# Patient Record
Sex: Female | Born: 1965
Health system: Southern US, Community
[De-identification: ages and names within clinical notes are randomized; demographics above are authoritative.]

## PROBLEM LIST (undated history)

## (undated) DIAGNOSIS — R112 Nausea with vomiting, unspecified: Secondary | ICD-10-CM

## (undated) DIAGNOSIS — R509 Fever, unspecified: Secondary | ICD-10-CM

## (undated) DIAGNOSIS — E7800 Pure hypercholesterolemia, unspecified: Secondary | ICD-10-CM

## (undated) DIAGNOSIS — R519 Headache, unspecified: Secondary | ICD-10-CM

## (undated) DIAGNOSIS — E119 Type 2 diabetes mellitus without complications: Secondary | ICD-10-CM

## (undated) DIAGNOSIS — Z9889 Other specified postprocedural states: Secondary | ICD-10-CM

## (undated) DIAGNOSIS — I10 Essential (primary) hypertension: Secondary | ICD-10-CM

## (undated) DIAGNOSIS — F319 Bipolar disorder, unspecified: Secondary | ICD-10-CM

## (undated) DIAGNOSIS — Z6841 Body Mass Index (BMI) 40.0 and over, adult: Secondary | ICD-10-CM

## (undated) DIAGNOSIS — K219 Gastro-esophageal reflux disease without esophagitis: Secondary | ICD-10-CM

## (undated) DIAGNOSIS — R51 Headache: Secondary | ICD-10-CM

## (undated) DIAGNOSIS — M1990 Unspecified osteoarthritis, unspecified site: Secondary | ICD-10-CM

## (undated) DIAGNOSIS — N904 Leukoplakia of vulva: Secondary | ICD-10-CM

## (undated) HISTORY — DX: Body Mass Index (BMI) 40.0 and over, adult: Z684

## (undated) HISTORY — DX: Leukoplakia of vulva: N90.4

## (undated) HISTORY — DX: Morbid (severe) obesity due to excess calories: E66.01

## (undated) HISTORY — DX: Unspecified osteoarthritis, unspecified site: M19.90

## (undated) HISTORY — PX: WISDOM TOOTH EXTRACTION: SHX21

## (undated) HISTORY — PX: HERNIA REPAIR: SHX51

## (undated) HISTORY — DX: Bipolar disorder, unspecified: F31.9

## (undated) HISTORY — PX: TUBAL LIGATION: SHX77

---

## 1992-05-30 HISTORY — PX: APPENDECTOMY: SHX54

## 1998-01-10 ENCOUNTER — Emergency Department (HOSPITAL_COMMUNITY): Admission: EM | Admit: 1998-01-10 | Discharge: 1998-01-10 | Payer: Self-pay | Admitting: Emergency Medicine

## 2000-02-23 ENCOUNTER — Encounter: Payer: Self-pay | Admitting: Family Medicine

## 2000-02-23 ENCOUNTER — Encounter: Admission: RE | Admit: 2000-02-23 | Discharge: 2000-02-23 | Payer: Self-pay | Admitting: Family Medicine

## 2000-03-08 ENCOUNTER — Other Ambulatory Visit: Admission: RE | Admit: 2000-03-08 | Discharge: 2000-03-22 | Payer: Self-pay | Admitting: Psychiatry

## 2000-05-31 ENCOUNTER — Encounter: Payer: Self-pay | Admitting: Family Medicine

## 2000-05-31 ENCOUNTER — Encounter: Admission: RE | Admit: 2000-05-31 | Discharge: 2000-05-31 | Payer: Self-pay | Admitting: Family Medicine

## 2000-06-07 ENCOUNTER — Encounter: Admission: RE | Admit: 2000-06-07 | Discharge: 2000-06-07 | Payer: Self-pay | Admitting: Family Medicine

## 2000-06-07 ENCOUNTER — Encounter: Payer: Self-pay | Admitting: Family Medicine

## 2002-02-06 ENCOUNTER — Encounter: Payer: Self-pay | Admitting: Emergency Medicine

## 2002-02-06 ENCOUNTER — Emergency Department (HOSPITAL_COMMUNITY): Admission: EM | Admit: 2002-02-06 | Discharge: 2002-02-06 | Payer: Self-pay | Admitting: Emergency Medicine

## 2002-02-11 ENCOUNTER — Encounter: Payer: Self-pay | Admitting: Family Medicine

## 2002-02-11 ENCOUNTER — Ambulatory Visit (HOSPITAL_COMMUNITY): Admission: RE | Admit: 2002-02-11 | Discharge: 2002-02-11 | Payer: Self-pay | Admitting: Family Medicine

## 2002-11-19 ENCOUNTER — Encounter: Admission: RE | Admit: 2002-11-19 | Discharge: 2002-11-19 | Payer: Self-pay | Admitting: Family Medicine

## 2002-11-19 ENCOUNTER — Encounter: Payer: Self-pay | Admitting: Family Medicine

## 2003-07-14 ENCOUNTER — Ambulatory Visit (HOSPITAL_COMMUNITY): Admission: RE | Admit: 2003-07-14 | Discharge: 2003-07-14 | Payer: Self-pay | Admitting: Gastroenterology

## 2004-02-16 ENCOUNTER — Encounter: Admission: RE | Admit: 2004-02-16 | Discharge: 2004-02-16 | Payer: Self-pay | Admitting: Family Medicine

## 2004-02-16 ENCOUNTER — Emergency Department (HOSPITAL_COMMUNITY): Admission: EM | Admit: 2004-02-16 | Discharge: 2004-02-16 | Payer: Self-pay | Admitting: Emergency Medicine

## 2004-03-19 ENCOUNTER — Encounter: Admission: RE | Admit: 2004-03-19 | Discharge: 2004-03-19 | Payer: Self-pay | Admitting: General Surgery

## 2004-04-12 ENCOUNTER — Ambulatory Visit (HOSPITAL_BASED_OUTPATIENT_CLINIC_OR_DEPARTMENT_OTHER): Admission: RE | Admit: 2004-04-12 | Discharge: 2004-04-12 | Payer: Self-pay | Admitting: General Surgery

## 2007-06-21 ENCOUNTER — Emergency Department (HOSPITAL_COMMUNITY): Admission: EM | Admit: 2007-06-21 | Discharge: 2007-06-21 | Payer: Self-pay | Admitting: Emergency Medicine

## 2008-02-28 ENCOUNTER — Ambulatory Visit (HOSPITAL_COMMUNITY): Admission: RE | Admit: 2008-02-28 | Discharge: 2008-02-28 | Payer: Self-pay | Admitting: Obstetrics and Gynecology

## 2008-02-28 ENCOUNTER — Encounter (INDEPENDENT_AMBULATORY_CARE_PROVIDER_SITE_OTHER): Payer: Self-pay | Admitting: Obstetrics and Gynecology

## 2008-09-16 ENCOUNTER — Ambulatory Visit (HOSPITAL_COMMUNITY): Admission: RE | Admit: 2008-09-16 | Discharge: 2008-09-16 | Payer: Self-pay | Admitting: General Surgery

## 2009-01-13 ENCOUNTER — Emergency Department (HOSPITAL_COMMUNITY): Admission: EM | Admit: 2009-01-13 | Discharge: 2009-01-13 | Payer: Self-pay | Admitting: Emergency Medicine

## 2009-01-28 ENCOUNTER — Ambulatory Visit (HOSPITAL_COMMUNITY): Admission: RE | Admit: 2009-01-28 | Discharge: 2009-01-29 | Payer: Self-pay | Admitting: General Surgery

## 2009-03-24 ENCOUNTER — Encounter: Admission: RE | Admit: 2009-03-24 | Discharge: 2009-03-24 | Payer: Self-pay | Admitting: Podiatry

## 2010-01-28 ENCOUNTER — Ambulatory Visit (HOSPITAL_COMMUNITY): Admission: RE | Admit: 2010-01-28 | Discharge: 2010-01-28 | Payer: Self-pay | Admitting: Obstetrics and Gynecology

## 2010-08-12 LAB — CBC
Hemoglobin: 14.1 g/dL (ref 12.0–15.0)
RDW: 12.8 % (ref 11.5–15.5)
WBC: 6.8 10*3/uL (ref 4.0–10.5)

## 2010-09-04 LAB — CBC
Hemoglobin: 13.4 g/dL (ref 12.0–15.0)
MCHC: 33.9 g/dL (ref 30.0–36.0)
MCV: 92.1 fL (ref 78.0–100.0)
Platelets: 338 10*3/uL (ref 150–400)
RBC: 4.31 MIL/uL (ref 3.87–5.11)

## 2010-09-04 LAB — URINE MICROSCOPIC-ADD ON

## 2010-09-04 LAB — URINALYSIS, ROUTINE W REFLEX MICROSCOPIC
Bilirubin Urine: NEGATIVE
Leukocytes, UA: NEGATIVE
Nitrite: NEGATIVE
Protein, ur: NEGATIVE mg/dL

## 2010-10-12 NOTE — Op Note (Signed)
NAMEJILIAN, Suzanne Gutierrez              ACCOUNT NO.:  [id number]   MEDICAL RECORD NO.:  [id number]          PATIENT TYPE:  OIB   LOCATION:  5118                         FACILITY:  [facility]   PHYSICIAN:  Gabrielle Dare. Janee Morn, M.D.DATE OF BIRTH:  1965-09-17   DATE OF PROCEDURE:  01/28/2009  DATE OF DISCHARGE:                               OPERATIVE REPORT   PREOPERATIVE DIAGNOSIS:  Recurrent umbilical hernia.   POSTOPERATIVE DIAGNOSIS:  Recurrent umbilical hernia.   PROCEDURE:  Laparoscopic repair of recurrent umbilical hernia with mesh  using 12-cm Peritek dual.   SURGEON:  Gabrielle Dare. Janee Morn, MD   ASSISTANT:  Magnus Ivan, RNFA   ANESTHESIA:  General endotracheal.   HISTORY OF PRESENT ILLNESS:  [name] is a 45 year old female who  underwent umbilical hernia repair with mesh on April 12, 2004.  Early  this spring, she began having some pain just to the right side of her  previous hernia repair.  I evaluated her in the office and subsequently  obtained CT scan which demonstrated a periumbilical abdominal wall  hernia containing some omentum.  She presents today for laparoscopic  repair of this recurrent hernia with mesh.   PROCEDURE IN DETAIL:  The patient was identified in the preop holding  area.  Her site was identified.  She received intravenous antibiotics.  Informed consent was obtained.  She was brought to the operating room.  General endotracheal anesthesia was administered by the Anesthesia  staff.  A Foley catheter was placed by the nursing staff.  Her abdomen  was prepped and draped in a sterile fashion including an [place].  We did  a time-out procedure.  Marcaine 0.25% with epinephrine was injected in  the left upper lateral abdomen.  Subcutaneous tissues were dissected  down.  We used an OptiView 5-mm port to gain entry to the abdomen very  carefully.  This was done without difficulty.  The abdomen was  insufflated with carbon dioxide in standard fashion.  The scope  was  reinserted in the area where we entered the abdomen was visualized.  There was no apparent injury.  No complications.  Next, under direct  vision, an 11-mm left lower quadrant and a 5-mm right midabdomen lateral  port were placed.  Marcaine 0.25% with epinephrine was used at all port  sites.  Next, the abdomen was explored.  The hernia defect was in the  periumbilical region.  There was omentum up into it, and there was no  bowel.  The omentum was gradually withdrawn and brought down and  hemostasis was obtained in the omentum.  The distal portion was trimmed  off the adhesions up inside and hemostasis was obtained there.  There  was no evidence of chronic infection or other complicating features  aside from the recurrent hernia.  Next, the hernia defect was measured  out using a spinal needle and we chose a 12-cm round Peritek dual mesh  to accomplish the repair, just we will leave at least 4 cm of  circumferential overlap beyond the hernia defect in all direction.  The  4 lengths of 0 Novafil were placed  at 12 o'clock, 3 o'clock, 6 o'clock,  and 9 o'clock.  The mesh was marked.  The mesh was soaked and rolled up  and inserted into the abdomen via  the 11-mm port.  The mesh was  unfurled and orientation was sent.  Next, small nicks were made with a  11-blade scalpel at the corresponding sutures locations and the  EndoCatch was used to pass through the fascia under direct vision and  grabbed each end of the sutures and pulled them up to the abdominal  wall.  A new pass was made for each limb of the suture and this went  fine with exception of the 12 o'clock position when the suture broke  about midway down so that was left in place and just the other arm and  suture were pulled up against the abdominal wall and held with a  hemostat.  Next, all 3 other sutures were tied securely.  The mesh was  then tacked up to the anterior abdominal wall with the Covidien  AbsorbaTack.  Two  concentric rings of tacks were placed giving excellent  lay of the mesh.  It was nice and flat with excellent coverage of the  defect.  Next, a suture passer was used at the 12 o'clock position to  pass a new 0 Novafil down through the Peritek and then a second pass was  used to grasp it and this was tied securely giving additional fixation  to the mass.  Mesh was reinspected and our 2 concentric rings of  AbsorbaTack looked good in good position.  The omentum and rest of the  abdomen were rechecked.  There was no bleeding or other complicating  features.  Pneumoperitoneum was released and ports were removed under  direct vision.  The [place] was removed.  The 3 port sites were closed  with running 4-0 Vicryl subcuticular stitch and then all wounds  including the stab sites for the suture were closed with Dermabond.  Sponge, needle, and instrument counts were all correct.  The patient  tolerated the procedure well without apparent complication.  He was  taken to the recovery in stable condition.      Gabrielle Dare Janee Morn, M.D.  Electronically Signed     BET/MEDQ  D:  01/28/2009  T:  01/29/2009  Job:  [id number]

## 2010-10-12 NOTE — Op Note (Signed)
NAMEKRYSTEL, Suzanne Gutierrez NO.:  [id number]   MEDICAL RECORD NO.:  [id number]          PATIENT TYPE:  AMB   LOCATION:  SDC                           FACILITY:  [facility]   PHYSICIAN:  Malva Limes, M.D.    DATE OF BIRTH:  August 21, 1965   DATE OF PROCEDURE:  02/28/2008  DATE OF DISCHARGE:                               OPERATIVE REPORT   PREOPERATIVE DIAGNOSIS:  Menorrhagia.   POSTOPERATIVE DIAGNOSIS:  Menorrhagia.   PROCEDURES:  1. Dilation and curettage.  2. NovaSure endometrial ablation.   SURGEON:  Malva Limes, MD   ANESTHESIA:  General.   ANTIBIOTICS:  Ancef 1 g.   DRAINS:  Red rubber catheter bladder.   SPECIMENS:  Endometrial curettings sent to Pathology.   ESTIMATED BLOOD LOSS:  Minimal.   COMPLICATIONS:  None.   PROCEDURE:  The patient was taken to the operating room where general  anesthetic was administered without difficulty.  The patient was then  placed in a dorsal lithotomy position.  She was prepped and draped in  the usual fashion for this procedure.  An exam under anesthesia revealed  an anteverted uterus of normal size and shape with no adnexal masses.  A  sterile speculum was placed in the vagina.  12 mL of 1% lidocaine was  used for paracervical block.  A single-tooth tenaculum was applied to  the anterior cervical lip.  The cervix was then serially dilated to a 52-  [place].  The uterus was sounded to 10 cm.  The cervical length was 3.5  cm giving a cavitary length of 6.5 cm.  The sharp curettage was then  performed and tissue sent to Pathology.  Next, the NovaSure device was  placed into the uterine cavity and opened, the width was 4.5 cm.  After  the device was properly placed, the Seal test was performed and passed.  Device was then turned on for a total of 70 seconds using 161 watts  power.  At this point,  the procedure was concluded.  The instruments and device was removed.  The patient was awakened and taken to recovery room in  stable condition.  Instrument and lap counts were correct x1.  The patient will be  discharged to home.  She will be sent home with Percocet to take p.r.n.  She will follow up in the office in 4 weeks.           ______________________________  Malva Limes, M.D.     MA/MEDQ  D:  02/28/2008  T:  02/28/2008  Job:  [id number]

## 2010-10-15 NOTE — Op Note (Signed)
NAME:  Suzanne Gutierrez, Suzanne Gutierrez                        ACCOUNT NO.:  [id number]   MEDICAL RECORD NO.:  [id number]                   PATIENT TYPE:  AMB   LOCATION:  ENDO                                 FACILITY:  [facility]   PHYSICIAN:  James L. Malon Kindle., M.D.          DATE OF BIRTH:  1966/04/13   DATE OF PROCEDURE:  07/14/2003  DATE OF DISCHARGE:                                 OPERATIVE REPORT   PROCEDURE:  Esophagogastroduodenoscopy and biopsy.   ENDOSCOPIST:  Llana Aliment. Randa Evens, M.D.   MEDICATIONS:  Cetacaine spray and Fentanyl 100 mcg and Versed 5 mg IV.   INDICATIONS FOR PROCEDURE:  Right upper quadrant pain.  She has had a  negative CT ultrasound.   DESCRIPTION OF PROCEDURE:  The procedure had been explained to the patient  and consent obtained.  With the patient in the left lateral decubitus  position the Olympus scope was inserted and advanced.  The stomach was  entered first and then passed through the duodenum including the bulb and  second portion which were seen well.  They were normal other than mild  duodenitis of the duodenal bulb.  There were no ulcerations.  The scope was  withdrawn back into the stomach.  The pyloric channel was normal.  No  ulcerations were seen in the stomach.  A biopsy was taken for rapid urease  testing and Helicobacter.  The scope was withdrawn.  The fundus and cardia  were seen in the retroflex view and appeared normal.  There was a hiatal  hernia with a widely patent GE junction.  The distal and proximal esophagus  were endoscopically normal.  The scope was withdrawn.  The patient tolerated  the procedure well.   ASSESSMENT:  Duodenitis (535.00) possibly the cause of her right upper  quadrant pain.   PLAN:  1. We will check the results of the test for Helicobacter.  2. We will continue her on Prevacid.  3. Proceed with colonoscopy at this time.                                               James L. Malon Kindle., M.D.    Waldron Session  D:   07/14/2003  T:  07/14/2003  Job:  [id number]   cc:   S. Kyra Manges, M.D.  [phone] [address]  [place]  [place] [address]  Fax: [phone]   L. Lupe Carney, M.D.  [address] [place]  [place] [address]  Fax: [phone]   Gabriel Earing, M.D.  [address]  [place]  [place] [address]  Fax: [phone]

## 2010-10-15 NOTE — Op Note (Signed)
NAME:  Suzanne Gutierrez, Suzanne Gutierrez                        ACCOUNT NO.:  [id number]   MEDICAL RECORD NO.:  [id number]                   PATIENT TYPE:  AMB   LOCATION:  ENDO                                 FACILITY:  [facility]   PHYSICIAN:  James L. Malon Kindle., M.D.          DATE OF BIRTH:  June 28, 1965   DATE OF PROCEDURE:  07/14/2003  DATE OF DISCHARGE:                                 OPERATIVE REPORT   PROCEDURE:  Colonoscopy.   MEDICATIONS:  The patient received a total of fentanyl 150, Versed 7 mg IV  for both procedures.   INDICATIONS FOR PROCEDURE:  Rectal bleeding. This had originally been  scheduled in the past. She does have a family history of colon polyps.   DESCRIPTION OF PROCEDURE:  The procedure had been explained to the patient  and consented obtained.  With the patient in the left lateral decubitus  position, the Olympus pediatric adjustable scope was inserted following a  digital exam. The prep was quite good. The patient had a very long tortuous  colon.  Multiple maneuvers including placing her in supine right lateral  decubitus position were required as well as abdominal pressure and  eventually we were able to advance down to the cecum and the ileocecal valve  and appendiceal orifice were seen. The scope was withdrawn and the cecum,  ascending colon, transverse colon, descending and sigmoid colon were seen  well with no significant diverticular disease. No polyps were seen  throughout. The scope was withdrawn in the rectum and there were internal  hemorrhoids seen in the rectum.  No polyps were seen in the rectum. The  scope was withdrawn.  The patient tolerated the procedure well.   ASSESSMENT:  1. Rectal bleeding, 578.1.  2. Internal hemorrhoids, 455.0.   PLAN:  Will recommend seeing her in the office in two months.  I will give  her a hemorrhoid instruction sheet and would likely recommend routine  screening colonoscopies at age 45 due to her family history.                                        James L. Malon Kindle., M.D.    Waldron Session  D:  07/14/2003  T:  07/14/2003  Job:  [id number]   cc:   L. Lupe Carney, M.D.  [address] [place]  [place] [address]  Fax: [phone]   S. Kyra Manges, M.D.  [phone] [address]  [place]  [place] [address]  Fax: [phone]   Gabriel Earing, M.D.  [address]  [place]  [place] [address]  Fax: [phone]

## 2010-10-15 NOTE — Op Note (Signed)
Suzanne Gutierrez, Suzanne Gutierrez              ACCOUNT NO.:  [id number]   MEDICAL RECORD NO.:  [id number]          PATIENT TYPE:  AMB   LOCATION:  DSC                          FACILITY:  [facility]   PHYSICIAN:  Gabrielle Dare. Janee Morn, M.D.DATE OF BIRTH:  13-Jul-1965   DATE OF PROCEDURE:  04/12/2004  DATE OF DISCHARGE:                                 OPERATIVE REPORT   PREOPERATIVE DIAGNOSIS:  Umbilical incisional hernia.   POSTOPERATIVE DIAGNOSIS:  Umbilical incisional hernia.   PROCEDURE:  Repair of umbilical incisional hernia with mesh.   SURGEON:  Gabrielle Dare. Janee Morn, M.D.   ANESTHESIA:  General.   ESTIMATED BLOOD LOSS:  Minimal.   INDICATIONS FOR PROCEDURE:  The patient is a 45 year old woman whom I  evaluated in the office for umbilical pain. She has had 2 prevoius  operations involving incisions through her umbilical area including a tubal  ligation which was done laparoscopically and a laparoscopic appendectomy.  The patient was further evaluated with CT scan of the abdomen and pelvis  which demonstrates a small umbilical hernia.  She presents for elective  repair with mesh.   DESCRIPTION OF PROCEDURE:  Informed consent was obtained.  The patient  received IV antibiotics. She was brought to the operating room, general  anesthesia was administered.  Her abdomen was prepped and draped in a  sterile fashion.  A curvilinear, infraumbilical incision was made.  Subcutaneous tissues were dissected down.  The umbilicus was  circumferentially dissected in a blunt fashion and then the umbilical skin  was freed from the underlying tissues carefully with sharp dissection  without damage to the skin.   Once this was accomplished, the defect was quite apparent.  It was about 1  cm in diameter. The hernia content was comprised of omentum. The omentum was  easily reduced back into the abdominal cavity after making sure there was  good hemostasis.  The fascial edge was circumferentially dissected in order  to allow our inlay polypropylene mesh to have 1 cm or greater overlap  circumferentially.  Once this was accomplished with a nice fascial edge, the  polypropylene mesh was fashioned into an oval shape, so as to lay nicely in  an inlay fashion with greater than 1 cm circumferential overlap.  Once this  was accomplished, the mesh was sutured into place with a series of  interrupted 0-Prolene sutures in an inlay fashion.  The mesh unfolded nicely  and excellent repair was obtained.   After the initial stitches were placed, probing around the circumference  revealed a couple of areas that were less strong and some additional sutures  were placed there through the mesh and fascia with excellent closure.  Once  this was accomplished, the area was copiously irrigated. There was no  bleeding.  Please note that prior to the start of the case, 0.5% Marcaine  with epinephrine was injected in the surgical site.  Some additional local  anesthetic was injected at this point.  The umbilical tissues were tacked  down to the underlying fascial area to reconstruct the umbilicus. This was  done with 2 interrupted 2-0 Vicryl sutures.  The subcutaneous tissues were  again irrigated and they were approximated with a series of interrupted 3-0  Vicryl sutures, and skin was closed with running 4-0  Monocryl subcuticular stitch.  Benzoin and Steri-Strips, a cotton ball and a  sterile dressing were applied.  Sponge, instrument and needle counts were  correct.  The patient tolerated the procedure well without apparent  complication, was taken to the recovery room in stable condition.       BET/MEDQ  D:  04/12/2004  T:  04/12/2004  Job:  [id number]

## 2011-02-28 LAB — CBC
Hemoglobin: 12.7
Platelets: 355
RBC: 4.46
WBC: 6.9

## 2011-02-28 LAB — PREGNANCY, URINE: Preg Test, Ur: NEGATIVE

## 2011-03-02 ENCOUNTER — Other Ambulatory Visit: Payer: Self-pay | Admitting: Obstetrics and Gynecology

## 2011-04-09 DIAGNOSIS — J302 Other seasonal allergic rhinitis: Secondary | ICD-10-CM | POA: Insufficient documentation

## 2011-06-07 ENCOUNTER — Emergency Department (HOSPITAL_BASED_OUTPATIENT_CLINIC_OR_DEPARTMENT_OTHER)
Admission: EM | Admit: 2011-06-07 | Discharge: 2011-06-07 | Disposition: A | Discharge status: Home / Self Care | Payer: 59 | Attending: Emergency Medicine | Admitting: Emergency Medicine

## 2011-06-07 ENCOUNTER — Other Ambulatory Visit: Payer: Self-pay

## 2011-06-07 ENCOUNTER — Emergency Department (INDEPENDENT_AMBULATORY_CARE_PROVIDER_SITE_OTHER): Payer: 59

## 2011-06-07 ENCOUNTER — Encounter: Payer: Self-pay | Admitting: *Deleted

## 2011-06-07 DIAGNOSIS — R350 Frequency of micturition: Secondary | ICD-10-CM

## 2011-06-07 DIAGNOSIS — R0602 Shortness of breath: Secondary | ICD-10-CM | POA: Insufficient documentation

## 2011-06-07 DIAGNOSIS — R0789 Other chest pain: Secondary | ICD-10-CM

## 2011-06-07 DIAGNOSIS — R079 Chest pain, unspecified: Secondary | ICD-10-CM

## 2011-06-07 LAB — CBC
HCT: 39.6 % (ref 36.0–46.0)
Hemoglobin: 13.6 g/dL (ref 12.0–15.0)
MCH: 31.2 pg (ref 26.0–34.0)
MCHC: 34.3 g/dL (ref 30.0–36.0)
MCV: 90.8 fL (ref 78.0–100.0)
Platelets: 306 10*3/uL (ref 150–400)
RBC: 4.36 MIL/uL (ref 3.87–5.11)
RDW: 12.6 % (ref 11.5–15.5)

## 2011-06-07 LAB — DIFFERENTIAL
Basophils Absolute: 0 10*3/uL (ref 0.0–0.1)
Eosinophils Absolute: 0.1 10*3/uL (ref 0.0–0.7)
Lymphs Abs: 2.1 10*3/uL (ref 0.7–4.0)
Neutro Abs: 4.5 10*3/uL (ref 1.7–7.7)
Neutrophils Relative %: 61 % (ref 43–77)

## 2011-06-07 LAB — URINALYSIS, ROUTINE W REFLEX MICROSCOPIC
Nitrite: NEGATIVE
Urobilinogen, UA: 0.2 mg/dL (ref 0.0–1.0)

## 2011-06-07 LAB — BASIC METABOLIC PANEL
CO2: 27 mEq/L (ref 19–32)
Calcium: 9.9 mg/dL (ref 8.4–10.5)
GFR calc Af Amer: >90 mL/min (ref >90)
GFR calc non Af Amer: >90 mL/min (ref >90)
Glucose, Bld: 96 mg/dL (ref 70–99)

## 2011-06-07 LAB — URINE MICROSCOPIC-ADD ON

## 2011-06-07 NOTE — ED Provider Notes (Signed)
History     CSN: [id number]  Arrival date & time 06/07/11  1422   First MD Initiated Contact with Patient 06/07/11 1502      Chief Complaint  Patient presents with  . Chest Pain  . Shortness of Breath    (Consider location/radiation/quality/duration/timing/severity/associated sxs/prior treatment) HPI  45yoF is a healthy presents with chest pain and shortness of breath. Patient states that for the past several weeks she has had intermittent chest pain, substernal without radiation. States that 3 hours ago she began to feel similar chest pain. She describes it as a pressure. She states the shortness of breath is present only when the pain is severe. The pain is not worse with exertion or movement. She denies fever, chills, coughing. There no exacerbating or relieving factors. She rates the pain as a 2/10 at this time. She had previously taken ibuprofen for the pain but none today. History of similar. She denies rash. She is nonsmoker. Denies history of hypertension, hyperlipidemia. No family history of coronary artery disease.  Denies h/o VTE in self or family. No recent hosp/surg/immob. No h/o cancer. Denies exogenous hormone use, no leg pain or swelling.   ED Notes, ED Provider Notes from 06/07/11 0000 to 06/07/11 14:31:24       Amy Theotis Barrio, RN 06/07/2011 14:30      Pt amb to room 1 with quick steady gait in nad. Pt reports one month of intermittant chest pain, centralized, no radiation, today developed some sob so wanted to get checked. Pt denies any nausea, diaphoresis or change or increase with activity.   History reviewed. No pertinent past medical history.  History reviewed. No pertinent past surgical history. Surhx- hernia repair x 2 Appendectomy BTL  History reviewed. No pertinent family history.  History  Substance Use Topics  . Smoking status: Never Smoker   . Smokeless tobacco: Not on file  . Alcohol Use: No    OB History    Grav Para Term Preterm Abortions TAB SAB Ect  Mult Living                 Review of Systems except as noted HPI  Allergies  Review of patient's allergies indicates no known allergies.  Home Medications  No current outpatient prescriptions on file.  BP 131/82  Pulse 84  Temp(Src) 98.7 F (37.1 C) (Oral)  Resp 18  SpO2 100%  LMP 05/24/2011  Physical Exam  Nursing note and vitals reviewed. Constitutional: She is oriented to person, place, and time. She appears well-developed.  HENT:  Head: Atraumatic.  Mouth/Throat: Oropharynx is clear and moist.  Eyes: Conjunctivae and EOM are normal. Pupils are equal, round, and reactive to light.  Neck: Normal range of motion. Neck supple.  Cardiovascular: Normal rate, regular rhythm, normal heart sounds and intact distal pulses.   Pulmonary/Chest: Effort normal and breath sounds normal. No respiratory distress. She has no wheezes. She has no rales. She exhibits tenderness.       +sternal ttp, states it reproduces her pain-- tearful with palpation Rt breast exam unremarkable   Abdominal: Soft. She exhibits no distension. There is no tenderness. There is no rebound and no guarding.  Musculoskeletal: Normal range of motion. She exhibits no edema and no tenderness.  Neurological: She is alert and oriented to person, place, and time.  Skin: Skin is warm and dry. No rash noted.  Psychiatric: She has a normal mood and affect.     Date: 06/07/2011  Rate: 67  Rhythm: normal  sinus rhythm  QRS Axis: normal  Intervals: normal  ST/T Wave abnormalities: normal  Conduction Disutrbances:none  Narrative Interpretation:   Old EKG Reviewed: none available   ED Course  Procedures (including critical care time)  Labs Reviewed  URINALYSIS, ROUTINE W REFLEX MICROSCOPIC - Abnormal; Notable for the following:    APPearance CLOUDY (*)    Hgb urine dipstick TRACE (*)    All other components within normal limits  URINE MICROSCOPIC-ADD ON - Abnormal; Notable for the following:    Squamous  Epithelial / LPF FEW (*)    All other components within normal limits  TROPONIN I  BASIC METABOLIC PANEL  CBC  DIFFERENTIAL  TROPONIN I   Dg Chest 2 View  06/07/2011  *RADIOLOGY REPORT*  Clinical Data: Intermittent chest pain, shortness of breath  CHEST - 2 VIEW  Comparison: None.  Findings: Lungs are clear. No pleural effusion or pneumothorax.  Cardiomediastinal silhouette is within normal limits.  Mild degenerative changes of the visualized thoracolumbar spine.  IMPRESSION: No evidence of acute cardiopulmonary disease.  Original Report Authenticated By: Charline Bills, M.D.     1. Atypical chest pain   2. Urinary frequency      MDM  She presents with atypical chest pain. She does have significant tenderness to palpation on exam that reproduces her pain. She is a TIMI 0 and PERC negative. Do not suspect aortic dissection. Her pain is likely musculoskeletal in nature. Will check her labs including cardiac enzymes, EKG unremarkable, chest x-ray. Reassess. Anticipate that she will be with go home with primary care followup for stress test as needed.  Trop negative x 2. Patient now stating that she does have urinary frequency and requesting to have U/A. Pending.  U/A unremarkable. Pt discharged home. F/U with PMD within one week. Tylenol, ibuprofen prn pain          Forbes Cellar, MD 06/07/11 1824

## 2011-06-07 NOTE — ED Provider Notes (Signed)
2:55 PM  Date: 06/07/2011  Rate: 67  Rhythm: normal sinus rhythm  QRS Axis: normal  Intervals: normal  ST/T Wave abnormalities: normal  Conduction Disutrbances:none  Narrative Interpretation: Normal EKG  Old EKG Reviewed: none available    Carleene Cooper III, MD 06/07/11 1456

## 2011-06-07 NOTE — ED Notes (Signed)
Pt amb to room 1 with quick steady gait in nad. Pt reports one month of intermittant chest pain, centralized, no radiation, today developed some sob so wanted to get checked. Pt denies any nausea, diaphoresis or change or increase with activity.

## 2012-09-03 ENCOUNTER — Emergency Department (HOSPITAL_COMMUNITY)
Admission: EM | Admit: 2012-09-03 | Discharge: 2012-09-03 | Disposition: A | Discharge status: Home / Self Care | Payer: 59 | Attending: Emergency Medicine | Admitting: Emergency Medicine

## 2012-09-03 ENCOUNTER — Encounter (HOSPITAL_COMMUNITY): Payer: Self-pay | Admitting: *Deleted

## 2012-09-03 ENCOUNTER — Emergency Department (HOSPITAL_COMMUNITY): Payer: 59

## 2012-09-03 DIAGNOSIS — Z7982 Long term (current) use of aspirin: Secondary | ICD-10-CM | POA: Insufficient documentation

## 2012-09-03 DIAGNOSIS — R0789 Other chest pain: Secondary | ICD-10-CM | POA: Insufficient documentation

## 2012-09-03 DIAGNOSIS — M79609 Pain in unspecified limb: Secondary | ICD-10-CM | POA: Insufficient documentation

## 2012-09-03 DIAGNOSIS — R209 Unspecified disturbances of skin sensation: Secondary | ICD-10-CM | POA: Insufficient documentation

## 2012-09-03 DIAGNOSIS — Z79899 Other long term (current) drug therapy: Secondary | ICD-10-CM | POA: Insufficient documentation

## 2012-09-03 DIAGNOSIS — R110 Nausea: Secondary | ICD-10-CM | POA: Insufficient documentation

## 2012-09-03 LAB — BASIC METABOLIC PANEL
BUN: 6 mg/dL (ref 6–23)
Chloride: 102 mEq/L (ref 96–112)
GFR calc Af Amer: >90 mL/min (ref >90)
Glucose, Bld: 101 mg/dL — ABNORMAL HIGH (ref 70–99)
Potassium: 3.7 mEq/L (ref 3.5–5.1)
Sodium: 138 mEq/L (ref 135–145)

## 2012-09-03 LAB — CBC
HCT: 41.4 % (ref 36.0–46.0)
Hemoglobin: 14.8 g/dL (ref 12.0–15.0)
WBC: 8.3 10*3/uL (ref 4.0–10.5)

## 2012-09-03 LAB — POCT I-STAT TROPONIN I

## 2012-09-03 LAB — TROPONIN I: Troponin I: <0.3 ng/mL (ref <0.30)

## 2012-09-03 LAB — D-DIMER, QUANTITATIVE: D-Dimer, Quant: 0.44 ug/mL-FEU (ref 0.00–0.48)

## 2012-09-03 MED ORDER — ASPIRIN 81 MG PO CHEW
324.0000 mg | CHEWABLE_TABLET | Freq: Once | ORAL | Status: AC
  Administered 2012-09-03: 324 mg via ORAL
  Filled 2012-09-03: qty 4

## 2012-09-03 NOTE — ED Provider Notes (Signed)
History     CSN: [id number]  Arrival date & time 09/03/12  1631   First MD Initiated Contact with Patient 09/03/12 1814      Chief Complaint  Patient presents with  . Chest Pain    (Consider location/radiation/quality/duration/timing/severity/associated sxs/prior treatment) HPI Comments: Patient states she had "acid reflux" last night it progressed to right-sided chest pressure this lasted all day today. It radiates down her right arm is associated with arm pain and tingling and numbness. Mild shortness of breath and pain. Denies any cough or fever. Nausea today without vomiting. No leg pain or swelling. No cardiac history. States negative stress test 2 years ago. Does not smoke. Seen in 2013 similar pain. No weakness in grip strength or dropping objects. Pain seems to radiate from the right neck, shoulder arm.  The history is provided by the patient.    History reviewed. No pertinent past medical history.  Past Surgical History  Procedure Laterality Date  . Hernia repair    . Appendectomy      No family history on file.  History  Substance Use Topics  . Smoking status: Never Smoker   . Smokeless tobacco: Not on file  . Alcohol Use: No    OB History   Grav Para Term Preterm Abortions TAB SAB Ect Mult Living                  Review of Systems  Constitutional: Negative for fever, activity change and appetite change.  HENT: Negative for congestion and rhinorrhea.   Respiratory: Negative for cough, chest tightness and shortness of breath.   Cardiovascular: Positive for chest pain.  Gastrointestinal: Positive for nausea. Negative for vomiting and abdominal pain.  Genitourinary: Negative for dysuria.  Musculoskeletal: Negative for back pain and arthralgias.  Neurological: Negative for dizziness, weakness and headaches.  A complete 10 system review of systems was obtained and all systems are negative except as noted in the HPI and PMH.    Allergies  Codeine  Home  Medications   Current Outpatient Rx  Name  Route  Sig  Dispense  Refill  . aspirin 81 MG tablet   Oral   Take 81 mg by mouth daily as needed for pain. For chest pain         . Fiber CHEW   Oral   Chew 1 tablet by mouth daily.         Marland Kitchen ibuprofen (ADVIL,MOTRIN) 200 MG tablet   Oral   Take 200 mg by mouth every 6 (six) hours as needed for pain. For pain         . Multiple Vitamin (MULTIVITAMIN WITH MINERALS) TABS   Oral   Take 1 tablet by mouth daily.         . Probiotic Product (PROBIOTIC DAILY PO)   Oral   Take 1 tablet by mouth daily.           BP 138/93  Pulse 94  Temp(Src) 98.4 F (36.9 C) (Oral)  Resp 19  SpO2 100%  LMP 08/21/2012  Physical Exam  Constitutional: She is oriented to person, place, and time. She appears well-developed and well-nourished. No distress.  HENT:  Head: Normocephalic and atraumatic.  Mouth/Throat: Oropharynx is clear and moist. No oropharyngeal exudate.  Eyes: Conjunctivae are normal. Pupils are equal, round, and reactive to light.  Neck: Normal range of motion. Neck supple.  Right paracervical muscle pain  Cardiovascular: Normal rate, regular rhythm and normal heart sounds.   No  murmur heard. Pulmonary/Chest: Effort normal and breath sounds normal. No respiratory distress.  Abdominal: Soft. There is no tenderness. There is no rebound and no guarding.  Musculoskeletal: Normal range of motion. She exhibits no edema and no tenderness.  Neurological: She is alert and oriented to person, place, and time. No cranial nerve deficit. Coordination normal.  Equal grip strength bilaterally. Equal push and pull. No pronator drift.    ED Course  Procedures (including critical care time)  Labs Reviewed  BASIC METABOLIC PANEL - Abnormal; Notable for the following:    Glucose, Bld 101 (*)    All other components within normal limits  CBC  D-DIMER, QUANTITATIVE  TROPONIN I  POCT I-STAT TROPONIN I   Dg Chest 2 View  09/03/2012   *RADIOLOGY REPORT*  Clinical Data: Chest pain today  CHEST - 2 VIEW  Comparison: Chest x-ray of 06/07/2011  Findings: No active infiltrate or effusion is seen.  Mediastinal contours are stable.  Minimal peribronchial thickening is noted. The heart is within normal limits in size.  No bony abnormality is seen.  IMPRESSION: No pneumonia.  Mild peribronchial thickening.   Original Report Authenticated By: Dwyane Dee, M.D.      No diagnosis found.    MDM  Atypical chest pain with right arm tingling and pain. EKG nonischemic. Troponin negative. We'll check d-dimer given pain with inspiration.  Question whether right arm pain and tingling is related to cervical radiculopathy. No weakness on exam.  Pain is atypical for ACS. Troponin and d-dimer negative. EKG nonischemic. Patient equal grip strength bilaterally. No foraminal stenosis seen on plain film of the C-spine. Delta troponin negative. Patient stable for outpatient stress test with her Dr. Return precautions discussed.    Date: 09/03/2012  Rate: 96  Rhythm: normal sinus rhythm  QRS Axis: normal  Intervals: normal  ST/T Wave abnormalities: normal  Conduction Disutrbances:none  Narrative Interpretation:   Old EKG Reviewed: unchanged    Glynn Octave, MD 09/03/12 2328

## 2012-09-03 NOTE — ED Notes (Signed)
Pt is here with chest pressure and right arm tingling that started last nite.  Pt states that pain has not eased up.

## 2013-03-11 ENCOUNTER — Other Ambulatory Visit: Payer: Self-pay | Admitting: Obstetrics and Gynecology

## 2013-06-03 ENCOUNTER — Ambulatory Visit (INDEPENDENT_AMBULATORY_CARE_PROVIDER_SITE_OTHER): Payer: 59 | Admitting: Podiatry

## 2013-06-03 ENCOUNTER — Encounter: Payer: Self-pay | Admitting: Podiatry

## 2013-06-03 ENCOUNTER — Ambulatory Visit (INDEPENDENT_AMBULATORY_CARE_PROVIDER_SITE_OTHER): Payer: 59

## 2013-06-03 VITALS — BP 145/99 | HR 91 | Resp 16 | Ht 62.0 in | Wt 210.0 lb

## 2013-06-03 DIAGNOSIS — M722 Plantar fascial fibromatosis: Secondary | ICD-10-CM

## 2013-06-03 DIAGNOSIS — M79609 Pain in unspecified limb: Secondary | ICD-10-CM

## 2013-06-03 DIAGNOSIS — M79671 Pain in right foot: Secondary | ICD-10-CM

## 2013-06-03 MED ORDER — METHYLPREDNISOLONE (PAK) 4 MG PO TABS: ORAL_TABLET | ORAL | Status: DC

## 2013-06-03 MED ORDER — MELOXICAM 15 MG PO TABS: 15.0000 mg | ORAL_TABLET | Freq: Every day | ORAL | Status: DC

## 2013-06-03 NOTE — Progress Notes (Signed)
   Subjective:    Patient ID: Suzanne Gutierrez, female    DOB: 20-Aug-1965, 48 y.o.   MRN: [id number]  HPI Comments: " ive been having left heel pain for 1 1/2 months now, i also want him to check my right foot where he did surgery "     Review of Systems     Objective:   Physical Exam: Vital signs are stable she is alert and oriented x3. Pulses are palpable left lower extremity. She has pain on palpation medial continued tubercle of her left heel. With plantar fasciitis in pes planus. Radiographic evaluation does demonstrate a plantar distally oriented calcaneal heel spur with dramatic flattening of her left foot. This flattening was more than likely associated with excision of plantar fibromas from the plantar aspect of the left foot. Radiographic evaluation does demonstrate plantar distally oriented calcaneal heel spur consistent with plantar fasciitis and soft tissue swelling.        Assessment & Plan:  Assessment: Pes planus with plantar fasciitis left.  Plan: We discussed the etiology pathology conservative versus surgical therapies. We injected her left heel with Kenalog and local anesthetic today put her in a plantar fascial strapping and a night splint. Wrote her prescription for Sterapred Dosepak to be followed by [facility] and discussed appropriate shoe gear stretching exercises and ice therapy. I will followup with her in one month. We will consider surgical repair of her second elevated toe right foot

## 2013-06-03 NOTE — Patient Instructions (Signed)
Plantar Fasciitis (Heel Spur Syndrome) with Rehab The plantar fascia is a fibrous, ligament-like, soft-tissue structure that spans the bottom of the foot. Plantar fasciitis is a condition that causes pain in the foot due to inflammation of the tissue. SYMPTOMS   Pain and tenderness on the underneath side of the foot.  Pain that worsens with standing or walking. CAUSES  Plantar fasciitis is caused by irritation and injury to the plantar fascia on the underneath side of the foot. Common mechanisms of injury include:  Direct trauma to bottom of the foot.  Damage to a small nerve that runs under the foot where the main fascia attaches to the heel bone.  Stress placed on the plantar fascia due to bone spurs. RISK INCREASES WITH:   Activities that place stress on the plantar fascia (running, jumping, pivoting, or cutting).  Poor strength and flexibility.  Improperly fitted shoes.  Tight calf muscles.  Flat feet.  Failure to warm-up properly before activity.  Obesity. PREVENTION  Warm up and stretch properly before activity.  Allow for adequate recovery between workouts.  Maintain physical fitness:  Strength, flexibility, and endurance.  Cardiovascular fitness.  Maintain a health body weight.  Avoid stress on the plantar fascia.  Wear properly fitted shoes, including arch supports for individuals who have flat feet. PROGNOSIS  If treated properly, then the symptoms of plantar fasciitis usually resolve without surgery. However, occasionally surgery is necessary. RELATED COMPLICATIONS   Recurrent symptoms that may result in a chronic condition.  Problems of the lower back that are caused by compensating for the injury, such as limping.  Pain or weakness of the foot during push-off following surgery.  Chronic inflammation, scarring, and partial or complete fascia tear, occurring more often from repeated injections. TREATMENT  Treatment initially involves the use of  ice and medication to help reduce pain and inflammation. The use of strengthening and stretching exercises may help reduce pain with activity, especially stretches of the Achilles tendon. These exercises may be performed at home or with a therapist. Your caregiver may recommend that you use heel cups of arch supports to help reduce stress on the plantar fascia. Occasionally, corticosteroid injections are given to reduce inflammation. If symptoms persist for greater than 6 months despite non-surgical (conservative), then surgery may be recommended.  MEDICATION   If pain medication is necessary, then nonsteroidal anti-inflammatory medications, such as aspirin and ibuprofen, or other minor pain relievers, such as acetaminophen, are often recommended.  Do not take pain medication within 7 days before surgery.  Prescription pain relievers may be given if deemed necessary by your caregiver. Use only as directed and only as much as you need.  Corticosteroid injections may be given by your caregiver. These injections should be reserved for the most serious cases, because they may only be given a certain number of times. HEAT AND COLD  Cold treatment (icing) relieves pain and reduces inflammation. Cold treatment should be applied for 10 to 15 minutes every 2 to 3 hours for inflammation and pain and immediately after any activity that aggravates your symptoms. Use ice packs or massage the area with a piece of ice (ice massage).  Heat treatment may be used prior to performing the stretching and strengthening activities prescribed by your caregiver, physical therapist, or athletic trainer. Use a heat pack or soak the injury in warm water. SEEK IMMEDIATE MEDICAL CARE IF:  Treatment seems to offer no benefit, or the condition worsens.  Any medications produce adverse side effects. EXERCISES RANGE   OF MOTION (ROM) AND STRETCHING EXERCISES - Plantar Fasciitis (Heel Spur Syndrome) These exercises may help you  when beginning to rehabilitate your injury. Your symptoms may resolve with or without further involvement from your physician, physical therapist or athletic trainer. While completing these exercises, remember:   Restoring tissue flexibility helps normal motion to return to the joints. This allows healthier, less painful movement and activity.  An effective stretch should be held for at least 30 seconds.  A stretch should never be painful. You should only feel a gentle lengthening or release in the stretched tissue. RANGE OF MOTION - Toe Extension, Flexion  Sit with your right / left leg crossed over your opposite knee.  Grasp your toes and gently pull them back toward the top of your foot. You should feel a stretch on the bottom of your toes and/or foot.  Hold this stretch for __________ seconds.  Now, gently pull your toes toward the bottom of your foot. You should feel a stretch on the top of your toes and or foot.  Hold this stretch for __________ seconds. Repeat __________ times. Complete this stretch __________ times per day.  RANGE OF MOTION - Ankle Dorsiflexion, Active Assisted  Remove shoes and sit on a chair that is preferably not on a carpeted surface.  Place right / left foot under knee. Extend your opposite leg for support.  Keeping your heel down, slide your right / left foot back toward the chair until you feel a stretch at your ankle or calf. If you do not feel a stretch, slide your bottom forward to the edge of the chair, while still keeping your heel down.  Hold this stretch for __________ seconds. Repeat __________ times. Complete this stretch __________ times per day.  STRETCH  Gastroc, Standing  Place hands on wall.  Extend right / left leg, keeping the front knee somewhat bent.  Slightly point your toes inward on your back foot.  Keeping your right / left heel on the floor and your knee straight, shift your weight toward the wall, not allowing your back to  arch.  You should feel a gentle stretch in the right / left calf. Hold this position for __________ seconds. Repeat __________ times. Complete this stretch __________ times per day. STRETCH  Soleus, Standing  Place hands on wall.  Extend right / left leg, keeping the other knee somewhat bent.  Slightly point your toes inward on your back foot.  Keep your right / left heel on the floor, bend your back knee, and slightly shift your weight over the back leg so that you feel a gentle stretch deep in your back calf.  Hold this position for __________ seconds. Repeat __________ times. Complete this stretch __________ times per day. STRETCH  Gastrocsoleus, Standing  Note: This exercise can place a lot of stress on your foot and ankle. Please complete this exercise only if specifically instructed by your caregiver.   Place the ball of your right / left foot on a step, keeping your other foot firmly on the same step.  Hold on to the wall or a rail for balance.  Slowly lift your other foot, allowing your body weight to press your heel down over the edge of the step.  You should feel a stretch in your right / left calf.  Hold this position for __________ seconds.  Repeat this exercise with a slight bend in your right / left knee. Repeat __________ times. Complete this stretch __________ times per day.    STRENGTHENING EXERCISES - Plantar Fasciitis (Heel Spur Syndrome)  These exercises may help you when beginning to rehabilitate your injury. They may resolve your symptoms with or without further involvement from your physician, physical therapist or athletic trainer. While completing these exercises, remember:   Muscles can gain both the endurance and the strength needed for everyday activities through controlled exercises.  Complete these exercises as instructed by your physician, physical therapist or athletic trainer. Progress the resistance and repetitions only as guided. STRENGTH - Towel  Curls  Sit in a chair positioned on a non-carpeted surface.  Place your foot on a towel, keeping your heel on the floor.  Pull the towel toward your heel by only curling your toes. Keep your heel on the floor.  If instructed by your physician, physical therapist or athletic trainer, add ____________________ at the end of the towel. Repeat __________ times. Complete this exercise __________ times per day. STRENGTH - Ankle Inversion  Secure one end of a rubber exercise band/tubing to a fixed object (table, pole). Loop the other end around your foot just before your toes.  Place your fists between your knees. This will focus your strengthening at your ankle.  Slowly, pull your big toe up and in, making sure the band/tubing is positioned to resist the entire motion.  Hold this position for __________ seconds.  Have your muscles resist the band/tubing as it slowly pulls your foot back to the starting position. Repeat __________ times. Complete this exercises __________ times per day.  Document Released: 05/16/2005 Document Revised: 08/08/2011 Document Reviewed: 08/28/2008 [employer] Patient Information 2014 [employer]. Plantar Fasciitis Plantar fasciitis is a common condition that causes foot pain. It is soreness (inflammation) of the band of tough fibrous tissue on the bottom of the foot that runs from the heel bone (calcaneus) to the ball of the foot. The cause of this soreness may be from excessive standing, poor fitting shoes, running on hard surfaces, being overweight, having an abnormal walk, or overuse (this is common in runners) of the painful foot or feet. It is also common in aerobic exercise dancers and ballet dancers. SYMPTOMS  Most people with plantar fasciitis complain of:  Severe pain in the morning on the bottom of their foot especially when taking the first steps out of bed. This pain recedes after a few minutes of walking.  Severe pain is experienced also during walking  following a long period of inactivity.  Pain is worse when walking barefoot or up stairs DIAGNOSIS   Your caregiver will diagnose this condition by examining and feeling your foot.  Special tests such as X-rays of your foot, are usually not needed. PREVENTION   Consult a sports medicine professional before beginning a new exercise program.  Walking programs offer a good workout. With walking there is a lower chance of overuse injuries common to runners. There is less impact and less jarring of the joints.  Begin all new exercise programs slowly. If problems or pain develop, decrease the amount of time or distance until you are at a comfortable level.  Wear good shoes and replace them regularly.  Stretch your foot and the heel cords at the back of the ankle (Achilles tendon) both before and after exercise.  Run or exercise on even surfaces that are not hard. For example, asphalt is better than pavement.  Do not run barefoot on hard surfaces.  If using a treadmill, vary the incline.  Do not continue to workout if you have foot or joint   problems. Seek professional help if they do not improve. HOME CARE INSTRUCTIONS   Avoid activities that cause you pain until you recover.  Use ice or cold packs on the problem or painful areas after working out.  Only take over-the-counter or prescription medicines for pain, discomfort, or fever as directed by your caregiver.  Soft shoe inserts or athletic shoes with air or gel sole cushions may be helpful.  If problems continue or become more severe, consult a sports medicine caregiver or your own health care provider. Cortisone is a potent anti-inflammatory medication that may be injected into the painful area. You can discuss this treatment with your caregiver. MAKE SURE YOU:   Understand these instructions.  Will watch your condition.  Will get help right away if you are not doing well or get worse. Document Released: 02/08/2001 Document  Revised: 08/08/2011 Document Reviewed: 04/09/2008 [employer] Patient Information 2014 [employer].  

## 2013-07-03 ENCOUNTER — Ambulatory Visit (INDEPENDENT_AMBULATORY_CARE_PROVIDER_SITE_OTHER): Payer: 59 | Admitting: Podiatry

## 2013-07-03 ENCOUNTER — Encounter: Payer: Self-pay | Admitting: Podiatry

## 2013-07-03 VITALS — BP 138/90 | HR 90 | Resp 16 | Ht 62.0 in | Wt 210.0 lb

## 2013-07-03 DIAGNOSIS — M722 Plantar fascial fibromatosis: Secondary | ICD-10-CM

## 2013-07-03 NOTE — Progress Notes (Signed)
Suzanne Gutierrez presents today for followup of her plantar fasciitis of her left foot. She states that the injections and the conservative therapies appear to not be helping her at all. She is requesting surgical intervention like she had on her right foot.  Objective: Vital signs are stable she is alert and oriented x3. There is no erythema edema saline is drainage or odor. Pulses are palpable left. She has pain on direct palpation of the medial continued tubercle of the right heel.  Assessment: Pain in limb secondary to plantar fasciitis left.  Plan: We discussed the etiology pathology conservative versus surgical therapies today at this point an endoscopic plantar fasciotomy was discussed in great detail and she was consented for this procedure. I answered all the questions regarding this procedure to the best of my ability in layman's terms she understood it was amenable to it and signed all 3 pages of the consent form.

## 2013-07-19 ENCOUNTER — Encounter: Payer: Self-pay | Admitting: Podiatry

## 2013-07-19 DIAGNOSIS — M21549 Acquired clubfoot, unspecified foot: Secondary | ICD-10-CM

## 2013-07-23 ENCOUNTER — Telehealth: Payer: Self-pay | Admitting: *Deleted

## 2013-07-23 NOTE — Telephone Encounter (Signed)
Spoke with pt said she is not staying off of foot, is elevating, is taking rx as directed, not using much ice. Pt states she is not in pain and that she is doing good from surgery. i told pt to elevate, stay off of foot, apply ice, take rx as directed and verified pts appt. Pt understood.

## 2013-07-23 NOTE — Progress Notes (Signed)
1. Endoscopic plantar fasciotomy left foot.   rx  percocet 10/325 #50 0 refills take one to two by mouth every 6 - 8 hours as needed for pain Phenergan 25mg  #30 0 refills take one by mouth every 6 -8 hours as needed for nausea Keflex 500 mg #30 0 refills take one by mouth 3 times daily

## 2013-07-25 ENCOUNTER — Encounter: Payer: 59 | Admitting: Podiatry

## 2013-07-30 ENCOUNTER — Encounter: Payer: Self-pay | Admitting: Podiatry

## 2013-07-30 ENCOUNTER — Ambulatory Visit (INDEPENDENT_AMBULATORY_CARE_PROVIDER_SITE_OTHER): Payer: 59 | Admitting: Podiatry

## 2013-07-30 VITALS — BP 150/87 | HR 86 | Resp 12

## 2013-07-30 DIAGNOSIS — Z9889 Other specified postprocedural states: Secondary | ICD-10-CM

## 2013-07-31 NOTE — Progress Notes (Signed)
She presents today a little more than a week status post endoscopic plantar fasciotomy. She denies fever chills nausea vomiting muscle aches and pains. She had to change the dry sterile dressing she states that it bothered her foot. She refers the left foot.  Objective: There is no pain on palpation medial calcaneal tubercle sutures are intact and margins appear to be well coapted  Assessment: Well-healing surgical foot status post 10 days endoscopic plantar fasciotomy left.  Plan: We place Band-Aids over the wounds today in a compression anklet she will followup with [place] in one week for suture removal.

## 2013-08-06 ENCOUNTER — Ambulatory Visit (INDEPENDENT_AMBULATORY_CARE_PROVIDER_SITE_OTHER): Payer: 59 | Admitting: Podiatry

## 2013-08-06 ENCOUNTER — Encounter: Payer: Self-pay | Admitting: Podiatry

## 2013-08-06 VITALS — BP 143/92 | HR 72 | Temp 98.8°F | Resp 16

## 2013-08-06 DIAGNOSIS — Z9889 Other specified postprocedural states: Secondary | ICD-10-CM

## 2013-08-06 NOTE — Progress Notes (Signed)
epf left foot , it started last night getting a little sore.  Objective: Vital signs are stable alert and oriented x3. Sutures are intact margins are well coapted there is no erythema edema cellulitis drainage or odor. Mild tenderness on palpation of the surgical site.  Assessment: Well-healing endoscopic plantar fasciotomy 2 week status post EPF with suture removal today.  Plan: Removal of the sutures today suggested that she start utilizing her night splint at night and will allow her to back into her tennis shoes during the day with her orthotics. I will followup with her in 2 week

## 2013-08-13 ENCOUNTER — Encounter: Payer: 59 | Admitting: Podiatry

## 2013-08-20 ENCOUNTER — Encounter: Payer: Self-pay | Admitting: Podiatry

## 2013-08-20 ENCOUNTER — Ambulatory Visit (INDEPENDENT_AMBULATORY_CARE_PROVIDER_SITE_OTHER): Payer: 59 | Admitting: Podiatry

## 2013-08-20 VITALS — BP 146/91 | HR 73 | Resp 16

## 2013-08-20 DIAGNOSIS — Z9889 Other specified postprocedural states: Secondary | ICD-10-CM

## 2013-08-20 NOTE — Progress Notes (Signed)
Follow up epf left foot, its doing good. She states that I may be doing a little more than I should. She denies fever chills nausea vomiting muscle aches and pains.  Objective: Vital signs are stable she is alert and oriented x3. She has no pain on palpation medial continued tubercle of the left heel. I am not able to feel any scar tissue at all. She does have one small lesion to an old scar distally which does appear to be a plantar fibroma redeveloping. But otherwise her endoscopic plantar fasciotomy appears to be healing nicely.  Assessment: Well-healing endoscopic plantar fasciotomy left.  Plan: Discussed etiology pathology conservative versus surgical therapies I encouraged her to continue to wear shoes with her orthotics as well as to continue the use the night splint at nighttime all followup with her in one month.

## 2013-09-17 ENCOUNTER — Ambulatory Visit (INDEPENDENT_AMBULATORY_CARE_PROVIDER_SITE_OTHER): Payer: 59 | Admitting: Podiatry

## 2013-09-17 ENCOUNTER — Encounter: Payer: Self-pay | Admitting: Podiatry

## 2013-09-17 ENCOUNTER — Telehealth: Payer: Self-pay | Admitting: *Deleted

## 2013-09-17 VITALS — Ht 62.0 in | Wt 210.0 lb

## 2013-09-17 DIAGNOSIS — M722 Plantar fascial fibromatosis: Secondary | ICD-10-CM

## 2013-09-17 DIAGNOSIS — Q6650 Congenital pes planus, unspecified foot: Secondary | ICD-10-CM

## 2013-09-17 MED ORDER — METHYLPREDNISOLONE 4 MG PO KIT: PACK | ORAL | Status: DC

## 2013-09-17 NOTE — Telephone Encounter (Signed)
I saw Dr. Milinda Pointer this morning.  He said he was going to call me in a prescription for Prednisone.  I went to my pharmacy to pick it up it wasn't there.  Can he call that in to [facility] on [address].?

## 2013-09-17 NOTE — Progress Notes (Signed)
She presents today several weeks now status post endoscopic plantar fasciotomy of her left foot. He states that she was doing quite well and has started to develop pain up the side of her foot and she points to the posterior tibial tendon area.   Objective: Pulses are strongly palpable left foot. She has no tenderness on palpation of the plantar fascia or of the scar. She does have tenderness on palpation of the posterior tibial tendon left. Pes planus left.  Assessment posterior tibial tendinitis with posterior tibial tendon dysfunction in pes planus left.  Plan: Discussed etiology pathology conservative versus surgical therapies. We started her on a Sterapred Dosepak and had her scan for a new set of orthotics. And she is also to ice the tendon.

## 2013-09-24 ENCOUNTER — Telehealth: Payer: Self-pay | Admitting: *Deleted

## 2013-09-24 NOTE — Telephone Encounter (Signed)
No.  Have her get back into her cam walker until her next visit with me.

## 2013-09-24 NOTE — Telephone Encounter (Signed)
Saw him last week, he prescribed Prednisone.  I'm out of the Prednisone.  The pain is still present.  Does he want to do another round of the Prednisone?  Give me a call.

## 2013-09-24 NOTE — Telephone Encounter (Signed)
I called and informed her that he said no to the Prednisone.  Start wearing the cam walker again.  She stated I thought he might say that.  She asked if he orthotics were here yet.  I informed her not yet.  She was scanned on 09/17/2013.  I told her they should be here in about 2 weeks.

## 2013-10-31 ENCOUNTER — Emergency Department (HOSPITAL_COMMUNITY)
Admission: EM | Admit: 2013-10-31 | Discharge: 2013-10-31 | Disposition: A | Discharge status: Home / Self Care | Payer: 59 | Attending: Emergency Medicine | Admitting: Emergency Medicine

## 2013-10-31 ENCOUNTER — Emergency Department (HOSPITAL_COMMUNITY): Payer: 59

## 2013-10-31 ENCOUNTER — Encounter (HOSPITAL_COMMUNITY): Payer: Self-pay | Admitting: Emergency Medicine

## 2013-10-31 DIAGNOSIS — I10 Essential (primary) hypertension: Secondary | ICD-10-CM | POA: Insufficient documentation

## 2013-10-31 DIAGNOSIS — R110 Nausea: Secondary | ICD-10-CM | POA: Insufficient documentation

## 2013-10-31 DIAGNOSIS — R002 Palpitations: Secondary | ICD-10-CM | POA: Insufficient documentation

## 2013-10-31 DIAGNOSIS — R079 Chest pain, unspecified: Secondary | ICD-10-CM

## 2013-10-31 DIAGNOSIS — R5383 Other fatigue: Secondary | ICD-10-CM

## 2013-10-31 DIAGNOSIS — K219 Gastro-esophageal reflux disease without esophagitis: Secondary | ICD-10-CM | POA: Insufficient documentation

## 2013-10-31 DIAGNOSIS — R5381 Other malaise: Secondary | ICD-10-CM | POA: Insufficient documentation

## 2013-10-31 HISTORY — DX: Essential (primary) hypertension: I10

## 2013-10-31 LAB — CBC
HCT: 38 % (ref 36.0–46.0)
HEMOGLOBIN: 13.5 g/dL (ref 12.0–15.0)
MCH: 33.1 pg (ref 26.0–34.0)
MCHC: 35.5 g/dL (ref 30.0–36.0)
MCV: 93.1 fL (ref 78.0–100.0)
PLATELETS: 275 10*3/uL (ref 150–400)
RBC: 4.08 MIL/uL (ref 3.87–5.11)
RDW: 12.6 % (ref 11.5–15.5)
WBC: 6.6 10*3/uL (ref 4.0–10.5)

## 2013-10-31 LAB — BASIC METABOLIC PANEL
BUN: 5 mg/dL — ABNORMAL LOW (ref 6–23)
CALCIUM: 8.9 mg/dL (ref 8.4–10.5)
CO2: 23 mEq/L (ref 19–32)
Chloride: 101 mEq/L (ref 96–112)
Creatinine, Ser: 0.67 mg/dL (ref 0.50–1.10)
Glucose, Bld: 137 mg/dL — ABNORMAL HIGH (ref 70–99)
POTASSIUM: 3.8 meq/L (ref 3.7–5.3)
Sodium: 138 mEq/L (ref 137–147)

## 2013-10-31 LAB — I-STAT TROPONIN, ED
TROPONIN I, POC: 0 ng/mL (ref 0.00–0.08)
TROPONIN I, POC: 0 ng/mL (ref 0.00–0.08)

## 2013-10-31 MED ORDER — ASPIRIN 81 MG PO CHEW
324.0000 mg | CHEWABLE_TABLET | Freq: Once | ORAL | Status: AC
  Administered 2013-10-31: 324 mg via ORAL
  Filled 2013-10-31: qty 4

## 2013-10-31 MED ORDER — SUCRALFATE 1 G PO TABS: 1.0000 g | ORAL_TABLET | Freq: Three times a day (TID) | ORAL | Status: DC

## 2013-10-31 NOTE — ED Provider Notes (Signed)
CSN: [id number]     Arrival date & time 10/31/13  1400 History   First MD Initiated Contact with Patient 10/31/13 1514     Chief Complaint  Patient presents with  . Chest Pain     (Consider location/radiation/quality/duration/timing/severity/associated sxs/prior Treatment) HPI Suzanne Gutierrez is a 48 y.o. female who presents to ED with complaint of chest pressure, weakness, left arm heaviness. Pt states she woke up this morning not feeling well. States has had tightness in chest since waking up. States went to work. States there she told her boss she felt bad, so she was sent to an UC where they did ECG and told her it was not completely normal. They tried to set her up for cardiology follow up but their referral person was not in so they sent here here for further treatment. Pt stats she has had CP in the past. Had a negative stress test 3 years ago. States has had increasing acid reflux that is worse after eating and at bed time, feels like burning in the chest and throat. Taking omeprazole, but not consistently. Pt states she has associated nausea. Denies dizziness, diaphoresis. No family hx of CAD, normal cholesterol, does not smoke.   Past Medical History  Diagnosis Date  . Hypertension    Past Surgical History  Procedure Laterality Date  . Hernia repair    . Appendectomy     History reviewed. No pertinent family history. History  Substance Use Topics  . Smoking status: Never Smoker   . Smokeless tobacco: Not on file  . Alcohol Use: No   OB History   Grav Para Term Preterm Abortions TAB SAB Ect Mult Living                 Review of Systems  Constitutional: Negative for fever, chills and diaphoresis.  Respiratory: Positive for chest tightness. Negative for cough and shortness of breath.   Cardiovascular: Positive for palpitations. Negative for chest pain and leg swelling.  Gastrointestinal: Negative for nausea, vomiting, abdominal pain and diarrhea.  Genitourinary: Negative  for dysuria, flank pain and pelvic pain.  Musculoskeletal: Negative for arthralgias.  Skin: Negative for rash.  Neurological: Positive for weakness. Negative for dizziness, light-headedness and headaches.  All other systems reviewed and are negative.     Allergies  Codeine  Home Medications   Prior to Admission medications   Medication Sig Start Date End Date Taking? Authorizing Provider  metoprolol succinate (TOPROL-XL) 50 MG 24 hr tablet Take 50 mg by mouth daily. Take with or immediately following a meal.   Yes Historical Provider, MD   BP 122/88  Pulse 78  Temp(Src) 98 F (36.7 C) (Oral)  Resp 27  Ht 5\' 2"  (1.575 m)  Wt 220 lb (99.791 kg)  BMI 40.23 kg/m2  SpO2 97%  LMP 09/09/2013 Physical Exam  Nursing note and vitals reviewed. Constitutional: She is oriented to person, place, and time. She appears well-developed and well-nourished. No distress.  HENT:  Head: Normocephalic.  Eyes: Conjunctivae are normal.  Neck: Neck supple.  Cardiovascular: Normal rate, regular rhythm and normal heart sounds.   Pulmonary/Chest: Effort normal and breath sounds normal. No respiratory distress. She has no wheezes. She has no rales. She exhibits no tenderness.  Abdominal: Soft. Bowel sounds are normal. She exhibits no distension. There is no tenderness. There is no rebound.  Musculoskeletal: She exhibits no edema.  Neurological: She is alert and oriented to person, place, and time.  Skin: Skin is warm and  dry.  Psychiatric: She has a normal mood and affect. Her behavior is normal.    ED Course  Procedures (including critical care time) Labs Review Labs Reviewed  BASIC METABOLIC PANEL - Abnormal; Notable for the following:    Glucose, Bld 137 (*)    BUN 5 (*)    All other components within normal limits  CBC  I-STAT TROPOININ, ED  Randolm Idol, ED    Imaging Review Dg Chest 2 View  10/31/2013   CLINICAL DATA:  48 year old female with chest pain.  EXAM: CHEST  2 VIEW   COMPARISON:  09/03/2012 and prior chest radiographs  FINDINGS: The cardiomediastinal silhouette is unremarkable.  This is a low volume film.  Mild peribronchial thickening is unchanged.  There is no evidence of focal airspace disease, pulmonary edema, suspicious pulmonary nodule/mass, pleural effusion, or pneumothorax. No acute bony abnormalities are identified.  IMPRESSION: Low volume film without active cardiopulmonary disease.   Electronically Signed   By: Hassan Rowan M.D.   On: 10/31/2013 15:18     EKG Interpretation   Date/Time:  Thursday October 31 2013 14:05:29 EDT Ventricular Rate:  78 PR Interval:  148 QRS Duration: 88 QT Interval:  408 QTC Calculation: 465 R Axis:   73 Text Interpretation:  Normal sinus rhythm Normal ECG No significant change  since last tracing Confirmed by Maryan Rued  MD, Loree Fee ([id number]) on 10/31/2013  3:14:18 PM      MDM   Final diagnoses:  Chest pain    Pt with chest pressure onset this morning. No active pain. No LE swelling. No cardiac hx in the past. Negative stress test 3 years ago. Pain atypical Low risk for CAD. Will get labs, CXR.    Pt's labs unremarkable. VS normal. ECG normal. Pt is TIMI 0, Heart score 1. Low risk for CAD. Delta trop is negative. Pt will be d/c home, follow up with cardiology for stress test and PCP. Will add carafate, pt already taking PPI. She does report some GERD symptoms and it may be the cause of her symptoms. Return precautions given.   Filed Vitals:   10/31/13 1645 10/31/13 1715 10/31/13 1745 10/31/13 1806  BP: 108/70 132/80 125/79 130/82  Pulse: 68 70 72 66  Temp:    97.8 F (36.6 C)  TempSrc:    Oral  Resp: 18 22 19 22   Height:      Weight:      SpO2: 95% 96% 99% 99%       Renold Genta, PA-C 11/01/13 0044

## 2013-10-31 NOTE — ED Notes (Signed)
Pt presents with onset of palpitations, diaphoresis, and nausea since awakening this morning.  Pt reports nausea x 1.5 weeks, reports heaviness to L arm yesterday.  Pt reports going to Urgent Care, thinking her BP was elevated and referred here.

## 2013-10-31 NOTE — Discharge Instructions (Signed)
Your work up today is normal. Continue omeprazole daily. Take carafate as prescribed. Follow up with primary care doctor and with cardiology. Return if worsening.    Chest Pain (Nonspecific) It is often hard to give a specific diagnosis for the cause of chest pain. There is always a chance that your pain could be related to something serious, such as a heart attack or a blood clot in the lungs. You need to follow up with your caregiver for further evaluation. CAUSES   Heartburn.  Pneumonia or bronchitis.  Anxiety or stress.  Inflammation around your heart (pericarditis) or lung (pleuritis or pleurisy).  A blood clot in the lung.  A collapsed lung (pneumothorax). It can develop suddenly on its own (spontaneous pneumothorax) or from injury (trauma) to the chest.  Shingles infection (herpes zoster virus). The chest wall is composed of bones, muscles, and cartilage. Any of these can be the source of the pain.  The bones can be bruised by injury.  The muscles or cartilage can be strained by coughing or overwork.  The cartilage can be affected by inflammation and become sore (costochondritis). DIAGNOSIS  Lab tests or other studies, such as X-rays, electrocardiography, stress testing, or cardiac imaging, may be needed to find the cause of your pain.  TREATMENT   Treatment depends on what may be causing your chest pain. Treatment may include:  Acid blockers for heartburn.  Anti-inflammatory medicine.  Pain medicine for inflammatory conditions.  Antibiotics if an infection is present.  You may be advised to change lifestyle habits. This includes stopping smoking and avoiding alcohol, caffeine, and chocolate.  You may be advised to keep your head raised (elevated) when sleeping. This reduces the chance of acid going backward from your stomach into your esophagus.  Most of the time, nonspecific chest pain will improve within 2 to 3 days with rest and mild pain medicine. HOME CARE  INSTRUCTIONS   If antibiotics were prescribed, take your antibiotics as directed. Finish them even if you start to feel better.  For the next few days, avoid physical activities that bring on chest pain. Continue physical activities as directed.  Do not smoke.  Avoid drinking alcohol.  Only take over-the-counter or prescription medicine for pain, discomfort, or fever as directed by your caregiver.  Follow your caregiver's suggestions for further testing if your chest pain does not go away.  Keep any follow-up appointments you made. If you do not go to an appointment, you could develop lasting (chronic) problems with pain. If there is any problem keeping an appointment, you must call to reschedule. SEEK MEDICAL CARE IF:   You think you are having problems from the medicine you are taking. Read your medicine instructions carefully.  Your chest pain does not go away, even after treatment.  You develop a rash with blisters on your chest. SEEK IMMEDIATE MEDICAL CARE IF:   You have increased chest pain or pain that spreads to your arm, neck, jaw, back, or abdomen.  You develop shortness of breath, an increasing cough, or you are coughing up blood.  You have severe back or abdominal pain, feel nauseous, or vomit.  You develop severe weakness, fainting, or chills.  You have a fever. THIS IS AN EMERGENCY. Do not wait to see if the pain will go away. Get medical help at once. Call your local emergency services (911 in [place] Do not drive yourself to the hospital. MAKE SURE YOU:   Understand these instructions.  Will watch your condition.  Will get help right away if you are not doing well or get worse. Document Released: 02/23/2005 Document Revised: 08/08/2011 Document Reviewed: 12/20/2007 [facility] Patient Information 2014 [place].

## 2013-10-31 NOTE — ED Notes (Signed)
Tatyana, PA at the bedside  

## 2013-11-04 NOTE — ED Provider Notes (Signed)
Medical screening examination/treatment/procedure(s) were performed by non-physician practitioner and as supervising physician I was immediately available for consultation/collaboration.   EKG Interpretation   Date/Time:  Thursday October 31 2013 14:05:29 EDT Ventricular Rate:  78 PR Interval:  148 QRS Duration: 88 QT Interval:  408 QTC Calculation: 465 R Axis:   73 Text Interpretation:  Normal sinus rhythm Normal ECG No significant change  since last tracing Confirmed by Maryan Rued  MD, Loree Fee ([id number]) on 10/31/2013  3:14:18 PM        Blanchie Dessert, MD 11/04/13 2120

## 2013-11-07 ENCOUNTER — Ambulatory Visit (INDEPENDENT_AMBULATORY_CARE_PROVIDER_SITE_OTHER): Payer: 59 | Admitting: Podiatry

## 2013-11-07 ENCOUNTER — Encounter: Payer: Self-pay | Admitting: Podiatry

## 2013-11-07 DIAGNOSIS — M722 Plantar fascial fibromatosis: Secondary | ICD-10-CM

## 2013-11-08 NOTE — Progress Notes (Signed)
She presents today to pick up orthotics. She states that her left heel is still very sore. She is status post EPF.  Objective: Vital signs are stable she is alert and oriented x3 orthotics were evaluated and placed in shoes patient tried the orthotics will followup with her in one month. She still has pain on palpation medial calcaneal tubercle of the left heel.  Assessment: Intractable plantar fasciitis left heel possibly associated with history of excision of plantar fascial secondary to fibroma.  Plan: She will continue use of the orthotics will followup with me in 6-8 weeks if she's not better may need to consider at some point a complete transverse fasciotomy.

## 2013-12-05 ENCOUNTER — Ambulatory Visit: Payer: 59 | Admitting: Podiatry

## 2013-12-31 ENCOUNTER — Ambulatory Visit: Payer: 59 | Admitting: Podiatry

## 2014-03-17 ENCOUNTER — Other Ambulatory Visit: Payer: Self-pay | Admitting: Obstetrics and Gynecology

## 2014-03-19 LAB — CYTOLOGY - PAP

## 2014-05-30 HISTORY — PX: CARDIAC CATHETERIZATION: SHX172

## 2014-10-06 ENCOUNTER — Other Ambulatory Visit (HOSPITAL_COMMUNITY): Payer: Self-pay | Admitting: Obstetrics and Gynecology

## 2015-01-21 ENCOUNTER — Other Ambulatory Visit: Payer: Self-pay | Admitting: Obstetrics and Gynecology

## 2015-03-25 ENCOUNTER — Other Ambulatory Visit: Payer: Self-pay | Admitting: Obstetrics and Gynecology

## 2015-03-26 LAB — CYTOLOGY - PAP

## 2015-05-26 ENCOUNTER — Encounter: Payer: Self-pay | Admitting: Podiatry

## 2015-05-26 ENCOUNTER — Ambulatory Visit: Payer: 59

## 2015-05-26 ENCOUNTER — Ambulatory Visit (INDEPENDENT_AMBULATORY_CARE_PROVIDER_SITE_OTHER): Payer: 59 | Admitting: Podiatry

## 2015-05-26 ENCOUNTER — Ambulatory Visit (INDEPENDENT_AMBULATORY_CARE_PROVIDER_SITE_OTHER): Payer: 59

## 2015-05-26 VITALS — Resp 16

## 2015-05-26 DIAGNOSIS — R52 Pain, unspecified: Secondary | ICD-10-CM | POA: Diagnosis not present

## 2015-05-26 DIAGNOSIS — Z472 Encounter for removal of internal fixation device: Secondary | ICD-10-CM

## 2015-05-26 DIAGNOSIS — M6740 Ganglion, unspecified site: Secondary | ICD-10-CM | POA: Diagnosis not present

## 2015-05-26 DIAGNOSIS — M79671 Pain in right foot: Secondary | ICD-10-CM

## 2015-05-26 NOTE — Patient Instructions (Signed)
Pre-Operative Instructions  Congratulations, you have decided to take an important step to improving your quality of life.  You can be assured that the doctors of [facility] will be with you every step of the way.  1. Plan to be at the surgery center/hospital at least 1 (one) hour prior to your scheduled time unless otherwise directed by the surgical center/hospital staff.  You must have a responsible adult accompany you, remain during the surgery and drive you home.  Make sure you have directions to the surgical center/hospital and know how to get there on time. 2. For hospital based surgery you will need to obtain a history and physical form from your family physician within 1 month prior to the date of surgery- we will give you a form for you primary physician.  3. We make every effort to accommodate the date you request for surgery.  There are however, times where surgery dates or times have to be moved.  We will contact you as soon as possible if a change in schedule is required.   4. No Aspirin/Ibuprofen for one week before surgery.  If you are on aspirin, any non-steroidal anti-inflammatory medications (Mobic, Aleve, Ibuprofen) you should stop taking it 7 days prior to your surgery.  You make take Tylenol  For pain prior to surgery.  5. Medications- If you are taking daily heart and blood pressure medications, seizure, reflux, allergy, asthma, anxiety, pain or diabetes medications, make sure the surgery center/hospital is aware before the day of surgery so they may notify you which medications to take or avoid the day of surgery. 6. No food or drink after midnight the night before surgery unless directed otherwise by surgical center/hospital staff. 7. No alcoholic beverages 24 hours prior to surgery.  No smoking 24 hours prior to or 24 hours after surgery. 8. Wear loose pants or shorts- loose enough to fit over bandages, boots, and casts. 9. No slip on shoes, sneakers are best. 10. Bring  your boot with you to the surgery center/hospital.  Also bring crutches or a walker if your physician has prescribed it for you.  If you do not have this equipment, it will be provided for you after surgery. 11. If you have not been contracted by the surgery center/hospital by the day before your surgery, call to confirm the date and time of your surgery. 12. Leave-time from work may vary depending on the type of surgery you have.  Appropriate arrangements should be made prior to surgery with your employer. 13. Prescriptions will be provided immediately following surgery by your doctor.  Have these filled as soon as possible after surgery and take the medication as directed. 14. Remove nail polish on the operative foot. 15. Wash the night before surgery.  The night before surgery wash the foot and leg well with the antibacterial soap provided and water paying special attention to beneath the toenails and in between the toes.  Rinse thoroughly with water and dry well with a towel.  Perform this wash unless told not to do so by your physician.  Enclosed: 1 Ice pack (please put in freezer the night before surgery)   1 Hibiclens skin cleaner   Pre-op Instructions  If you have any questions regarding the instructions, do not hesitate to call our office.  [place]: [address] [place], [place] [address] [phone]  [place]: [address] , [place] [address] [phone]  [place]: [address]  [place], [place] [address] [phone]  Dr. Richard   Tuchman DPM, Dr. Norman Regal DPM Dr. Richard Sikora DPM, Dr. M. Todd Hyatt DPM, Dr. Kathryn Egerton DPM 

## 2015-05-27 NOTE — Progress Notes (Signed)
She presents today with chief complaint of a soft tissue mass overlying her old incision over the second metatarsophalangeal joint of the right foot. He states that that knuckle is been sore for a while and she noticed the mass, shortly after the soreness began. She states this then lifted proximally month or 2 but denies any trauma.  objective: Vitals signs are stable she is alert and oriented 3. Pulses are palpable. Neurologic sensorium is intact small 1 cm in diameter cyst overlying the second metatarsophalangeal joint right foot. This is freely movable and is more than likely a ganglion cyst. Radiographs confirm soft tissue mass as well as screw to the second metatarsal of the right foot.  Assessment: Pain in limb secondary to painful internal fixation right second metatarsal. Ganglion cyst dorsal aspect second metatarsophalangeal joint.  Plan: We consented her for surgery today for removal of the cyst and internal fixation. She signed a 3 patient's consent form she understands the pros and cons of this and she's had surgery before she also understands possible complications. Follow-up with her in a few weeks.

## 2015-06-04 ENCOUNTER — Other Ambulatory Visit: Payer: Self-pay | Admitting: Podiatry

## 2015-06-04 MED ORDER — PROMETHAZINE HCL 25 MG PO TABS: 25.0000 mg | ORAL_TABLET | Freq: Three times a day (TID) | ORAL | Status: DC | PRN

## 2015-06-04 MED ORDER — MEPERIDINE HCL 50 MG PO TABS: ORAL_TABLET | ORAL | Status: DC

## 2015-06-04 MED ORDER — CEPHALEXIN 500 MG PO CAPS: 500.0000 mg | ORAL_CAPSULE | Freq: Three times a day (TID) | ORAL | Status: DC

## 2015-06-05 DIAGNOSIS — Z4889 Encounter for other specified surgical aftercare: Secondary | ICD-10-CM | POA: Diagnosis not present

## 2015-06-05 DIAGNOSIS — M6740 Ganglion, unspecified site: Secondary | ICD-10-CM | POA: Diagnosis not present

## 2015-06-11 ENCOUNTER — Encounter: Payer: Self-pay | Admitting: Podiatry

## 2015-06-11 ENCOUNTER — Ambulatory Visit (INDEPENDENT_AMBULATORY_CARE_PROVIDER_SITE_OTHER): Payer: 59

## 2015-06-11 ENCOUNTER — Ambulatory Visit (INDEPENDENT_AMBULATORY_CARE_PROVIDER_SITE_OTHER): Payer: 59 | Admitting: Podiatry

## 2015-06-11 VITALS — BP 107/79 | HR 84 | Resp 16

## 2015-06-11 DIAGNOSIS — Z472 Encounter for removal of internal fixation device: Secondary | ICD-10-CM

## 2015-06-11 DIAGNOSIS — Z9889 Other specified postprocedural states: Secondary | ICD-10-CM | POA: Diagnosis not present

## 2015-06-11 NOTE — Progress Notes (Signed)
She presents today 1 week status post removal of deep internal fixation second metatarsal right foot with excision of a ganglion cyst right foot. States that is a little sore.  Objective: Vital signs are stable alert and oriented 3. Pulses are palpable. No erythema mild edema no cellulitis drainage or odor. Sutures are in place margins are coapted. X-ray confirms removal of internal fixation.  Assessment: Healing surgical foot.  Plan: [place] today with a Band-Aid I will allow her start to get this wet in the shower than soaking in Epsom salt and water afterwards we will follow-up with her in 1 week for a suture removal.

## 2015-06-18 ENCOUNTER — Encounter: Payer: Self-pay | Admitting: Podiatry

## 2015-06-18 ENCOUNTER — Ambulatory Visit (INDEPENDENT_AMBULATORY_CARE_PROVIDER_SITE_OTHER): Payer: 59 | Admitting: Podiatry

## 2015-06-18 VITALS — BP 121/78 | HR 93 | Resp 16

## 2015-06-18 DIAGNOSIS — Z9889 Other specified postprocedural states: Secondary | ICD-10-CM

## 2015-06-18 DIAGNOSIS — Z472 Encounter for removal of internal fixation device: Secondary | ICD-10-CM

## 2015-06-20 NOTE — Progress Notes (Signed)
She presents today 2 weeks status post excision ganglion cyst dorsal aspect right foot with removal screw second metatarsal right foot. She states this seems to be doing okay but it's a little bit sore.  Objective: Vital signs are stable she is alert and oriented 3 small incision dorsal aspect of the second metatarsophalangeal joint appears to be healing well margins are well coapted we removed the sutures today margins remain well coapted and there is no sign of infection.  Assessment: Well-healing surgical foot right.  Plan: Follow up with [place] in 2-4 weeks. I will allow her to get back to her regular shoes. She may start washing this and applying lotion. She will notify us with any questions or concerns.

## 2015-07-16 ENCOUNTER — Encounter: Payer: 59 | Admitting: Podiatry

## 2015-08-17 ENCOUNTER — Emergency Department (HOSPITAL_COMMUNITY): Payer: 59

## 2015-08-17 ENCOUNTER — Encounter (HOSPITAL_COMMUNITY): Payer: Self-pay | Admitting: Emergency Medicine

## 2015-08-17 ENCOUNTER — Emergency Department (HOSPITAL_COMMUNITY)
Admission: EM | Admit: 2015-08-17 | Discharge: 2015-08-17 | Disposition: A | Discharge status: Home / Self Care | Payer: 59 | Attending: Emergency Medicine | Admitting: Emergency Medicine

## 2015-08-17 DIAGNOSIS — Z3202 Encounter for pregnancy test, result negative: Secondary | ICD-10-CM | POA: Diagnosis not present

## 2015-08-17 DIAGNOSIS — R1110 Vomiting, unspecified: Secondary | ICD-10-CM | POA: Insufficient documentation

## 2015-08-17 DIAGNOSIS — R102 Pelvic and perineal pain: Secondary | ICD-10-CM | POA: Insufficient documentation

## 2015-08-17 DIAGNOSIS — Z79899 Other long term (current) drug therapy: Secondary | ICD-10-CM | POA: Insufficient documentation

## 2015-08-17 DIAGNOSIS — I10 Essential (primary) hypertension: Secondary | ICD-10-CM | POA: Diagnosis not present

## 2015-08-17 DIAGNOSIS — R1032 Left lower quadrant pain: Secondary | ICD-10-CM

## 2015-08-17 DIAGNOSIS — M545 Low back pain: Secondary | ICD-10-CM | POA: Diagnosis not present

## 2015-08-17 DIAGNOSIS — R197 Diarrhea, unspecified: Secondary | ICD-10-CM | POA: Insufficient documentation

## 2015-08-17 LAB — URINALYSIS, ROUTINE W REFLEX MICROSCOPIC
BILIRUBIN URINE: NEGATIVE
GLUCOSE, UA: NEGATIVE mg/dL
KETONES UR: NEGATIVE mg/dL
Leukocytes, UA: NEGATIVE
Nitrite: NEGATIVE
Protein, ur: NEGATIVE mg/dL
Specific Gravity, Urine: 1.019 (ref 1.005–1.030)
pH: 5 (ref 5.0–8.0)

## 2015-08-17 LAB — URINE MICROSCOPIC-ADD ON: WBC, UA: NONE SEEN WBC/hpf (ref 0–5)

## 2015-08-17 LAB — CBC
HEMATOCRIT: 41.6 % (ref 36.0–46.0)
Hemoglobin: 13.9 g/dL (ref 12.0–15.0)
MCH: 31.4 pg (ref 26.0–34.0)
MCHC: 33.4 g/dL (ref 30.0–36.0)
MCV: 94.1 fL (ref 78.0–100.0)
Platelets: 254 10*3/uL (ref 150–400)
RBC: 4.42 MIL/uL (ref 3.87–5.11)
RDW: 12.4 % (ref 11.5–15.5)
WBC: 6.2 10*3/uL (ref 4.0–10.5)

## 2015-08-17 LAB — COMPREHENSIVE METABOLIC PANEL
ALBUMIN: 3.7 g/dL (ref 3.5–5.0)
ALT: 23 U/L (ref 14–54)
AST: 19 U/L (ref 15–41)
Alkaline Phosphatase: 57 U/L (ref 38–126)
Anion gap: 11 (ref 5–15)
BUN: 6 mg/dL (ref 6–20)
CHLORIDE: 103 mmol/L (ref 101–111)
CO2: 26 mmol/L (ref 22–32)
Calcium: 9 mg/dL (ref 8.9–10.3)
Creatinine, Ser: 0.81 mg/dL (ref 0.44–1.00)
GFR calc Af Amer: >60 mL/min (ref >60)
GFR calc non Af Amer: >60 mL/min (ref >60)
Glucose, Bld: 125 mg/dL — ABNORMAL HIGH (ref 65–99)
POTASSIUM: 3.9 mmol/L (ref 3.5–5.1)
Sodium: 140 mmol/L (ref 135–145)
Total Bilirubin: 0.4 mg/dL (ref 0.3–1.2)
Total Protein: 6.7 g/dL (ref 6.5–8.1)

## 2015-08-17 LAB — POC URINE PREG, ED: Preg Test, Ur: NEGATIVE

## 2015-08-17 LAB — LIPASE, BLOOD: Lipase: 28 U/L (ref 11–51)

## 2015-08-17 LAB — WET PREP, GENITAL
Clue Cells Wet Prep HPF POC: NONE SEEN
SPERM: NONE SEEN
Trich, Wet Prep: NONE SEEN
Yeast Wet Prep HPF POC: NONE SEEN

## 2015-08-17 MED ORDER — ONDANSETRON HCL 4 MG/2ML IJ SOLN
4.0000 mg | Freq: Once | INTRAMUSCULAR | Status: AC
  Administered 2015-08-17: 4 mg via INTRAVENOUS
  Filled 2015-08-17: qty 2

## 2015-08-17 MED ORDER — MORPHINE SULFATE 15 MG PO TABS: 15.0000 mg | ORAL_TABLET | ORAL | Status: DC | PRN

## 2015-08-17 MED ORDER — SODIUM CHLORIDE 0.9 % IV BOLUS (SEPSIS)
1000.0000 mL | Freq: Once | INTRAVENOUS | Status: AC
Start: 2015-08-17 — End: 2015-08-17
  Administered 2015-08-17: 1000 mL via INTRAVENOUS

## 2015-08-17 MED ORDER — NAPROXEN 500 MG PO TABS: 500.0000 mg | ORAL_TABLET | Freq: Two times a day (BID) | ORAL | Status: DC

## 2015-08-17 MED ORDER — KETOROLAC TROMETHAMINE 30 MG/ML IJ SOLN
30.0000 mg | Freq: Once | INTRAMUSCULAR | Status: AC
  Administered 2015-08-17: 30 mg via INTRAVENOUS
  Filled 2015-08-17: qty 1

## 2015-08-17 MED ORDER — ONDANSETRON 4 MG PO TBDP
8.0000 mg | ORAL_TABLET | Freq: Once | ORAL | Status: AC
  Administered 2015-08-17: 8 mg via ORAL
  Filled 2015-08-17: qty 2

## 2015-08-17 MED ORDER — MORPHINE SULFATE (PF) 4 MG/ML IV SOLN: 4.0000 mg | Freq: Once | INTRAVENOUS | Status: DC | PRN

## 2015-08-17 NOTE — Discharge Instructions (Signed)

## 2015-08-17 NOTE — ED Notes (Signed)
Pt oob to br with husband at side. Gait steady.

## 2015-08-17 NOTE — ED Notes (Signed)
C/o sharp constant LLQ/pelvic pain, L lower back pain, nausea, vomiting, and diarrhea since Saturday. Denies urinary complaints.

## 2015-08-17 NOTE — ED Provider Notes (Signed)
CSN: [id number]     Arrival date & time 08/17/15  0455 History   First MD Initiated Contact with Patient 08/17/15 [phone]     Chief Complaint  Patient presents with  . Abdominal Pain  . Back Pain  . Emesis  . Diarrhea     (Consider location/radiation/quality/duration/timing/severity/associated sxs/prior Treatment) HPI   Social 50 year old female who presents the emergency department with chief complaint of left lower quadrant abdominal pain and flank pain. Patient states that the pain began on Saturday morning 3 days ago. She states at first she had left flank pain which was mild and easily relieved with Motrin. Yesterday morning she awoke again with the same pain and took Motrin. She said that she began to have a bit of pain in the left lower quadrant of the abdomen. Still easily controlled with OTC NSAIDs. Patient states around 1 AM this morning she awoke with severe flank and left lower quadrant pain which she describes as constant, sharp. She's had associated pain and vomiting. She denies any urinary symptoms. The patient did begin her menstrual period yesterday. She states she is perimenopausal and only gets it intermittently. Bleeding has been minimal. She had something similar last month during her period was evaluated in urgent care, had a pelvic ultrasound that showed cysts on the left ovary. She states that the pain is somewhat similar to last month. However, far worse. She is monogamous with her husband.  Past Medical History  Diagnosis Date  . Hypertension    Past Surgical History  Procedure Laterality Date  . Hernia repair    . Appendectomy     No family history on file. Social History  Substance Use Topics  . Smoking status: Never Smoker   . Smokeless tobacco: None  . Alcohol Use: Yes   OB History    No data available     Review of Systems  Ten systems reviewed and are negative for acute change, except as noted in the HPI.    Allergies  Codeine  Home  Medications   Prior to Admission medications   Medication Sig Start Date End Date Taking? Authorizing Provider  ibuprofen (ADVIL,MOTRIN) 200 MG tablet Take 600 mg by mouth every 6 (six) hours as needed for moderate pain.   Yes Historical Provider, MD  meperidine (DEMEROL) 50 MG tablet Take one to two capsules by mouth every six to eight hours as needed for pain. 06/04/15  Yes Max T Hyatt, DPM  metoprolol succinate (TOPROL-XL) 50 MG 24 hr tablet Take 50 mg by mouth daily. Take with or immediately following a meal.   Yes Historical Provider, MD  oxyCODONE-acetaminophen (PERCOCET) 10-325 MG tablet Take 0.5 tablets by mouth every 4 (four) hours as needed for pain.   Yes Historical Provider, MD  promethazine (PHENERGAN) 25 MG tablet Take 1 tablet (25 mg total) by mouth every 8 (eight) hours as needed for nausea or vomiting. 06/04/15  Yes Max T Hyatt, DPM  zolpidem (AMBIEN) 10 MG tablet Take 5 mg by mouth at bedtime as needed for sleep.   Yes Historical Provider, MD   BP 142/103 mmHg  Pulse 77  Temp(Src) 97.4 F (36.3 C) (Oral)  Resp 17  Ht 5\' 2"  (1.575 m)  Wt 95.255 kg  BMI 38.40 kg/m2  SpO2 99%  LMP 08/16/2015 Physical Exam Physical Exam  Nursing note and vitals reviewed. Constitutional: She is oriented to person, place, and time. She appears well-developed and well-nourished. No distress.  HENT:  Head: Normocephalic and atraumatic.  Eyes: Conjunctivae normal and EOM are normal. Pupils are equal, round, and reactive to light. No scleral icterus.  Neck: Normal range of motion.  Cardiovascular: Normal rate, regular rhythm and normal heart sounds.  Exam reveals no gallop and no friction rub.   No murmur heard. Pulmonary/Chest: Effort normal and breath sounds normal. No respiratory distress.  Abdominal: Obese abdomen. Exam is limited due to body habitus. Pain to palpation. The left lower quadrant of the abdomen. Negative CVA tenderness.  Neurological: She is alert and oriented to person, place,  and time.  Skin: Skin is warm and dry. She is not diaphoretic.    ED Course  Procedures (including critical care time) Labs Review Labs Reviewed  COMPREHENSIVE METABOLIC PANEL - Abnormal; Notable for the following:    Glucose, Bld 125 (*)    All other components within normal limits  URINALYSIS, ROUTINE W REFLEX MICROSCOPIC (NOT AT [facility]) - Abnormal; Notable for the following:    APPearance CLOUDY (*)    Hgb urine dipstick LARGE (*)    All other components within normal limits  URINE MICROSCOPIC-ADD ON - Abnormal; Notable for the following:    Squamous Epithelial / LPF 0-5 (*)    Bacteria, UA RARE (*)    All other components within normal limits  LIPASE, BLOOD  CBC  POC URINE PREG, ED    Imaging Review No results found. I have personally reviewed and evaluated these images and lab results as part of my medical decision-making.   EKG Interpretation None      MDM   Final diagnoses:  Pelvic pain in female    Patient with LLQ abd pain. ? Kidney stones.  Labs are unremarkable, however given hx will need R/o.     10:14 AM BP 137/88 mmHg  Pulse 69  Temp(Src) 97.4 F (36.3 C) (Oral)  Resp 16  Ht 5\' 2"  (1.575 m)  Wt 95.255 kg  BMI 38.40 kg/m2  SpO2 96%  LMP 08/16/2015 Patient with negative ct.  Pelvic exam: VULVA: normal appearing vulva with no masses, tenderness or lesions, VAGINA: normal appearing vagina with normal color and discharge, no lesions, CERVIX: normal appearing cervix without discharge or lesions, UTERUS: limited exam due to body habits, ADNEXA: Tenderness bl Diffuse mild pain on pelvic examination. Will obtain pelvic US r/o torsion.   1:00 PM BP 122/80 mmHg  Pulse 93  Temp(Src) 97.4 F (36.3 C) (Oral)  Resp 16  Ht 5\' 2"  (1.575 m)  Wt 95.255 kg  BMI 38.40 kg/m2  SpO2 95%  LMP 08/16/2015 PatientPelvic ultrasound with fluid collection in the endometrial canal. I spoke with Dr. Smith Robert, who read the ultrasound. He is concerned for hematometra  which may be a complication of her previous ablations. I spoke with Dr. Carlis Abbott who is on call for OB/GYN. Did see bleeding from the cervical os. On my examination, and I noted she is able to expel some bleeding from the uterus. Dr. Carlis Abbott and I both feel she is very low risk for infection. She has diffuse pain on the examination, which is mild to moderate. No sign of torsion. Patient is instructed by Dr. Carlis Abbott for follow-up in 2-4 weeks with her OB/GYN provider. Discussed the findings and consultation discussions with the patient who expresses understanding and agrees with the plan of care. Her blood pressure has decreased over the course as her pain has become much better control. Patient will be discharged with pain medication, anti-inflammatories and OB follow-up. I discussed return precautions with the patient.  She appears safe for discharge at this time  Margarita Mail, PA-C 08/17/15 [place], MD 08/19/15 [phone]

## 2015-08-17 NOTE — ED Notes (Signed)
Paged Dr. Carlis Abbott to [phone]

## 2015-08-17 NOTE — ED Notes (Signed)
Pt and husband verbalize understanding of instructions.

## 2015-08-18 LAB — URINE CULTURE

## 2015-08-18 LAB — GC/CHLAMYDIA PROBE AMP (~~LOC~~) NOT AT ARMC
CHLAMYDIA, DNA PROBE: NEGATIVE
NEISSERIA GONORRHEA: NEGATIVE

## 2015-11-16 ENCOUNTER — Other Ambulatory Visit: Payer: Self-pay | Admitting: Physician Assistant

## 2015-11-16 DIAGNOSIS — R079 Chest pain, unspecified: Secondary | ICD-10-CM

## 2015-11-16 DIAGNOSIS — K219 Gastro-esophageal reflux disease without esophagitis: Secondary | ICD-10-CM

## 2016-02-04 ENCOUNTER — Other Ambulatory Visit: Payer: Self-pay | Admitting: Obstetrics and Gynecology

## 2016-05-03 ENCOUNTER — Other Ambulatory Visit: Payer: Self-pay | Admitting: Obstetrics and Gynecology

## 2016-05-04 LAB — CYTOLOGY - PAP

## 2016-07-20 DIAGNOSIS — B349 Viral infection, unspecified: Secondary | ICD-10-CM | POA: Diagnosis not present

## 2016-07-20 DIAGNOSIS — R509 Fever, unspecified: Secondary | ICD-10-CM | POA: Diagnosis not present

## 2016-09-01 DIAGNOSIS — R51 Headache: Secondary | ICD-10-CM | POA: Diagnosis not present

## 2016-09-08 ENCOUNTER — Other Ambulatory Visit: Payer: Self-pay | Admitting: Physician Assistant

## 2016-09-08 ENCOUNTER — Ambulatory Visit
Admission: RE | Admit: 2016-09-08 | Discharge: 2016-09-08 | Disposition: A | Discharge status: Home / Self Care | Payer: 59 | Source: Ambulatory Visit | Attending: Physician Assistant | Admitting: Physician Assistant

## 2016-09-08 DIAGNOSIS — G4459 Other complicated headache syndrome: Secondary | ICD-10-CM

## 2016-09-08 DIAGNOSIS — R51 Headache: Secondary | ICD-10-CM | POA: Diagnosis not present

## 2016-10-10 DIAGNOSIS — Z0000 Encounter for general adult medical examination without abnormal findings: Secondary | ICD-10-CM | POA: Diagnosis not present

## 2016-10-10 DIAGNOSIS — I10 Essential (primary) hypertension: Secondary | ICD-10-CM | POA: Diagnosis not present

## 2016-10-10 DIAGNOSIS — R51 Headache: Secondary | ICD-10-CM | POA: Diagnosis not present

## 2016-11-17 ENCOUNTER — Other Ambulatory Visit: Payer: Self-pay

## 2016-11-17 DIAGNOSIS — D2262 Melanocytic nevi of left upper limb, including shoulder: Secondary | ICD-10-CM | POA: Diagnosis not present

## 2016-11-17 DIAGNOSIS — D485 Neoplasm of uncertain behavior of skin: Secondary | ICD-10-CM | POA: Diagnosis not present

## 2016-11-17 DIAGNOSIS — L659 Nonscarring hair loss, unspecified: Secondary | ICD-10-CM | POA: Diagnosis not present

## 2016-11-17 DIAGNOSIS — L814 Other melanin hyperpigmentation: Secondary | ICD-10-CM | POA: Diagnosis not present

## 2016-11-17 DIAGNOSIS — L639 Alopecia areata, unspecified: Secondary | ICD-10-CM | POA: Diagnosis not present

## 2016-11-28 DIAGNOSIS — K219 Gastro-esophageal reflux disease without esophagitis: Secondary | ICD-10-CM | POA: Diagnosis not present

## 2016-11-28 DIAGNOSIS — R1314 Dysphagia, pharyngoesophageal phase: Secondary | ICD-10-CM | POA: Diagnosis not present

## 2016-11-28 DIAGNOSIS — K625 Hemorrhage of anus and rectum: Secondary | ICD-10-CM | POA: Diagnosis not present

## 2017-01-02 DIAGNOSIS — R1310 Dysphagia, unspecified: Secondary | ICD-10-CM | POA: Diagnosis not present

## 2017-01-02 DIAGNOSIS — K222 Esophageal obstruction: Secondary | ICD-10-CM | POA: Diagnosis not present

## 2017-01-02 DIAGNOSIS — K921 Melena: Secondary | ICD-10-CM | POA: Diagnosis not present

## 2017-01-09 ENCOUNTER — Inpatient Hospital Stay (HOSPITAL_COMMUNITY)
Admission: AD | Admit: 2017-01-09 | Discharge: 2017-01-09 | Disposition: A | Discharge status: Home / Self Care | Payer: 59 | Source: Ambulatory Visit | Attending: Obstetrics and Gynecology | Admitting: Obstetrics and Gynecology

## 2017-01-09 ENCOUNTER — Inpatient Hospital Stay (HOSPITAL_COMMUNITY): Payer: 59

## 2017-01-09 ENCOUNTER — Encounter (HOSPITAL_COMMUNITY): Payer: Self-pay | Admitting: *Deleted

## 2017-01-09 DIAGNOSIS — N83202 Unspecified ovarian cyst, left side: Secondary | ICD-10-CM | POA: Diagnosis not present

## 2017-01-09 DIAGNOSIS — N83292 Other ovarian cyst, left side: Secondary | ICD-10-CM | POA: Diagnosis not present

## 2017-01-09 DIAGNOSIS — N950 Postmenopausal bleeding: Secondary | ICD-10-CM | POA: Diagnosis not present

## 2017-01-09 DIAGNOSIS — I10 Essential (primary) hypertension: Secondary | ICD-10-CM | POA: Insufficient documentation

## 2017-01-09 DIAGNOSIS — R1031 Right lower quadrant pain: Secondary | ICD-10-CM | POA: Diagnosis not present

## 2017-01-09 DIAGNOSIS — M545 Low back pain: Secondary | ICD-10-CM | POA: Diagnosis not present

## 2017-01-09 DIAGNOSIS — K219 Gastro-esophageal reflux disease without esophagitis: Secondary | ICD-10-CM | POA: Diagnosis not present

## 2017-01-09 DIAGNOSIS — G8929 Other chronic pain: Secondary | ICD-10-CM | POA: Diagnosis not present

## 2017-01-09 DIAGNOSIS — Z885 Allergy status to narcotic agent status: Secondary | ICD-10-CM | POA: Diagnosis not present

## 2017-01-09 DIAGNOSIS — R102 Pelvic and perineal pain: Secondary | ICD-10-CM

## 2017-01-09 HISTORY — DX: Gastro-esophageal reflux disease without esophagitis: K21.9

## 2017-01-09 HISTORY — DX: Headache, unspecified: R51.9

## 2017-01-09 HISTORY — DX: Headache: R51

## 2017-01-09 LAB — URINALYSIS, ROUTINE W REFLEX MICROSCOPIC
Bilirubin Urine: NEGATIVE
GLUCOSE, UA: NEGATIVE mg/dL
Ketones, ur: NEGATIVE mg/dL
Leukocytes, UA: NEGATIVE
NITRITE: NEGATIVE
PH: 5 (ref 5.0–8.0)
PROTEIN: NEGATIVE mg/dL
SPECIFIC GRAVITY, URINE: 1.018 (ref 1.005–1.030)

## 2017-01-09 LAB — CBC WITH DIFFERENTIAL/PLATELET
BASOS PCT: 0 %
Basophils Absolute: 0 10*3/uL (ref 0.0–0.1)
Eosinophils Absolute: 0.2 10*3/uL (ref 0.0–0.7)
Eosinophils Relative: 3 %
HEMATOCRIT: 41.1 % (ref 36.0–46.0)
HEMOGLOBIN: 13.9 g/dL (ref 12.0–15.0)
LYMPHS PCT: 39 %
Lymphs Abs: 2.6 10*3/uL (ref 0.7–4.0)
MCH: 31.7 pg (ref 26.0–34.0)
MCHC: 33.8 g/dL (ref 30.0–36.0)
MCV: 93.8 fL (ref 78.0–100.0)
MONO ABS: 0.3 10*3/uL (ref 0.1–1.0)
Monocytes Relative: 5 %
NEUTROS ABS: 3.6 10*3/uL (ref 1.7–7.7)
Neutrophils Relative %: 53 %
Platelets: 274 10*3/uL (ref 150–400)
RBC: 4.38 MIL/uL (ref 3.87–5.11)
RDW: 12.6 % (ref 11.5–15.5)
WBC: 6.7 10*3/uL (ref 4.0–10.5)

## 2017-01-09 LAB — POCT PREGNANCY, URINE: Preg Test, Ur: NEGATIVE

## 2017-01-09 MED ORDER — KETOROLAC TROMETHAMINE 60 MG/2ML IM SOLN
60.0000 mg | Freq: Once | INTRAMUSCULAR | Status: AC
  Administered 2017-01-09: 60 mg via INTRAMUSCULAR
  Filled 2017-01-09: qty 2

## 2017-01-09 NOTE — MAU Provider Note (Signed)
History     CSN: [id number]  Arrival date and time: 01/09/17 1541   First Provider Initiated Contact with Patient 01/09/17 2209      Chief Complaint  Patient presents with  . Abdominal Pain  . Vaginal Bleeding   HPI  [name] is a 51 y.o. [id number] who presents to MAU today with complaint of post menopausal bleeding and RLQ abdominal pain. The patient states that she had her LMP in May 2017. She states that over the past week she has had light bleeding. Initially it was very dark and thick and now it is lighter. She states worsening RLQ abdominal pain since onset of bleeding that is now radiating to her low back. She has had issues with chronic low back pain in the past. She also states pain with intercourse x 1 month. She has been taking Ibuprofen with some relief. She rater her pain at 5/10 upon arrival.   OB History    Gravida Para Term Preterm AB Living   2 2 2     2    SAB TAB Ectopic Multiple Live Births                  Past Medical History:  Diagnosis Date  . GERD (gastroesophageal reflux disease)   . Headache   . Hypertension     Past Surgical History:  Procedure Laterality Date  . APPENDECTOMY    . CESAREAN SECTION    . HERNIA REPAIR     X2  . TUBAL LIGATION    . WISDOM TOOTH EXTRACTION      History reviewed. No pertinent family history.  Social History  Substance Use Topics  . Smoking status: Never Smoker  . Smokeless tobacco: Never Used  . Alcohol use 1.2 oz/week    2 Glasses of wine per week    Allergies:  Allergies  Allergen Reactions  . Codeine Nausea And Vomiting    No prescriptions prior to admission.    Review of Systems  Constitutional: Negative for fever.  Gastrointestinal: Positive for abdominal pain. Negative for constipation, diarrhea, nausea and vomiting.  Genitourinary: Positive for dyspareunia and vaginal bleeding. Negative for dysuria, frequency, urgency and vaginal discharge.  Musculoskeletal: Positive for back  pain.   Physical Exam   Blood pressure 121/75, pulse 60, temperature 98.4 F (36.9 C), temperature source Oral, resp. rate 16, height 5\' 2"  (1.575 m), weight 227 lb (103 kg), last menstrual period 12/31/2016.  Physical Exam  Nursing note and vitals reviewed. Constitutional: She is oriented to person, place, and time. She appears well-developed and well-nourished. No distress.  HENT:  Head: Normocephalic and atraumatic.  Cardiovascular: Normal rate.   Respiratory: Effort normal.  GI: Soft. She exhibits no distension and no mass. There is tenderness (mild tenderness to palpation of the RLQ). There is no rebound and no guarding.  Genitourinary: Uterus is not enlarged and not tender. Cervix exhibits no motion tenderness, no discharge and no friability. Right adnexum displays no mass and no tenderness. Left adnexum displays no mass and no tenderness. No bleeding in the vagina. Vaginal discharge (small, thin white discharge noted) found.  Neurological: She is alert and oriented to person, place, and time.  Skin: Skin is warm and dry. No erythema.  Psychiatric: She has a normal mood and affect.    Results for orders placed or performed during the hospital encounter of 01/09/17 (from the past 24 hour(s))  Urinalysis, Routine w reflex microscopic     Status: Abnormal  Collection Time: 01/09/17  4:22 PM  Result Value Ref Range   Color, Urine YELLOW YELLOW   APPearance HAZY (A) CLEAR   Specific Gravity, Urine 1.018 1.005 - 1.030   pH 5.0 5.0 - 8.0   Glucose, UA NEGATIVE NEGATIVE mg/dL   Hgb urine dipstick SMALL (A) NEGATIVE   Bilirubin Urine NEGATIVE NEGATIVE   Ketones, ur NEGATIVE NEGATIVE mg/dL   Protein, ur NEGATIVE NEGATIVE mg/dL   Nitrite NEGATIVE NEGATIVE   Leukocytes, UA NEGATIVE NEGATIVE   RBC / HPF 0-5 0 - 5 RBC/hpf   WBC, UA 0-5 0 - 5 WBC/hpf   Bacteria, UA RARE (A) NONE SEEN   Squamous Epithelial / LPF 6-30 (A) NONE SEEN   Mucous PRESENT   Pregnancy, urine POC     Status:  None   Collection Time: 01/09/17  4:55 PM  Result Value Ref Range   Preg Test, Ur NEGATIVE NEGATIVE  CBC with Differential/Platelet     Status: None   Collection Time: 01/09/17  9:12 PM  Result Value Ref Range   WBC 6.7 4.0 - 10.5 K/uL   RBC 4.38 3.87 - 5.11 MIL/uL   Hemoglobin 13.9 12.0 - 15.0 g/dL   HCT 41.1 36.0 - 46.0 %   MCV 93.8 78.0 - 100.0 fL   MCH 31.7 26.0 - 34.0 pg   MCHC 33.8 30.0 - 36.0 g/dL   RDW 12.6 11.5 - 15.5 %   Platelets 274 150 - 400 K/uL   Neutrophils Relative % 53 %   Neutro Abs 3.6 1.7 - 7.7 K/uL   Lymphocytes Relative 39 %   Lymphs Abs 2.6 0.7 - 4.0 K/uL   Monocytes Relative 5 %   Monocytes Absolute 0.3 0.1 - 1.0 K/uL   Eosinophils Relative 3 %   Eosinophils Absolute 0.2 0.0 - 0.7 K/uL   Basophils Relative 0 %   Basophils Absolute 0.0 0.0 - 0.1 K/uL   US Transvaginal Non-ob  Result Date: 01/09/2017 CLINICAL DATA:  Postmenopausal bleeding with pelvic pain EXAM: TRANSABDOMINAL AND TRANSVAGINAL ULTRASOUND OF PELVIS TECHNIQUE: Both transabdominal and transvaginal ultrasound examinations of the pelvis were performed. Transabdominal technique was performed for global imaging of the pelvis including uterus, ovaries, adnexal regions, and pelvic cul-de-sac. It was necessary to proceed with endovaginal exam following the transabdominal exam to visualize the adnexa and endometrium. COMPARISON:  08/17/2015 FINDINGS: Uterus Measurements: 8.2 x 4.5 x 4.7 cm. No fibroids or other mass visualized. Endometrium Thickness: 4.7 mm.  No focal abnormality visualized. Right ovary Not discretely visualized Left ovary Not discretely visualized Other findings No significant free fluid. Small simple appearing adnexal cysts on the left, largest measures 1.7 cm in size. IMPRESSION: 1. Endometrial thickness of 4.7 mm. In the setting of post-menopausal bleeding, this is consistent with a benign etiology such as endometrial atrophy. If bleeding remains unresponsive to hormonal or medical  therapy, sonohysterogram should be considered for focal lesion work-up. (Ref: Radiological Reasoning: Algorithmic Workup of Abnormal Vaginal Bleeding with Endovaginal Sonography and Sonohysterography. AJR 2008; 956:L87-56) 2. Nonvisualized ovaries.  Small simple appearing left adnexal cysts Electronically Signed   By: Donavan Foil M.D.   On: 01/09/2017 23:13   US Pelvis Complete  Result Date: 01/09/2017 CLINICAL DATA:  Postmenopausal bleeding with pelvic pain EXAM: TRANSABDOMINAL AND TRANSVAGINAL ULTRASOUND OF PELVIS TECHNIQUE: Both transabdominal and transvaginal ultrasound examinations of the pelvis were performed. Transabdominal technique was performed for global imaging of the pelvis including uterus, ovaries, adnexal regions, and pelvic cul-de-sac. It was necessary to proceed  with endovaginal exam following the transabdominal exam to visualize the adnexa and endometrium. COMPARISON:  08/17/2015 FINDINGS: Uterus Measurements: 8.2 x 4.5 x 4.7 cm. No fibroids or other mass visualized. Endometrium Thickness: 4.7 mm.  No focal abnormality visualized. Right ovary Not discretely visualized Left ovary Not discretely visualized Other findings No significant free fluid. Small simple appearing adnexal cysts on the left, largest measures 1.7 cm in size. IMPRESSION: 1. Endometrial thickness of 4.7 mm. In the setting of post-menopausal bleeding, this is consistent with a benign etiology such as endometrial atrophy. If bleeding remains unresponsive to hormonal or medical therapy, sonohysterogram should be considered for focal lesion work-up. (Ref: Radiological Reasoning: Algorithmic Workup of Abnormal Vaginal Bleeding with Endovaginal Sonography and Sonohysterography. AJR 2008; 035:K09-38) 2. Nonvisualized ovaries.  Small simple appearing left adnexal cysts Electronically Signed   By: Donavan Foil M.D.   On: 01/09/2017 23:13    MAU Course  Procedures None  MDM UPT - negative UA, CBC and [place] today  60 mg IM  Toradol given for pain Discussed patient with Dr. Rogue Bussing, follow-up with Dr. Ouida Sills in the office for further work-up. No acute condition today.   Assessment and Plan  A: Post menopausal bleeding Ovarian cyst, simple   P Discharge home Ibuprofen for pain PRN Bleeding precautions discussed Patient advised to follow-up with Ouida Sills Patient may return to MAU as needed or if her condition were to change or worsen   Kerry Hough, PA-C 01/10/2017, 2:18 AM

## 2017-01-09 NOTE — Discharge Instructions (Signed)
Postmenopausal Bleeding Postmenopausal bleeding is any bleeding after menopause. Menopause is when a woman's period stops. Any type of bleeding after menopause is concerning. It should be checked by your doctor. Any treatment will depend on the cause. Follow these instructions at home: Watch your condition for any changes.  Avoid the use of tampons and douches as told by your doctor.  Change your pads often.  Get regular pelvic exams and Pap tests.  Keep all appointments for tests as told by your doctor.  Contact a doctor if:  Your bleeding lasts for more than 1 week.  You have belly (abdominal) pain.  You have bleeding after sex (intercourse). Get help right away if:  You have a fever, chills, a headache, dizziness, muscle aches, and bleeding.  You have strong pain with bleeding.  You have clumps of blood (blood clots) coming from your vagina.  You have bleeding and need more than 1 pad an hour.  You feel like you are going to pass out (faint). This information is not intended to replace advice given to you by your health care provider. Make sure you discuss any questions you have with your health care provider. Document Released: 02/23/2008 Document Revised: 10/22/2015 Document Reviewed: 12/13/2012 Elsevier Interactive Patient Education  2017 Elsevier Inc.  

## 2017-01-09 NOTE — MAU Note (Signed)
Pt advised by Dr. Ouida Sills to come to MAU, going through menopause, hasn't had a period for a year.  Started passing black clots a week ago, began having pelvic pain on Saturday.  Pain is much more severe today, continues to have black bleeding with wiping.

## 2017-01-19 DIAGNOSIS — R300 Dysuria: Secondary | ICD-10-CM | POA: Diagnosis not present

## 2017-01-19 DIAGNOSIS — L639 Alopecia areata, unspecified: Secondary | ICD-10-CM | POA: Diagnosis not present

## 2017-01-19 DIAGNOSIS — L728 Other follicular cysts of the skin and subcutaneous tissue: Secondary | ICD-10-CM | POA: Diagnosis not present

## 2017-01-19 DIAGNOSIS — D229 Melanocytic nevi, unspecified: Secondary | ICD-10-CM | POA: Diagnosis not present

## 2017-03-03 ENCOUNTER — Other Ambulatory Visit (HOSPITAL_COMMUNITY): Payer: Self-pay | Admitting: General Surgery

## 2017-03-13 ENCOUNTER — Other Ambulatory Visit: Payer: Self-pay

## 2017-03-13 ENCOUNTER — Ambulatory Visit (HOSPITAL_COMMUNITY)
Admission: RE | Admit: 2017-03-13 | Discharge: 2017-03-13 | Disposition: A | Discharge status: Home / Self Care | Payer: 59 | Source: Ambulatory Visit | Attending: General Surgery | Admitting: General Surgery

## 2017-03-13 DIAGNOSIS — K7689 Other specified diseases of liver: Secondary | ICD-10-CM | POA: Diagnosis not present

## 2017-03-15 ENCOUNTER — Encounter: Payer: 59 | Attending: General Surgery | Admitting: Registered"

## 2017-03-15 ENCOUNTER — Encounter: Payer: Self-pay | Admitting: Registered"

## 2017-03-15 DIAGNOSIS — Z713 Dietary counseling and surveillance: Secondary | ICD-10-CM | POA: Insufficient documentation

## 2017-03-15 DIAGNOSIS — E669 Obesity, unspecified: Secondary | ICD-10-CM

## 2017-03-15 NOTE — Progress Notes (Signed)
Pre-Op Assessment Visit:  Pre-Operative RYGB Surgery  Medical Nutrition Therapy:  Appt start time: 8:05  End time:  9:05  Patient was seen on 03/15/2017 for Pre-Operative Nutrition Assessment. Assessment and letter of approval faxed to [facility] Surgery Bariatric Surgery Program coordinator on 03/15/2017.   Pt expectation of surgery: feel better, be more active  Pt expectation of Dietitian: educated on meal prep  Start weight at [facility]: 225.1 BMI: 42.53   Pt states she is used to cooking with butter, salt, and grease and wants to learn healthier ways to prepare food. Pt states she gets off work at 5:30pm and sometimes cooks dinner and/or picks up take-out. Pt states she typically eats small portions and feels satisfied. Pt states it will be challenging to give up Pepsi, pasta, mayonnaise, and ranch dressing.  Per insurance, pt needs 6 SWL visits prior to surgery.    24 hr Dietary Recall: First Meal: skips; cereal (Honey nut cheerios or raisin bran) or breakfast bar  Snack: none Second Meal: pretzels and nutella or fast food or peanut butter and jelly sandwich Snack: none Third Meal: Japanese-fried shrimp, pasta or grilled chicken, rice, vegetables, mac and cheese Snack: none Beverages: coffee, 2% milk, water, cranberry juice, pepsi/coke  Encouraged to engage in 75 minutes of moderate physical activity including cardiovascular and weight baring weekly  Handouts given during visit include:  . Pre-Op Goals . Bariatric Surgery Protein Shakes . Vitamin and Mineral Recommendations  During the appointment today the following Pre-Op Goals were reviewed with the patient: . Maintain or lose weight as instructed by your surgeon . Make healthy food choices . Begin to limit portion sizes . Limited concentrated sugars and fried foods . Keep fat/sugar in the single digits per serving on         food labels . Practice CHEWING your food  (aim for 30 chews per bite or until applesauce  consistency) . Practice not drinking 15 minutes before, during, and 30 minutes after each meal/snack . Avoid all carbonated beverages  . Avoid/limit caffeinated beverages  . Avoid all sugar-sweetened beverages . Consume 3 meals per day; eat every 3-5 hours . Make a list of non-food related activities . Aim for 64-100 ounces of FLUID daily  . Aim for at least 60-80 grams of PROTEIN daily . Look for a liquid protein source that contain ?15 g protein and ?5 g carbohydrate  (ex: shakes, drinks, shots) . Physical activity is an important part of a healthy lifestyle so keep it moving!  Follow diet recommendations listed below Energy and Macronutrient Recommendations: Calories: 1600 Carbohydrate: 180 Protein: 120 Fat: 44  Demonstrated degree of understanding via:  Teach Back   Teaching Method Utilized:  Visual Auditory Hands on  Barriers to learning/adherence to lifestyle change: none  Patient to call the Nutrition [facility] Education [facility] to enroll in Pre-Op and Post-Op Nutrition Education when surgery date is scheduled.

## 2017-03-21 DIAGNOSIS — K641 Second degree hemorrhoids: Secondary | ICD-10-CM | POA: Diagnosis not present

## 2017-03-27 DIAGNOSIS — L900 Lichen sclerosus et atrophicus: Secondary | ICD-10-CM | POA: Diagnosis not present

## 2017-03-27 DIAGNOSIS — N950 Postmenopausal bleeding: Secondary | ICD-10-CM | POA: Diagnosis not present

## 2017-04-12 ENCOUNTER — Encounter: Payer: 59 | Attending: General Surgery | Admitting: Registered"

## 2017-04-12 ENCOUNTER — Encounter: Payer: Self-pay | Admitting: Registered"

## 2017-04-12 DIAGNOSIS — Z713 Dietary counseling and surveillance: Secondary | ICD-10-CM | POA: Diagnosis not present

## 2017-04-12 DIAGNOSIS — E669 Obesity, unspecified: Secondary | ICD-10-CM

## 2017-04-12 NOTE — Progress Notes (Signed)
Appt start time: 8:30 end time: 8:50  Assessment: 1st SWL Appointment.   Start Wt at [facility]: 225.1 Wt: 222.7 BMI: 42.08   Pt arrives having lost 2.4 lbs from previous visit.   Pt states she enjoys Muscle Milk (cookies and cream) and Premier protein (caramel-adds to coffee). Pt states she is no longer putting sugar in coffee anymore. Pt states she is drinking more water. Pt states she is also reducing portion sizes.    MEDICATIONS: See list   DIETARY INTAKE:  24-hr recall:  B (9-10 AM): pure protein bar  Snk ( AM): sometimes nuts L (1-2 PM): turkey/cheese roll-ups, veggie straws, fruit cup Snk ( PM): protein shakes D ( PM): grilled chicken or burger patty and non-starchy vegetables Snk ( PM): none Beverages: water, coffee, hot tea, premier protein  Usual physical activity: none stated  Diet to Follow: 1600 calories 180 g carbohydrates 120 g protein 44 g fat  Preferred Learning Style:   No preference indicated   Learning Readiness:   Ready  Change in progress  Nutritional Diagnosis:  [place]-3.3 Overweight/obesity related to past poor dietary habits and physical inactivity as evidenced by patient w/ planned RYGB surgery following dietary guidelines for continued weight loss.    Intervention:  Nutrition counseling for upcoming Bariatric Surgery.  Goals:  - Aim for 150 minutes of physical activity including cardio and weight bearing every week - Try Skinny Girl Dressings for salads - Aim to eat well-balanced meals.  - Track fluid intake using Baritastic App.  - Begin taking multivitamin complete with iron and Vitamin D 5000 IU.  Teaching Method Utilized:  Visual Auditory Hands on  Handouts given during visit include:  none  Barriers to learning/adherence to lifestyle change: none  Demonstrated degree of understanding via:  Teach Back   Monitoring/Evaluation:  Dietary intake, exercise, and body weight in 1 month(s).

## 2017-04-12 NOTE — Patient Instructions (Addendum)
-   Try Skinny Girl Dressings for salads  - Aim to eat well-balanced meals.   - Track fluid intake using Baritastic App.   - Begin taking multivitamin complete with iron and Vitamin D 5000 IU.

## 2017-04-14 DIAGNOSIS — L900 Lichen sclerosus et atrophicus: Secondary | ICD-10-CM | POA: Diagnosis not present

## 2017-04-14 DIAGNOSIS — L259 Unspecified contact dermatitis, unspecified cause: Secondary | ICD-10-CM | POA: Diagnosis not present

## 2017-05-12 ENCOUNTER — Encounter: Payer: 59 | Attending: General Surgery | Admitting: Registered"

## 2017-05-12 ENCOUNTER — Encounter: Payer: Self-pay | Admitting: Registered"

## 2017-05-12 DIAGNOSIS — Z713 Dietary counseling and surveillance: Secondary | ICD-10-CM | POA: Insufficient documentation

## 2017-05-12 DIAGNOSIS — E669 Obesity, unspecified: Secondary | ICD-10-CM

## 2017-05-12 NOTE — Progress Notes (Signed)
Appt start time: 8:30 end time: 8:50  Assessment: 2nd SWL Appointment.   Start Wt at [facility]: 225.1 Wt: 221.5 BMI: 41.85   Pt arrives with husband having lost 1.2 lbs from previous visit. Pt states she has recently been stress-eating due to the loss of her mom earlier this week. Pt states she was doing well prior to her mom's death. Pt states she will work on getting back on track. Pt states she has an appointment with mental health professional today and will discuss recent life changing event. Pt states she has been doing well with not adding sugar to things; using sugar-free creamer in coffee.   Pt states she enjoys Muscle Milk (cookies and cream) and Premier protein (caramel-adds to coffee). Pt states she is drinking more water. Pt states she is also reducing portion sizes.    MEDICATIONS: See list   DIETARY INTAKE:  24-hr recall:  B (9-10 AM): pure protein bar  Snk ( AM): sometimes nuts L (1-2 PM): turkey/cheese roll-ups, veggie straws, fruit cup Snk ( PM): protein shakes D ( PM): grilled chicken or burger patty and non-starchy vegetables Snk ( PM): none Beverages: water, coffee, hot tea, premier protein  Usual physical activity: none stated  Diet to Follow: 1600 calories 180 g carbohydrates 120 g protein 44 g fat  Preferred Learning Style:   No preference indicated   Learning Readiness:   Ready  Change in progress  Nutritional Diagnosis:  [place]-3.3 Overweight/obesity related to past poor dietary habits and physical inactivity as evidenced by patient w/ planned RYGB surgery following dietary guidelines for continued weight loss.    Intervention:  Nutrition counseling for upcoming Bariatric Surgery.  Goals:  - Make a list on non food-related activities to do when feeling stressed or emotional.   Teaching Method Utilized:  Visual Auditory Hands on  Handouts given during visit include:  101 Things to do besides eat  Barriers to learning/adherence to  lifestyle change: none  Demonstrated degree of understanding via:  Teach Back   Monitoring/Evaluation:  Dietary intake, exercise, and body weight in 1 month(s).

## 2017-05-12 NOTE — Patient Instructions (Addendum)
-   Make a list on non food-related activities to do when feeling stressed or emotional.

## 2017-05-18 DIAGNOSIS — L299 Pruritus, unspecified: Secondary | ICD-10-CM | POA: Diagnosis not present

## 2017-05-29 DIAGNOSIS — H00025 Hordeolum internum left lower eyelid: Secondary | ICD-10-CM | POA: Diagnosis not present

## 2017-05-30 HISTORY — PX: CARDIAC CATHETERIZATION: SHX172

## 2017-05-31 DIAGNOSIS — H00025 Hordeolum internum left lower eyelid: Secondary | ICD-10-CM | POA: Diagnosis not present

## 2017-06-07 DIAGNOSIS — Z1231 Encounter for screening mammogram for malignant neoplasm of breast: Secondary | ICD-10-CM | POA: Diagnosis not present

## 2017-06-07 DIAGNOSIS — Z01419 Encounter for gynecological examination (general) (routine) without abnormal findings: Secondary | ICD-10-CM | POA: Diagnosis not present

## 2017-06-07 DIAGNOSIS — Z124 Encounter for screening for malignant neoplasm of cervix: Secondary | ICD-10-CM | POA: Diagnosis not present

## 2017-06-07 DIAGNOSIS — K649 Unspecified hemorrhoids: Secondary | ICD-10-CM | POA: Insufficient documentation

## 2017-06-14 ENCOUNTER — Encounter: Payer: 59 | Attending: General Surgery | Admitting: Registered"

## 2017-06-14 ENCOUNTER — Encounter: Payer: Self-pay | Admitting: Registered"

## 2017-06-14 DIAGNOSIS — Z713 Dietary counseling and surveillance: Secondary | ICD-10-CM | POA: Diagnosis not present

## 2017-06-14 DIAGNOSIS — L309 Dermatitis, unspecified: Secondary | ICD-10-CM | POA: Diagnosis not present

## 2017-06-14 DIAGNOSIS — E669 Obesity, unspecified: Secondary | ICD-10-CM

## 2017-06-14 NOTE — Patient Instructions (Addendum)
-   Aim to chew at least 30 times per bite or to applesauce consistency.   - Practice mindful eating.   - Keep up the great work with what you're already doing!

## 2017-06-14 NOTE — Progress Notes (Signed)
Appt start time: 12:11 end time: 12:25  Assessment: 3rd SWL Appointment.   Start Wt at NDES: 225.1 Wt: 222.9 BMI: 41.10   Pt arrives with husband having gained 1.4 lbs from previous visit. Pt sTates she is getting at least 64 ounces of water a day. Pt states she is doing better with reading labels when grocery shopping. Pt states she is doing well with stopping when full while eating. Pt states she is getting back on track since having a challenging month in December with recent loss of her mom. Pt states she recognizes when she is stressed and wants to eat, chooses alternative of walking instead of eating.  Pt states she was doing well prior to her mom's death.  Pt states she has been doing well with not adding sugar to things; using sugar-free creamer in coffee.   Pt states she enjoys Muscle Milk (cookies and cream) and Premier protein (caramel-adds to coffee). Pt states she is also reducing portion sizes.    MEDICATIONS: See list   DIETARY INTAKE:  24-hr recall:  B (9-10 AM): pure protein bar  Snk ( AM): sometimes nuts L (1-2 PM): turkey/cheese roll-ups, veggie straws, fruit cup or peanut butter Snk ( PM): protein shake D ( PM): tomato basil soup, rolls or hibachi shrimp, vegetables, noodles Snk ( PM): none Beverages: water, coffee (1 cup), hot tea, premier protein  Usual physical activity: none stated  Diet to Follow: 1600 calories 180 g carbohydrates 120 g protein 44 g fat  Preferred Learning Style:   No preference indicated   Learning Readiness:   Ready  Change in progress  Nutritional Diagnosis:  [place]-3.3 Overweight/obesity related to past poor dietary habits and physical inactivity as evidenced by patient w/ planned RYGB surgery following dietary guidelines for continued weight loss.    Intervention:  Nutrition counseling for upcoming Bariatric Surgery.  Goals:  - Aim to chew at least 30 times per bite or to applesauce consistency.  - Practice mindful  eating.  - Keep up the great work with what you're already doing!  Teaching Method Utilized:  Visual Auditory Hands on  Handouts given during visit include: - none  Barriers to learning/adherence to lifestyle change: none  Demonstrated degree of understanding via:  Teach Back   Monitoring/Evaluation:  Dietary intake, exercise, and body weight in 1 month(s).

## 2017-06-15 ENCOUNTER — Ambulatory Visit: Payer: 59 | Admitting: Registered"

## 2017-06-23 ENCOUNTER — Other Ambulatory Visit: Payer: Self-pay | Admitting: Physician Assistant

## 2017-06-23 ENCOUNTER — Ambulatory Visit
Admission: RE | Admit: 2017-06-23 | Discharge: 2017-06-23 | Disposition: A | Discharge status: Home / Self Care | Payer: 59 | Source: Ambulatory Visit | Attending: Physician Assistant | Admitting: Physician Assistant

## 2017-06-23 DIAGNOSIS — R0789 Other chest pain: Secondary | ICD-10-CM

## 2017-06-23 DIAGNOSIS — R059 Cough, unspecified: Secondary | ICD-10-CM

## 2017-06-23 DIAGNOSIS — R05 Cough: Secondary | ICD-10-CM

## 2017-06-23 DIAGNOSIS — M79645 Pain in left finger(s): Secondary | ICD-10-CM | POA: Diagnosis not present

## 2017-07-13 DIAGNOSIS — L299 Pruritus, unspecified: Secondary | ICD-10-CM | POA: Diagnosis not present

## 2017-07-18 ENCOUNTER — Encounter: Payer: Self-pay | Admitting: Skilled Nursing Facility1

## 2017-07-18 ENCOUNTER — Encounter: Payer: 59 | Attending: General Surgery | Admitting: Skilled Nursing Facility1

## 2017-07-18 DIAGNOSIS — I10 Essential (primary) hypertension: Secondary | ICD-10-CM | POA: Diagnosis not present

## 2017-07-18 DIAGNOSIS — Z713 Dietary counseling and surveillance: Secondary | ICD-10-CM | POA: Insufficient documentation

## 2017-07-18 DIAGNOSIS — R079 Chest pain, unspecified: Secondary | ICD-10-CM | POA: Diagnosis not present

## 2017-07-18 DIAGNOSIS — R51 Headache: Secondary | ICD-10-CM | POA: Diagnosis not present

## 2017-07-18 DIAGNOSIS — E669 Obesity, unspecified: Secondary | ICD-10-CM

## 2017-07-18 NOTE — Progress Notes (Signed)
Appt start time: 12:11 end time: 12:25  Assessment: 4th SWL Appointment.  Start Wt at NDES: 225.1 Wt: 223 BMI: 41.12   Pt arrives with husband having gained 1.4 lbs from previous visit. Pt sTates she is getting at least 64 ounces of water a day. Pt states she is doing better with reading labels when grocery shopping. Pt states she is doing well with stopping when full while eating. Pt states she is getting back on track since having a challenging month in December with recent loss of her mom. Pt states she recognizes when she is stressed and wants to eat, chooses alternative of walking instead of eating.  Pt states she was doing well prior to her mom's death.  Pt states she has been doing well with not adding sugar to things; using sugar-free creamer in coffee.   Pt states she enjoys Muscle Milk (cookies and cream) and Premier protein (caramel-adds to coffee). Pt states she is also reducing portion sizes.   Pt arrives having maintained weight. Pt states she has been doing horribly; struggling with sometimes doing well and other times not doing well due to the grief she is feeling for her loss. Pt states she is Trying to stay motivated going out for lunch with her [place] and cheese roll up. Pt states if she cannot get a handle on her grief she will push back surgery.  MEDICATIONS: See list   DIETARY INTAKE:  24-hr recall:  B (9-10 AM): pure protein bar  Snk ( AM): sometimes nuts L (1-2 PM): turkey/cheese roll-ups, veggie straws, fruit cup or peanut butter Snk ( PM): protein shake D ( PM): tomato basil soup, rolls or hibachi shrimp, vegetables, noodles Snk ( PM): none Beverages: water, coffee (1 cup), hot tea, premier protein  Usual physical activity: none stated  Diet to Follow: 1600 calories 180 g carbohydrates 120 g protein 44 g fat  Preferred Learning Style:   No preference indicated   Learning Readiness:   Ready  Change in progress  Nutritional Diagnosis:  [place]-3.3  Overweight/obesity related to past poor dietary habits and physical inactivity as evidenced by patient w/ planned RYGB surgery following dietary guidelines for continued weight loss.    Intervention:  Nutrition counseling for upcoming Bariatric Surgery.  Goals:  - Aim to chew at least 30 times per bite or to applesauce consistency.  - Practice mindful eating. Using the needs and emotions sheet - Keep up the great work with what you're already doing!  Teaching Method Utilized:  Visual Auditory Hands on  Handouts given during visit include: - none  Barriers to learning/adherence to lifestyle change: none  Demonstrated degree of understanding via:  Teach Back   Monitoring/Evaluation:  Dietary intake, exercise, and body weight in 1 month(s).

## 2017-07-21 ENCOUNTER — Encounter: Payer: Self-pay | Admitting: Cardiology

## 2017-07-21 ENCOUNTER — Ambulatory Visit (INDEPENDENT_AMBULATORY_CARE_PROVIDER_SITE_OTHER): Payer: 59 | Admitting: Cardiology

## 2017-07-21 VITALS — BP 104/76 | HR 73 | Ht 62.0 in | Wt 224.2 lb

## 2017-07-21 DIAGNOSIS — R0609 Other forms of dyspnea: Secondary | ICD-10-CM | POA: Diagnosis not present

## 2017-07-21 DIAGNOSIS — R0789 Other chest pain: Secondary | ICD-10-CM | POA: Diagnosis not present

## 2017-07-21 DIAGNOSIS — R9431 Abnormal electrocardiogram [ECG] [EKG]: Secondary | ICD-10-CM | POA: Insufficient documentation

## 2017-07-21 DIAGNOSIS — I10 Essential (primary) hypertension: Secondary | ICD-10-CM | POA: Diagnosis not present

## 2017-07-21 DIAGNOSIS — R079 Chest pain, unspecified: Secondary | ICD-10-CM

## 2017-07-21 MED ORDER — PREDNISONE 10 MG PO TABS: ORAL_TABLET | ORAL | 0 refills | Status: DC

## 2017-07-21 NOTE — Progress Notes (Signed)
PCP: Alroy Dust, L.Marlou Sa, MD  Clinic Note: Chief Complaint  Patient presents with  . Chest Pain    pt states feeling some pain in her chest area.  . New Patient (Initial Visit)    HPI: Suzanne Gutierrez is a 52 y.o. female who is being seen today for the evaluation of Chest Pain with an Abnormal EKG at the request of Artist Pais, [place] for [place], L.Marlou Sa, MD .   Suzanne Gutierrez was seen on February 19 by Artist Pais, [place].  At that time she indicated that she was try to get some exercise stationary bike, and has been working with a nutritionist.  She indicated that she was noticing some chest pressure (not associated with exertion because she does not do much).  This is associated with a feeling of shortness of breath and fatigue.  Symptoms have been ongoing for a few weeks.  She has been evaluated for chest pain in the past maybe 6 years ago with stress test that was negative, followed by a heart catheterization in 2016 while vacationing at [facility]. Recent Hospitalizations: None  Studies Personally Reviewed - (if available, images/films reviewed: From Epic Chart or Care Everywhere)  Cardiac Cath ([place]) Summer 2016 - told no significant CAD.  Interval History: Suzanne Gutierrez presents here today indicating that she is dealing with several different types of chest discomfort 1 is a left sided chest discomfort radiating along the side to the mid axillary line.  She also is noticing some pain and numbness in her left arm and neck.  These 2 are not mutually exclusive but also not happening at the same time all the time.  The chest discomfort is described as a squeezing tightness that happens off and on and is usually worse with her feeling stressed.  She describes it as a pressure.  This seems to last persistently for a couple days or so but for the last 2 or 3 days she is also noted some sharp stabbing type discomfort may be lasting a minute or 2 that has taken her breath away.  This is the  concern for her because it is new.  She also has noted that she is short of breath all the time with or without activity.  Probably related to deconditioning as she notes that she really does not do much in the way of any exercise.   She does note that the sharp discomfort may be triggered by certain actions and this is what radiates to the left arm.  The left arm numbness however is also persistent making it somewhat confusing.  She denies any resting dyspnea, only exertional.  No heart failure symptoms of PND, orthopnea or edema. No palpitations, lightheadedness, dizziness, weakness or syncope/near syncope. No TIA/amaurosis fugax symptoms. No melena, hematochezia, hematuria, or epstaxis. No claudication.  ROS: A comprehensive was performed. Review of Systems  Constitutional: Positive for malaise/fatigue (Just no energy to do anything). Negative for chills and fever.  HENT: Negative for congestion and nosebleeds.   Eyes: Negative.   Respiratory: Positive for shortness of breath (Per HPI).   Gastrointestinal: Negative for abdominal pain, blood in stool, heartburn and melena.  Genitourinary: Negative for hematuria.  Musculoskeletal: Positive for myalgias (Left arm/shoulder pain) and neck pain (Left upper neck). Negative for falls.  Skin: Positive for rash (Lichen sclerosus -of vulva).  Neurological: Positive for headaches. Negative for dizziness and weakness.  Psychiatric/Behavioral: Negative for memory loss. The patient is nervous/anxious. The patient does not have insomnia.  All other systems reviewed and are negative.   I have reviewed and (if needed) personally updated the patient's problem list, medications, allergies, past medical and surgical history, social and family history.   Past Medical History:  Diagnosis Date  . Bipolar disorder ([place])   . GERD (gastroesophageal reflux disease)   . Headache    Migraine  . Hypertension   . Lichen sclerosus of female genitalia   . Morbid  obesity with BMI of 40.0-44.9, adult ([place])    Has been in the process of evaluation for GOP  . Osteoarthritis     Past Surgical History:  Procedure Laterality Date  . APPENDECTOMY  1994  . CARDIAC CATHETERIZATION  2016   [facility] Olin Hauser --was told she had no significant disease.  Marland Kitchen CESAREAN SECTION    . HERNIA REPAIR     X2  . TUBAL LIGATION    . WISDOM TOOTH EXTRACTION      Current Meds  Medication Sig  . escitalopram (LEXAPRO) 10 MG tablet Take 10 mg by mouth daily.  . hydrOXYzine (ATARAX/VISTARIL) 25 MG tablet Take 25 mg by mouth daily.  Marland Kitchen ibuprofen (ADVIL,MOTRIN) 200 MG tablet Take 800 mg by mouth every 6 (six) hours as needed for moderate pain.   . metoprolol succinate (TOPROL-XL) 50 MG 24 hr tablet Take 50 mg by mouth daily. Take with or immediately following a meal.  . omeprazole (PRILOSEC) 20 MG capsule Take 20 mg by mouth daily.  Marland Kitchen zolpidem (AMBIEN) 10 MG tablet Take 5 mg by mouth at bedtime as needed for sleep.    Allergies  Allergen Reactions  . Codeine Nausea And Vomiting    Social History   Tobacco Use  . Smoking status: Never Smoker  . Smokeless tobacco: Never Used  Substance Use Topics  . Alcohol use: Yes    Alcohol/week: 1.2 oz    Types: 2 Glasses of wine per week  . Drug use: No   Social History   Social History Narrative   She tries to exercise at least once a week doing 50 minutes on a stationary bike.  Otherwise no routine exercise.    family history includes COPD in her other; Cancer in her other; Dementia in her mother; Diabetes in her father and other; Hypertension in her other; Other in her brother; Stroke in her father, other, and sister.  Wt Readings from Last 3 Encounters:  07/21/17 224 lb 3.2 oz (101.7 kg)  07/18/17 223 lb (101.2 kg)  06/14/17 222 lb 14.4 oz (101.1 kg)    PHYSICAL EXAM BP 104/76   Pulse 73   Ht 5\' 2"  (1.575 m)   Wt 224 lb 3.2 oz (101.7 kg)   BMI 41.01 kg/m  Physical Exam  Constitutional: She is  oriented to person, place, and time. She appears well-developed and well-nourished. No distress.  Morbidly obese.  Well-groomed  HENT:  Head: Normocephalic and atraumatic.  Mouth/Throat: Oropharynx is clear and moist. No oropharyngeal exudate.  Eyes: Conjunctivae and EOM are normal. Pupils are equal, round, and reactive to light. No scleral icterus.  Neck: Normal range of motion. No hepatojugular reflux and no JVD present. Carotid bruit is not present. No thyromegaly present.  Cardiovascular: Normal rate, regular rhythm, normal heart sounds, intact distal pulses and normal pulses.  No extrasystoles are present. PMI is not displaced (Cannot palpate). Exam reveals no gallop and no friction rub.  No murmur heard. Pulmonary/Chest: Effort normal and breath sounds normal. No respiratory distress. She has no  wheezes. She has no rales. She exhibits tenderness (Along the left sternal costal border radiating along the upper ribs.).  Abdominal: Soft. Bowel sounds are normal. She exhibits no distension. There is no tenderness. There is no rebound.  Obese  Musculoskeletal: Normal range of motion. She exhibits edema (Trivial).  Neurological: She is alert and oriented to person, place, and time. No cranial nerve deficit.  Skin: Skin is warm and dry. No rash noted. No erythema.  Psychiatric: She has a normal mood and affect. Her behavior is normal. Judgment and thought content normal.  Nursing note and vitals reviewed.   Adult ECG Report  Rate: 73 ;  Rhythm: normal sinus rhythm and Nonspecific ST-T wave changes with questionable T wave inversions in V2 and V3.;  Otherwise normal axis, intervals and durations  Narrative Interpretation: Borderline EKG   Other studies Reviewed: Additional studies/ records that were reviewed today include:  Recent Labs: July 18, 2017  Na+ 139, K+ 4.4, Cl- 104, HCO3-27, BUN 13, Cr 0.8, Glu 123, Ca2+ 9.7;   Lipids not available    ASSESSMENT / PLAN: Problem List  Items Addressed This Visit    Abnormal finding on EKG    There are some mild T wave changes in V2 and V3 with cannot exclude ischemia.  Otherwise normal.  We will evaluate for ischemia with a Myoview stress test.      Relevant Orders   MYOCARDIAL PERFUSION IMAGING   Chest pain with moderate risk for cardiac etiology - Primary    Suzanne Gutierrez has several different types of left-sided chest pain.  But the left arm numbness associated with left chest numbness that is now associated with being short of breath all the time is somewhat concerning in light of her potential upcoming gastric bypass surgery.  By report, her heart catheterization in 2016 was negative for ischemia.  I think a physiologic study would be beneficial at this time,  But I am not sure if she can walk on treadmill.  Plan: Lexiscan Myoview      Relevant Orders   MYOCARDIAL PERFUSION IMAGING   DOE (dyspnea on exertion)    This is now a relatively persistent symptom which is probably related to obesity and deconditioning, but following few episodes of chest pain, need to exclude ischemic etiology.  A Myoview would provide [place] both ischemic evaluation as well as assessment of EF.      Relevant Orders   EKG 12-Lead (Completed)   Essential hypertension (Chronic)    Well-controlled with metoprolol.      Left-sided chest wall pain    If her stress test is negative for ischemia, I would suggest that the chest pain she describing is musculoskeletal in nature based on physical exam and process of elimination. The sharp nature pain radiating to the left arm actually sounds more consistent with costochondritis and I want to treat this with a short prednisone taper while we are waiting to see what the stress test looks like.        Relevant Orders   EKG 12-Lead (Completed)      Current medicines are reviewed at length with the patient today. (+/- concerns) CP The following changes have been made: Steroid Taper  Patient  Instructions  Medication Instructions:  START prednisone as directed  Testing/Procedures: Your physician has requested that you have en exercise stress myoview. For further information please visit [email or web address]. Please follow instruction sheet, as given.  Medications to hold: metoprolol  Follow-Up: After testing with Dr. Ellyn Hack  Any Other Special Instructions Will Be Listed Below (If Applicable).     If you need a refill on your cardiac medications before your next appointment, please call your pharmacy.     Studies Ordered:   Orders Placed This Encounter  Procedures  . MYOCARDIAL PERFUSION IMAGING  . EKG 12-Lead      Glenetta Hew, M.D., M.S. Interventional Cardiologist   Pager # [phone] Phone # [phone] [address] [place], [place] [address]   Thank you for choosing Heart[facility] at [facility]!!

## 2017-07-21 NOTE — Patient Instructions (Addendum)
Medication Instructions:  START prednisone as directed  Testing/Procedures: Your physician has requested that you have en exercise stress myoview. For further information please visit [email or web address]. Please follow instruction sheet, as given.  Medications to hold: metoprolol  Follow-Up: After testing with Dr. Ellyn Hack  Any Other Special Instructions Will Be Listed Below (If Applicable).     If you need a refill on your cardiac medications before your next appointment, please call your pharmacy.

## 2017-07-24 ENCOUNTER — Encounter: Payer: Self-pay | Admitting: Cardiology

## 2017-07-24 ENCOUNTER — Telehealth: Payer: Self-pay | Admitting: Cardiology

## 2017-07-24 DIAGNOSIS — I10 Essential (primary) hypertension: Secondary | ICD-10-CM | POA: Insufficient documentation

## 2017-07-24 NOTE — Assessment & Plan Note (Signed)
There are some mild T wave changes in V2 and V3 with cannot exclude ischemia.  Otherwise normal.  We will evaluate for ischemia with a Myoview stress test.

## 2017-07-24 NOTE — Assessment & Plan Note (Signed)
Suzanne Gutierrez has several different types of left-sided chest pain.  But the left arm numbness associated with left chest numbness that is now associated with being short of breath all the time is somewhat concerning in light of her potential upcoming gastric bypass surgery.  By report, her heart catheterization in 2016 was negative for ischemia.  I think a physiologic study would be beneficial at this time,  But I am not sure if she can walk on treadmill.  Plan: [employer]

## 2017-07-24 NOTE — Telephone Encounter (Incomplete)
ROI faxed to [facility] 07/24/17 ab

## 2017-07-24 NOTE — Assessment & Plan Note (Addendum)
If her stress test is negative for ischemia, I would suggest that the chest pain she describing is musculoskeletal in nature based on physical exam and process of elimination. The sharp nature pain radiating to the left arm actually sounds more consistent with costochondritis and I want to treat this with a short prednisone taper while we are waiting to see what the stress test looks like.

## 2017-07-24 NOTE — Assessment & Plan Note (Signed)
This is now a relatively persistent symptom which is probably related to obesity and deconditioning, but following few episodes of chest pain, need to exclude ischemic etiology.  A Myoview would provide [place] both ischemic evaluation as well as assessment of EF.

## 2017-07-24 NOTE — Assessment & Plan Note (Signed)
Well controlled with metoprolol.    

## 2017-07-26 ENCOUNTER — Other Ambulatory Visit: Payer: Self-pay

## 2017-07-26 ENCOUNTER — Observation Stay (HOSPITAL_COMMUNITY)
Admission: EM | Admit: 2017-07-26 | Discharge: 2017-07-27 | Disposition: A | Discharge status: Home / Self Care | Payer: 59 | Attending: Internal Medicine | Admitting: Internal Medicine

## 2017-07-26 ENCOUNTER — Encounter (HOSPITAL_COMMUNITY): Payer: Self-pay | Admitting: *Deleted

## 2017-07-26 ENCOUNTER — Emergency Department (HOSPITAL_COMMUNITY): Payer: 59

## 2017-07-26 DIAGNOSIS — Z79899 Other long term (current) drug therapy: Secondary | ICD-10-CM | POA: Insufficient documentation

## 2017-07-26 DIAGNOSIS — R079 Chest pain, unspecified: Secondary | ICD-10-CM

## 2017-07-26 DIAGNOSIS — E119 Type 2 diabetes mellitus without complications: Secondary | ICD-10-CM

## 2017-07-26 DIAGNOSIS — I10 Essential (primary) hypertension: Secondary | ICD-10-CM | POA: Diagnosis present

## 2017-07-26 DIAGNOSIS — R0789 Other chest pain: Secondary | ICD-10-CM | POA: Diagnosis not present

## 2017-07-26 DIAGNOSIS — R0602 Shortness of breath: Secondary | ICD-10-CM | POA: Diagnosis not present

## 2017-07-26 HISTORY — DX: Type 2 diabetes mellitus without complications: E11.9

## 2017-07-26 LAB — CBC
HEMATOCRIT: 39.8 % (ref 36.0–46.0)
HEMOGLOBIN: 13.3 g/dL (ref 12.0–15.0)
MCH: 31.4 pg (ref 26.0–34.0)
MCHC: 33.4 g/dL (ref 30.0–36.0)
MCV: 93.9 fL (ref 78.0–100.0)
Platelets: 325 10*3/uL (ref 150–400)
RBC: 4.24 MIL/uL (ref 3.87–5.11)
RDW: 12.5 % (ref 11.5–15.5)
WBC: 11.2 10*3/uL — ABNORMAL HIGH (ref 4.0–10.5)

## 2017-07-26 LAB — BASIC METABOLIC PANEL
ANION GAP: 12 (ref 5–15)
BUN: 13 mg/dL (ref 6–20)
CO2: 23 mmol/L (ref 22–32)
Calcium: 9.1 mg/dL (ref 8.9–10.3)
Chloride: 103 mmol/L (ref 101–111)
Creatinine, Ser: 0.84 mg/dL (ref 0.44–1.00)
GLUCOSE: 247 mg/dL — AB (ref 65–99)
POTASSIUM: 3.6 mmol/L (ref 3.5–5.1)
Sodium: 138 mmol/L (ref 135–145)

## 2017-07-26 LAB — I-STAT TROPONIN, ED
Troponin i, poc: 0 ng/mL (ref 0.00–0.08)
Troponin i, poc: 0 ng/mL (ref 0.00–0.08)

## 2017-07-26 MED ORDER — INSULIN ASPART 100 UNIT/ML ~~LOC~~ SOLN
0.0000 [IU] | SUBCUTANEOUS | Status: DC
  Administered 2017-07-27: 1 [IU] via SUBCUTANEOUS
  Filled 2017-07-26: qty 1

## 2017-07-26 MED ORDER — ONDANSETRON HCL 4 MG/2ML IJ SOLN
4.0000 mg | Freq: Once | INTRAMUSCULAR | Status: AC
  Administered 2017-07-26: 4 mg via INTRAVENOUS
  Filled 2017-07-26: qty 2

## 2017-07-26 MED ORDER — ASPIRIN 81 MG PO CHEW
324.0000 mg | CHEWABLE_TABLET | Freq: Once | ORAL | Status: AC
  Administered 2017-07-26: 324 mg via ORAL
  Filled 2017-07-26: qty 4

## 2017-07-26 MED ORDER — NITROGLYCERIN 0.4 MG SL SUBL
0.4000 mg | SUBLINGUAL_TABLET | SUBLINGUAL | Status: AC | PRN
  Administered 2017-07-26 (×3): 0.4 mg via SUBLINGUAL
  Filled 2017-07-26: qty 1

## 2017-07-26 MED ORDER — MORPHINE SULFATE (PF) 4 MG/ML IV SOLN
8.0000 mg | Freq: Once | INTRAVENOUS | Status: AC
Start: 2017-07-26 — End: 2017-07-26
  Administered 2017-07-26: 8 mg via INTRAVENOUS
  Filled 2017-07-26: qty 2

## 2017-07-26 NOTE — ED Notes (Signed)
ED Provider at bedside. 

## 2017-07-26 NOTE — H&P (Signed)
TRH H&P   Patient Demographics:    Suzanne Gutierrez, is a 52 y.o. female  MRN: [id number]   DOB - 1966/03/17  Admit Date - 07/26/2017  Outpatient Primary MD for the patient is Alroy Dust, L.Marlou Sa, MD  Referring MD/NP/PA:  Sherwood Gambler  Outpatient Specialists: Glenetta Hew   Patient coming from: home  Chief Complaint  Patient presents with  . Chest Pain      HPI:    Suzanne Gutierrez  is a 52 y.o. female, w Hypertension, Dm2 , Fatty Liver, Gerd, Bipolar, Morbid Obeisity, apparently c/o chest pain which has been going on for months.  Today, around noon apparently she developed chest pain " pressure" at work with left arm heaviness. Slight dyspnea.  Pt denies fever, chills, cough, n/v, heartburn, diarrhea, brbpr, black stool.  Pt states that her pain was not helped by nitro.  Pt notes she had a negative cardiac cath in 2016 in [place]. Pt presented to ED for evaluation of chest pain.     In Ed,  CXR aortic atherosclerosis, no edema, no consolidation.   Na 138, K 3.6, Bun 13, Creatinine 0.84 Wbc 11.2, Hgb 13.3, Plt 325  Trop 0.00 EKG nsr at 60, nl axis, early R progression t wave flat in v1-3  Pt will be admitted for w/up chest pain    Review of systems:    In addition to the HPI above, No Fever-chills, No Headache, No changes with Vision or hearing, No problems swallowing food or Liquids, No Cough  No Abdominal pain, No Nausea or Vommitting, Bowel movements are regular, No Blood in stool or Urine, No dysuria, No new skin rashes or bruises, No new joints pains-aches,  No new weakness, tingling, numbness in any extremity, No recent weight gain or loss, No polyuria, polydypsia or polyphagia, No significant Mental Stressors.  A full 10 point Review of Systems was done, except as stated above, all other Review of Systems were negative.   With Past History of  the following :    Past Medical History:  Diagnosis Date  . Bipolar disorder ([place])   . Diabetes mellitus without complication ([place])   . GERD (gastroesophageal reflux disease)   . Headache    Migraine  . Hypertension   . Lichen sclerosus of female genitalia   . Morbid obesity with BMI of 40.0-44.9, adult ([place])    Has been in the process of evaluation for GOP  . Osteoarthritis       Past Surgical History:  Procedure Laterality Date  . APPENDECTOMY  1994  . CARDIAC CATHETERIZATION  2016   [facility] Olin Hauser --was told she had no significant disease.  Marland Kitchen CESAREAN SECTION    . HERNIA REPAIR     X2  . TUBAL LIGATION    . WISDOM TOOTH EXTRACTION        Social History:  Social History   Tobacco Use  . Smoking status: Never Smoker  . Smokeless tobacco: Never Used  Substance Use Topics  . Alcohol use: Yes    Alcohol/week: 1.2 oz    Types: 2 Glasses of wine per week     Lives - at home  Mobility - walks by self   Family History :     Family History  Problem Relation Age of Onset  . COPD Other   . Cancer Other   . Hypertension Other   . Stroke Other   . Diabetes Other   . Dementia Mother   . Diabetes Father        With diabetic retinopathy  . Stroke Father   . Stroke Sister   . Other Brother        3 brothers with unknown history      Home Medications:   Prior to Admission medications   Medication Sig Start Date End Date Taking? Authorizing Provider  desoximetasone (TOPICORT) 0.05 % cream Apply 1 application topically 2 (two) times daily.   Yes [provider]  escitalopram (LEXAPRO) 10 MG tablet Take 10 mg by mouth daily.   Yes [provider]  lidocaine (XYLOCAINE) 5 % ointment Apply 1 application topically as needed for mild pain. For numbing an area 07/24/17  Yes [provider]  metoprolol succinate (TOPROL-XL) 50 MG 24 hr tablet Take 50 mg by mouth daily. Take with or immediately following a meal.   Yes [provider]  Multiple Vitamin (MULTIVITAMIN WITH MINERALS) TABS tablet Take 1 tablet by mouth daily.   Yes [provider]  omeprazole (PRILOSEC) 20 MG capsule Take 20 mg by mouth daily.   Yes [provider]  predniSONE (DELTASONE) 10 MG tablet Take 6 tablets (60 mg total) by mouth daily for 2 days, THEN 4 tablets (40 mg total) daily for 2 days, THEN 2 tablets (20 mg total) daily for 2 days, THEN 1 tablet (10 mg total) daily for 2 days. 07/21/17 07/29/17 Yes Leonie Man, MD  zolpidem (AMBIEN) 10 MG tablet Take 5 mg by mouth at bedtime as needed for sleep.   Yes [provider]     Allergies:     Allergies  Allergen Reactions  . Codeine Nausea And Vomiting     Physical Exam:   Vitals  Blood pressure 121/87, pulse (!) 58, temperature 98.4 F (36.9 C), temperature source Oral, resp. rate 20, SpO2 98 %.   1. General  lying in bed in NAD,    2. Normal affect and insight, Not Suicidal or Homicidal, Awake Alert, Oriented X 3.  3. No F.N deficits, ALL C.Nerves Intact, Strength 5/5 all 4 extremities, Sensation intact all 4 extremities, Plantars down going.  4. Ears and Eyes appear Normal, Conjunctivae clear, PERRLA. Moist Oral Mucosa.  5. Supple Neck, No JVD, No cervical lymphadenopathy appriciated, No Carotid Bruits.  6. Symmetrical Chest wall movement, Good air movement bilaterally, CTAB.  7. RRR, No Gallops, Rubs or Murmurs, No Parasternal Heave.  8. Positive Bowel Sounds, Abdomen Soft, No tenderness, No organomegaly appriciated,No rebound -guarding or rigidity.  9.  No Cyanosis, Normal Skin Turgor, No Skin Rash or Bruise.  10. Good muscle tone,  joints appear normal , no effusions, Normal ROM.  11. No Palpable Lymph Nodes in Neck or Axillae     Data Review:    CBC Recent Labs  Lab 07/26/17 1759  WBC 11.2*  HGB 13.3  HCT 39.8  PLT 325  MCV 93.9  MCH 31.4  MCHC 33.4  RDW 12.5    ------------------------------------------------------------------------------------------------------------------  Chemistries  Recent Labs  Lab 07/26/17 1759  NA 138  K 3.6  CL 103  CO2 23  GLUCOSE 247*  BUN 13  CREATININE 0.84  CALCIUM 9.1   ------------------------------------------------------------------------------------------------------------------ estimated creatinine clearance is 88.4 mL/min (by C-G formula based on SCr of 0.84 mg/dL). ------------------------------------------------------------------------------------------------------------------ No results for input(s): TSH, T4TOTAL, T3FREE, THYROIDAB in the last 72 hours.  Invalid input(s): FREET3  Coagulation profile No results for input(s): INR, PROTIME in the last 168 hours. ------------------------------------------------------------------------------------------------------------------- No results for input(s): DDIMER in the last 72 hours. -------------------------------------------------------------------------------------------------------------------  Cardiac Enzymes No results for input(s): CKMB, TROPONINI, MYOGLOBIN in the last 168 hours.  Invalid input(s): CK ------------------------------------------------------------------------------------------------------------------ No results found for: BNP   ---------------------------------------------------------------------------------------------------------------  Urinalysis    Component Value Date/Time   COLORURINE YELLOW 01/09/2017 1622   APPEARANCEUR HAZY (A) 01/09/2017 1622   LABSPEC 1.018 01/09/2017 1622   PHURINE 5.0 01/09/2017 1622   GLUCOSEU NEGATIVE 01/09/2017 1622   HGBUR SMALL (A) 01/09/2017 1622   BILIRUBINUR NEGATIVE 01/09/2017 1622   KETONESUR NEGATIVE 01/09/2017 1622   PROTEINUR NEGATIVE 01/09/2017 1622   UROBILINOGEN 0.2 06/07/2011 1645   NITRITE NEGATIVE 01/09/2017 1622   LEUKOCYTESUR NEGATIVE 01/09/2017 1622     ----------------------------------------------------------------------------------------------------------------   Imaging Results:    Dg Chest 2 View  Result Date: 07/26/2017 CLINICAL DATA:  Chest pain and shortness of breath EXAM: CHEST  2 VIEW COMPARISON:  June 23, 2017. FINDINGS: There is no edema or consolidation. The heart size and pulmonary vascularity are normal. No adenopathy. There is aortic atherosclerosis. No evident bone lesions. IMPRESSION: Aortic atherosclerosis.  No edema or consolidation. Aortic Atherosclerosis (ICD10-I70.0). Electronically Signed   By: Lowella Grip III M.D.   On: 07/26/2017 19:02       Assessment & Plan:    Principal Problem:   Chest pain Active Problems:   Essential hypertension   Diabetes mellitus without complication (HCC)   Chest pain Tele Trop I q6h x3 Check cardiac echo NPO after mn Nuclear stress test in am Cardiology consult by email Cont metoprolol Start aspirin 325mg  po qday Start Lipitor 80mg  po qhs  Dm2 fsbs q4h, ISS Check hga1c Check lipid  Hypertension Cont Metoprolol  Gerd Cont PPI     DVT Prophylaxis   Lovenox - SCDs   AM Labs Ordered, also please review Full Orders  Family Communication: Admission, patients condition and plan of care including tests being ordered have been discussed with the patient who indicate understanding and agree with the plan and Code Status.  Code Status FULL CODE  Likely DC to  home  Condition GUARDED   Consults called:  Cardiology by email for AM  Admission status:observation  Time spent in minutes : 45   Jani Gravel M.D on 07/26/2017 at 11:35 PM  Between 7am to 7pm - Pager - [phone]   After 7pm go to [email or web address] - password [facility]  [facility] - Office  [phone]

## 2017-07-26 NOTE — ED Provider Notes (Signed)
[facility] EMERGENCY DEPARTMENT Provider Note   CSN: [id number] Arrival date & time: 07/26/17  1751     History   Chief Complaint Chief Complaint  Patient presents with  . Chest Pain    HPI Suzanne Gutierrez is a 52 y.o. female.  HPI  52 year old female with a history of diabetes, hypertension, and morbid obesity presents with chest pain.  Started around noon today but has overall been a problem since December.  Today it is more intense and is more persistently numb and painful in her left arm.  She also has associated shortness of breath and dizziness.  She been feeling weak and fatigued for months.  She saw Dr. Ellyn Hack at the end of last week and has a stress test set up for 1 week from today.  All of her symptoms are worse today.  Has noticed more exertional shortness of breath than typical today.  No leg pain or swelling.  No diaphoresis but has had nausea without vomiting.  Has not taken anything for the symptoms.  Pain is currently 5/10.  Past Medical History:  Diagnosis Date  . Bipolar disorder ([place])   . Diabetes mellitus without complication ([place])   . GERD (gastroesophageal reflux disease)   . Headache    Migraine  . Hypertension   . Lichen sclerosus of female genitalia   . Morbid obesity with BMI of 40.0-44.9, adult ([place])    Has been in the process of evaluation for GOP  . Osteoarthritis     Patient Active Problem List   Diagnosis Date Noted  . Diabetes mellitus without complication ([place]) 09/73/5329  . Essential hypertension 07/24/2017  . Left-sided chest wall pain 07/21/2017  . DOE (dyspnea on exertion) 07/21/2017  . Chest pain 07/21/2017  . Abnormal finding on EKG 07/21/2017    Past Surgical History:  Procedure Laterality Date  . APPENDECTOMY  1994  . CARDIAC CATHETERIZATION  2016   [facility] Olin Hauser --was told she had no significant disease.  Marland Kitchen CESAREAN SECTION    . HERNIA REPAIR     X2  . TUBAL LIGATION    . WISDOM TOOTH  EXTRACTION      OB History    Gravida Para Term Preterm AB Living   2 2 2     2    SAB TAB Ectopic Multiple Live Births                   Home Medications    Prior to Admission medications   Medication Sig Start Date End Date Taking? Authorizing Provider  desoximetasone (TOPICORT) 0.05 % cream Apply 1 application topically 2 (two) times daily.   Yes [provider]  escitalopram (LEXAPRO) 10 MG tablet Take 10 mg by mouth daily.   Yes [provider]  lidocaine (XYLOCAINE) 5 % ointment Apply 1 application topically as needed for mild pain. For numbing an area 07/24/17  Yes [provider]  metoprolol succinate (TOPROL-XL) 50 MG 24 hr tablet Take 50 mg by mouth daily. Take with or immediately following a meal.   Yes [provider]  Multiple Vitamin (MULTIVITAMIN WITH MINERALS) TABS tablet Take 1 tablet by mouth daily.   Yes [provider]  omeprazole (PRILOSEC) 20 MG capsule Take 20 mg by mouth daily.   Yes [provider]  predniSONE (DELTASONE) 10 MG tablet Take 6 tablets (60 mg total) by mouth daily for 2 days, THEN 4 tablets (40 mg total) daily for 2  days, THEN 2 tablets (20 mg total) daily for 2 days, THEN 1 tablet (10 mg total) daily for 2 days. 07/21/17 07/29/17 Yes Leonie Man, MD  zolpidem (AMBIEN) 10 MG tablet Take 5 mg by mouth at bedtime as needed for sleep.   Yes [provider]    Family History Family History  Problem Relation Age of Onset  . COPD Other   . Cancer Other   . Hypertension Other   . Stroke Other   . Diabetes Other   . Dementia Mother   . Diabetes Father        With diabetic retinopathy  . Stroke Father   . Stroke Sister   . Other Brother        3 brothers with unknown history    Social History Social History   Tobacco Use  . Smoking status: Never Smoker  . Smokeless tobacco: Never Used  Substance Use Topics  . Alcohol use: Yes    Alcohol/week: 1.2 oz    Types: 2 Glasses of  wine per week  . Drug use: No     Allergies   Codeine   Review of Systems Review of Systems  Constitutional: Negative for diaphoresis.  Respiratory: Positive for shortness of breath.   Cardiovascular: Positive for chest pain.  Gastrointestinal: Positive for nausea. Negative for abdominal pain and vomiting.  Musculoskeletal: Negative for back pain.  Neurological: Positive for numbness (left arm).  All other systems reviewed and are negative.    Physical Exam Updated Vital Signs BP 107/80   Pulse 61   Temp 98.4 F (36.9 C) (Oral)   Resp 20   SpO2 97%   Physical Exam  Constitutional: She is oriented to person, place, and time. She appears well-developed and well-nourished.  Non-toxic appearance. She does not appear ill. No distress.  Morbid obesity  HENT:  Head: Normocephalic and atraumatic.  Right Ear: External ear normal.  Left Ear: External ear normal.  Nose: Nose normal.  Eyes: Right eye exhibits no discharge. Left eye exhibits no discharge.  Cardiovascular: Normal rate, regular rhythm and normal heart sounds.  Pulses:      Radial pulses are 2+ on the right side, and 2+ on the left side.  Pulmonary/Chest: Effort normal and breath sounds normal. She exhibits no tenderness.  Abdominal: Soft. There is no tenderness.  Neurological: She is alert and oriented to person, place, and time.  Skin: Skin is warm and dry.  Nursing note and vitals reviewed.    ED Treatments / Results  Labs (all labs ordered are listed, but only abnormal results are displayed) Labs Reviewed  BASIC METABOLIC PANEL - Abnormal; Notable for the following components:      Result Value   Glucose, Bld 247 (*)    All other components within normal limits  CBC - Abnormal; Notable for the following components:   WBC 11.2 (*)    All other components within normal limits  CBG MONITORING, ED - Abnormal; Notable for the following components:   Glucose-Capillary 111 (*)    All other components  within normal limits  HIV ANTIBODY (ROUTINE TESTING)  COMPREHENSIVE METABOLIC PANEL  CBC  TROPONIN I  TROPONIN I  TROPONIN I  HEMOGLOBIN A1C  LIPID PANEL  I-STAT TROPONIN, ED  I-STAT TROPONIN, ED    EKG  EKG Interpretation  Date/Time:  Wednesday July 26 2017 21:18:27 EST Ventricular Rate:  57 PR Interval:  140 QRS Duration: 109 QT Interval:  439 QTC Calculation: 428 R  Axis:   74 Text Interpretation:  Sinus rhythm Baseline wander in lead(s) V3 nonspecific T waves. no significant change since earlier in the day Confirmed by Sherwood Gambler [id number]) on 07/26/2017 10:53:55 PM       Radiology Dg Chest 2 View  Result Date: 07/26/2017 CLINICAL DATA:  Chest pain and shortness of breath EXAM: CHEST  2 VIEW COMPARISON:  June 23, 2017. FINDINGS: There is no edema or consolidation. The heart size and pulmonary vascularity are normal. No adenopathy. There is aortic atherosclerosis. No evident bone lesions. IMPRESSION: Aortic atherosclerosis.  No edema or consolidation. Aortic Atherosclerosis (ICD10-I70.0). Electronically Signed   By: Lowella Grip III M.D.   On: 07/26/2017 19:02    Procedures Procedures (including critical care time)  Medications Ordered in ED Medications  escitalopram (LEXAPRO) tablet 10 mg (not administered)  metoprolol succinate (TOPROL-XL) 24 hr tablet 50 mg (not administered)  pantoprazole (PROTONIX) EC tablet 40 mg (not administered)  zolpidem (AMBIEN) tablet 5 mg (not administered)  aspirin EC tablet 325 mg (not administered)  atorvastatin (LIPITOR) tablet 80 mg (not administered)  enoxaparin (LOVENOX) injection 40 mg (not administered)  insulin aspart (novoLOG) injection 0-9 Units (not administered)  aspirin chewable tablet 324 mg (324 mg Oral Given 07/26/17 2139)  nitroGLYCERIN (NITROSTAT) SL tablet 0.4 mg (0.4 mg Sublingual Given 07/26/17 2252)  morphine 4 MG/ML injection 8 mg (8 mg Intravenous Given 07/26/17 2332)  ondansetron (ZOFRAN) injection  4 mg (4 mg Intravenous Given 07/26/17 2332)     Initial Impression / Assessment and Plan / ED Course  I have reviewed the triage vital signs and the nursing notes.  Pertinent labs & imaging results that were available during my care of the patient were reviewed by me and considered in my medical decision making (see chart for details).     Patient has had this pain on and off now for a few months but it seems to be much worse today.  She does not have any significant symptoms consistent with PE such as pleuritic pain or lower extremity DVT symptoms.  She was given nitroglycerin with no significant relief.  I discussed with cardiology, Dr. Teena Dunk, will advises to bump up her stress test to tomorrow by admitting her to hospitalist service.  Her ECG is unchanging and her troponins are negative.  I do not think she would warrant emergent cath at this time.  She will be given morphine given chest pain is not improved.  Dr. Maudie Mercury to admit.  Final Clinical Impressions(s) / ED Diagnoses   Final diagnoses:  Chest pressure    ED Discharge Orders    None       Sherwood Gambler, MD 07/27/17 [phone]

## 2017-07-26 NOTE — ED Triage Notes (Addendum)
Pt reports L upper cp which started about 2 hours ago.  She reports SOB, nausea, and dizziness as well.  She states she has been having same cp since December.  She endorses pressure in her chest that radiates to her L arm.  Pt is A&Ox 4.  In NAD.  She reports being see by her PCP last week Monday and had an abnormal EKG.  She was referred to [facility] Dr. Ellyn Hack and was there Friday last week where she was scheduled for Nuc Med study next month on the 5th.

## 2017-07-26 NOTE — ED Notes (Signed)
Pt to imaging

## 2017-07-27 ENCOUNTER — Observation Stay (HOSPITAL_BASED_OUTPATIENT_CLINIC_OR_DEPARTMENT_OTHER): Payer: 59

## 2017-07-27 ENCOUNTER — Telehealth (HOSPITAL_COMMUNITY): Payer: Self-pay

## 2017-07-27 ENCOUNTER — Observation Stay (HOSPITAL_COMMUNITY): Payer: 59

## 2017-07-27 DIAGNOSIS — R072 Precordial pain: Secondary | ICD-10-CM

## 2017-07-27 DIAGNOSIS — I10 Essential (primary) hypertension: Secondary | ICD-10-CM | POA: Diagnosis not present

## 2017-07-27 DIAGNOSIS — R079 Chest pain, unspecified: Secondary | ICD-10-CM | POA: Diagnosis not present

## 2017-07-27 HISTORY — PX: TRANSTHORACIC ECHOCARDIOGRAM: SHX275

## 2017-07-27 HISTORY — PX: NM MYOVIEW LTD: HXRAD82

## 2017-07-27 LAB — COMPREHENSIVE METABOLIC PANEL
ALK PHOS: 70 U/L (ref 38–126)
ALT: 27 U/L (ref 14–54)
AST: 21 U/L (ref 15–41)
Albumin: 3.4 g/dL — ABNORMAL LOW (ref 3.5–5.0)
Anion gap: 9 (ref 5–15)
BUN: 15 mg/dL (ref 6–20)
CALCIUM: 8.8 mg/dL — AB (ref 8.9–10.3)
CHLORIDE: 104 mmol/L (ref 101–111)
CO2: 28 mmol/L (ref 22–32)
CREATININE: 0.96 mg/dL (ref 0.44–1.00)
Glucose, Bld: 121 mg/dL — ABNORMAL HIGH (ref 65–99)
Potassium: 3.8 mmol/L (ref 3.5–5.1)
Sodium: 141 mmol/L (ref 135–145)
Total Bilirubin: 0.6 mg/dL (ref 0.3–1.2)
Total Protein: 6.2 g/dL — ABNORMAL LOW (ref 6.5–8.1)

## 2017-07-27 LAB — NM MYOCAR MULTI W/SPECT W/WALL MOTION / EF
CSEPED: 5 min
CSEPHR: 54 %
CSEPPHR: 92 {beats}/min
Estimated workload: 1 METS
MPHR: 169 {beats}/min
Rest HR: 54 {beats}/min

## 2017-07-27 LAB — CBC
HCT: 39.3 % (ref 36.0–46.0)
HEMOGLOBIN: 13 g/dL (ref 12.0–15.0)
MCH: 31.7 pg (ref 26.0–34.0)
MCHC: 33.1 g/dL (ref 30.0–36.0)
MCV: 95.9 fL (ref 78.0–100.0)
PLATELETS: 251 10*3/uL (ref 150–400)
RBC: 4.1 MIL/uL (ref 3.87–5.11)
RDW: 12.9 % (ref 11.5–15.5)
WBC: 6.6 10*3/uL (ref 4.0–10.5)

## 2017-07-27 LAB — LIPID PANEL
CHOL/HDL RATIO: 4 ratio
CHOLESTEROL: 151 mg/dL (ref 0–200)
HDL: 38 mg/dL — AB (ref >40)
LDL Cholesterol: 85 mg/dL (ref 0–99)
Triglycerides: 138 mg/dL (ref <150)
VLDL: 28 mg/dL (ref 0–40)

## 2017-07-27 LAB — HEMOGLOBIN A1C
Hgb A1c MFr Bld: 5.9 % — ABNORMAL HIGH (ref 4.8–5.6)
MEAN PLASMA GLUCOSE: 122.63 mg/dL

## 2017-07-27 LAB — TROPONIN I
Troponin I: <0.03 ng/mL (ref <0.03)
Troponin I: <0.03 ng/mL (ref <0.03)

## 2017-07-27 LAB — CBG MONITORING, ED
GLUCOSE-CAPILLARY: 102 mg/dL — AB (ref 65–99)
Glucose-Capillary: 111 mg/dL — ABNORMAL HIGH (ref 65–99)
Glucose-Capillary: 138 mg/dL — ABNORMAL HIGH (ref 65–99)

## 2017-07-27 LAB — ECHOCARDIOGRAM COMPLETE

## 2017-07-27 MED ORDER — NITROGLYCERIN 0.4 MG SL SUBL
0.4000 mg | SUBLINGUAL_TABLET | SUBLINGUAL | Status: DC | PRN
  Administered 2017-07-27: 0.4 mg via SUBLINGUAL
  Filled 2017-07-27: qty 1

## 2017-07-27 MED ORDER — ASPIRIN EC 325 MG PO TBEC
325.0000 mg | DELAYED_RELEASE_TABLET | Freq: Every day | ORAL | Status: DC
  Administered 2017-07-27: 325 mg via ORAL
  Filled 2017-07-27: qty 1

## 2017-07-27 MED ORDER — OMEPRAZOLE 20 MG PO CPDR: 40.0000 mg | DELAYED_RELEASE_CAPSULE | Freq: Every day | ORAL | 0 refills | Status: DC

## 2017-07-27 MED ORDER — MORPHINE SULFATE (PF) 4 MG/ML IV SOLN
2.0000 mg | INTRAVENOUS | Status: DC | PRN
  Filled 2017-07-27: qty 1

## 2017-07-27 MED ORDER — KETOROLAC TROMETHAMINE 30 MG/ML IJ SOLN
30.0000 mg | Freq: Once | INTRAMUSCULAR | Status: AC
  Administered 2017-07-27: 30 mg via INTRAVENOUS
  Filled 2017-07-27: qty 1

## 2017-07-27 MED ORDER — TECHNETIUM TC 99M TETROFOSMIN IV KIT
30.0000 | PACK | Freq: Once | INTRAVENOUS | Status: AC | PRN
  Administered 2017-07-27: 30 via INTRAVENOUS

## 2017-07-27 MED ORDER — ENOXAPARIN SODIUM 40 MG/0.4ML ~~LOC~~ SOLN
40.0000 mg | SUBCUTANEOUS | Status: DC
  Filled 2017-07-27 (×2): qty 0.4

## 2017-07-27 MED ORDER — METOPROLOL SUCCINATE ER 50 MG PO TB24
50.0000 mg | ORAL_TABLET | Freq: Every day | ORAL | Status: DC
  Administered 2017-07-27: 50 mg via ORAL
  Filled 2017-07-27 (×2): qty 1

## 2017-07-27 MED ORDER — TECHNETIUM TC 99M TETROFOSMIN IV KIT
10.0000 | PACK | Freq: Once | INTRAVENOUS | Status: AC | PRN
  Administered 2017-07-27: 10 via INTRAVENOUS

## 2017-07-27 MED ORDER — REGADENOSON 0.4 MG/5ML IV SOLN
INTRAVENOUS | Status: AC
  Administered 2017-07-27: 0.4 mg via INTRAVENOUS
  Filled 2017-07-27: qty 5

## 2017-07-27 MED ORDER — ESCITALOPRAM OXALATE 10 MG PO TABS: 10.0000 mg | ORAL_TABLET | Freq: Every day | ORAL | Status: DC

## 2017-07-27 MED ORDER — PANTOPRAZOLE SODIUM 40 MG PO TBEC
40.0000 mg | DELAYED_RELEASE_TABLET | Freq: Every day | ORAL | Status: DC
Start: 2017-07-27 — End: 2017-07-27
  Administered 2017-07-27: 40 mg via ORAL
  Filled 2017-07-27: qty 1

## 2017-07-27 MED ORDER — ZOLPIDEM TARTRATE 5 MG PO TABS
5.0000 mg | ORAL_TABLET | Freq: Every evening | ORAL | Status: DC | PRN
  Administered 2017-07-27: 5 mg via ORAL
  Filled 2017-07-27: qty 1

## 2017-07-27 MED ORDER — REGADENOSON 0.4 MG/5ML IV SOLN
0.4000 mg | Freq: Once | INTRAVENOUS | Status: AC
  Administered 2017-07-27: 0.4 mg via INTRAVENOUS
  Filled 2017-07-27: qty 5

## 2017-07-27 MED ORDER — IBUPROFEN 600 MG PO TABS: 600.0000 mg | ORAL_TABLET | Freq: Three times a day (TID) | ORAL | 1 refills | Status: DC | PRN

## 2017-07-27 MED ORDER — ATORVASTATIN CALCIUM 80 MG PO TABS
80.0000 mg | ORAL_TABLET | Freq: Every day | ORAL | Status: DC
  Filled 2017-07-27: qty 1

## 2017-07-27 MED ORDER — ACETAMINOPHEN 325 MG PO TABS
650.0000 mg | ORAL_TABLET | ORAL | Status: DC | PRN
  Administered 2017-07-27: 650 mg via ORAL
  Filled 2017-07-27: qty 2

## 2017-07-27 MED ORDER — ONDANSETRON HCL 4 MG/2ML IJ SOLN
4.0000 mg | Freq: Four times a day (QID) | INTRAMUSCULAR | Status: DC | PRN
  Filled 2017-07-27: qty 2

## 2017-07-27 NOTE — ED Notes (Signed)
Pt transported to nuclear medicine. 

## 2017-07-27 NOTE — ED Notes (Signed)
Pt states headache and chest pain are 1/10

## 2017-07-27 NOTE — ED Notes (Signed)
Pt staes chest pain resolved.

## 2017-07-27 NOTE — ED Notes (Signed)
Patient transported to Ultrasound 

## 2017-07-27 NOTE — ED Notes (Signed)
Pt states chest pai n is 0-1/10 jaw pain 1/10 and head pain 4/10

## 2017-07-27 NOTE — Progress Notes (Signed)
Patient was seen and examined Admitted with chest pain-which is easily reproducible with deep palpation. Seen earlier last week by Dr. Isaiah Blakes given Medrol Dosepak probable costochondritis. Troponins are negative, echo shows preserved EF.  Nuclear stress test was negative. She was given IV Toradol with significant relief of pain-we will try a course of nonsteroidal anti-inflammatory medications on discharge.

## 2017-07-27 NOTE — ED Notes (Signed)
Pt oob to bathroom with steady gait.Pyt statae head 4/10 chest 2/10 after movement.

## 2017-07-27 NOTE — Telephone Encounter (Signed)
Encounter complete. 

## 2017-07-27 NOTE — ED Notes (Signed)
Dr. Sloan Leiter contact with c/o continued chest pain and jaw pain with BP 94 after one nitro.. New orders given.

## 2017-07-27 NOTE — Discharge Summary (Signed)
PATIENT DETAILS Name: Suzanne Gutierrez Age: 52 y.o. Sex: female Date of Birth: Jan 04, 1966 MRN: [id number]. Admitting Physician: Jani Gravel, MD OBS:[id number], L.Marlou Sa, MD  Admit Date: 07/26/2017 Discharge date: 07/27/2017  Recommendations for Outpatient Follow-up:  1. Follow up with PCP in 1-2 weeks 2. Please obtain BMP/CBC in one week 3. Please ensure follow-up with cardiology-Dr. Ellyn Hack 4. If chest pain continues-may need more workup.  Admitted From:  Home   Disposition: [place]: No  Equipment/Devices: None  Discharge Condition: Stable  CODE STATUS: FULL CODE  Diet recommendation:  Heart Healthy  Brief Summary: See H&P, Labs, Consult and Test reports for all details in brief, patient was admitted for evaluation of atypical chest pain.  Brief Hospital Course: Chest pain: Suspect this is probably more musculoskeletal in nature as it is easily reproducible.  Troponins were negative.  Nuclear stress test did not show ischemia.  Echocardiogram showed preserved EF.  Spoke with cardiology team ([facility])-their PA has already assessed the patient with recommendations for outpatient follow-up.  Since patient had chest pain earlier today and was given Toradol with complete relief of the pain-have asked patient to take as needed ibuprofen.  She will follow-up with Dr. Ellyn Hack and her primary care practitioner for further continued workup if needed.  Hypertension: Continue metoprolol  GERD: Continue PPI  Rest of her medical issues were stable during this short hospital stay.  Procedures/Studies: None  Discharge Diagnoses:  Principal Problem:   Chest pain Active Problems:   Essential hypertension   Diabetes mellitus without complication [facility])   Discharge Instructions:  Activity:  As tolerated   Discharge Instructions    Call MD for:  persistant nausea and vomiting   Complete by:  As directed    Call MD for:  severe uncontrolled pain   Complete by:  As  directed    Diet - low sodium heart healthy   Complete by:  As directed    Discharge instructions   Complete by:  As directed    Follow with Primary MD  Alroy Dust, L.Marlou Sa, MD  In 1 week and Dr Ellyn Hack in 1-2 weeks.  Please get a complete blood count and chemistry panel checked by your Primary MD at your next visit, and again as instructed by your Primary MD.  Get Medicines reviewed and adjusted: Please take all your medications with you for your next visit with your Primary MD  Laboratory/radiological data: Please request your Primary MD to go over all hospital tests and procedure/radiological results at the follow up, please ask your Primary MD to get all Hospital records sent to his/her office.  In some cases, they will be blood work, cultures and biopsy results pending at the time of your discharge. Please request that your primary care M.D. follows up on these results.  Also Note the following: If you experience worsening of your admission symptoms, develop shortness of breath, life threatening emergency, suicidal or homicidal thoughts you must seek medical attention immediately by calling 911 or calling your MD immediately  if symptoms less severe.  You must read complete instructions/literature along with all the possible adverse reactions/side effects for all the Medicines you take and that have been prescribed to you. Take any new Medicines after you have completely understood and accpet all the possible adverse reactions/side effects.   Do not drive when taking Pain medications or sleeping medications (Benzodaizepines)  Do not take more than prescribed Pain, Sleep and Anxiety Medications. It is not advisable to combine  anxiety,sleep and pain medications without talking with your primary care practitioner  Special Instructions: If you have smoked or chewed Tobacco  in the last 2 yrs please stop smoking, stop any regular Alcohol  and or any Recreational drug use.  Wear Seat belts  while driving.  Please note: You were cared for by a hospitalist during your hospital stay. Once you are discharged, your primary care physician will handle any further medical issues. Please note that NO REFILLS for any discharge medications will be authorized once you are discharged, as it is imperative that you return to your primary care physician (or establish a relationship with a primary care physician if you do not have one) for your post hospital discharge needs so that they can reassess your need for medications and monitor your lab values.   Increase activity slowly   Complete by:  As directed      Allergies as of 07/27/2017      Reactions   Codeine Nausea And Vomiting      Medication List    STOP taking these medications   predniSONE 10 MG tablet Commonly known as:  DELTASONE     TAKE these medications   desoximetasone 0.05 % cream Commonly known as:  TOPICORT Apply 1 application topically 2 (two) times daily.   escitalopram 10 MG tablet Commonly known as:  LEXAPRO Take 10 mg by mouth daily.   ibuprofen 600 MG tablet Commonly known as:  ADVIL,MOTRIN Take 1 tablet (600 mg total) by mouth every 8 (eight) hours as needed.   lidocaine 5 % ointment Commonly known as:  XYLOCAINE Apply 1 application topically as needed for mild pain. For numbing an area   metoprolol succinate 50 MG 24 hr tablet Commonly known as:  TOPROL-XL Take 50 mg by mouth daily. Take with or immediately following a meal.   multivitamin with minerals Tabs tablet Take 1 tablet by mouth daily.   omeprazole 20 MG capsule Commonly known as:  PRILOSEC Take 2 capsules (40 mg total) by mouth daily. What changed:  how much to take   zolpidem 10 MG tablet Commonly known as:  AMBIEN Take 5 mg by mouth at bedtime as needed for sleep.      Follow-up Information    Alroy Dust, L.Marlou Sa, MD. Schedule an appointment as soon as possible for a visit in 1 week(s).   Specialty:  Family Medicine Contact  information: [address] [place] [address] [phone]        Leonie Man, MD. Schedule an appointment as soon as possible for a visit in 2 week(s).   Specialty:  Cardiology Contact information: [address] [place] [address] [place] [address] [phone]          Allergies  Allergen Reactions  . Codeine Nausea And Vomiting   Consultations:   cardiology  Other Procedures/Studies: Dg Chest 2 View  Result Date: 07/26/2017 CLINICAL DATA:  Chest pain and shortness of breath EXAM: CHEST  2 VIEW COMPARISON:  June 23, 2017. FINDINGS: There is no edema or consolidation. The heart size and pulmonary vascularity are normal. No adenopathy. There is aortic atherosclerosis. No evident bone lesions. IMPRESSION: Aortic atherosclerosis.  No edema or consolidation. Aortic Atherosclerosis (ICD10-I70.0). Electronically Signed   By: Lowella Grip III M.D.   On: 07/26/2017 19:02   Nm Myocar Multi W/spect W/wall Motion / Ef  Result Date: 07/27/2017 CLINICAL DATA:  52 year old female with a history of chest pain. Cardiovascular risk factors include diabetes, hypertension  EXAM: MYOCARDIAL IMAGING WITH SPECT (REST AND PHARMACOLOGIC-STRESS) GATED LEFT VENTRICULAR WALL MOTION STUDY LEFT VENTRICULAR EJECTION FRACTION TECHNIQUE: Standard myocardial SPECT imaging was performed after resting intravenous injection of 10 mCi Tc-105m tetrofosmin. Subsequently, intravenous infusion of Lexiscan was performed under the supervision of the Cardiology staff. At peak effect of the drug, 30 mCi Tc-57m tetrofosmin was injected intravenously and standard myocardial SPECT imaging was performed. Quantitative gated imaging was also performed to evaluate left ventricular wall motion, and estimate left ventricular ejection fraction. COMPARISON:  None. FINDINGS: Perfusion: No reversible perfusion defect identified. Small mild fixed defect at the lateral wall towards the apex. Wall Motion: Normal  left ventricular wall motion. No left ventricular dilation. Left Ventricular Ejection Fraction: 67 % End diastolic volume 79 ml End systolic volume 26 ml IMPRESSION: 1. No reversible perfusion defect identified. There is a small fixed defect, mild severity, at the lateral wall towards the apex. 2. Normal left ventricular wall motion. 3. Left ventricular ejection fraction 67% 4. Non invasive risk stratification*: Low *2012 Appropriate Use Criteria for Coronary Revascularization Focused Update: J Am Coll Cardiol. 0160;10(9):323-557. http://content.[email or web address]?articleid=[email or web address] Electronically Signed   By: Corrie Mckusick D.O.   On: 07/27/2017 11:28      TODAY-DAY OF DISCHARGE:  Subjective:   Suzanne Gutierrez today has no headache,no chest abdominal pain,no new weakness tingling or numbness, feels much better wants to go home today.   Objective:   Blood pressure 107/74, pulse 62, temperature 98.4 F (36.9 C), temperature source Oral, resp. rate 16, SpO2 95 %. No intake or output data in the 24 hours ending 07/27/17 1622 There were no vitals filed for this visit.  Exam: Awake Alert, Oriented *3, No new F.N deficits, Normal affect [place].AT,PERRAL Supple Neck,No JVD, No cervical lymphadenopathy appriciated.  Symmetrical Chest wall movement, Good air movement bilaterally, CTAB RRR,No Gallops,Rubs or new Murmurs, No Parasternal Heave +ve B.Sounds, Abd Soft, Non tender, No organomegaly appriciated, No rebound -guarding or rigidity. No Cyanosis, Clubbing or edema, No new Rash or bruise   PERTINENT RADIOLOGIC STUDIES: Dg Chest 2 View  Result Date: 07/26/2017 CLINICAL DATA:  Chest pain and shortness of breath EXAM: CHEST  2 VIEW COMPARISON:  June 23, 2017. FINDINGS: There is no edema or consolidation. The heart size and pulmonary vascularity are normal. No adenopathy. There is aortic atherosclerosis. No evident bone lesions. IMPRESSION: Aortic atherosclerosis.  No edema or  consolidation. Aortic Atherosclerosis (ICD10-I70.0). Electronically Signed   By: Lowella Grip III M.D.   On: 07/26/2017 19:02   Nm Myocar Multi W/spect W/wall Motion / Ef  Result Date: 07/27/2017 CLINICAL DATA:  52 year old female with a history of chest pain. Cardiovascular risk factors include diabetes, hypertension EXAM: MYOCARDIAL IMAGING WITH SPECT (REST AND PHARMACOLOGIC-STRESS) GATED LEFT VENTRICULAR WALL MOTION STUDY LEFT VENTRICULAR EJECTION FRACTION TECHNIQUE: Standard myocardial SPECT imaging was performed after resting intravenous injection of 10 mCi Tc-44m tetrofosmin. Subsequently, intravenous infusion of Lexiscan was performed under the supervision of the Cardiology staff. At peak effect of the drug, 30 mCi Tc-42m tetrofosmin was injected intravenously and standard myocardial SPECT imaging was performed. Quantitative gated imaging was also performed to evaluate left ventricular wall motion, and estimate left ventricular ejection fraction. COMPARISON:  None. FINDINGS: Perfusion: No reversible perfusion defect identified. Small mild fixed defect at the lateral wall towards the apex. Wall Motion: Normal left ventricular wall motion. No left ventricular dilation. Left Ventricular Ejection Fraction: 67 % End diastolic volume 79 ml End systolic volume 26 ml IMPRESSION: 1. No reversible perfusion defect identified.  There is a small fixed defect, mild severity, at the lateral wall towards the apex. 2. Normal left ventricular wall motion. 3. Left ventricular ejection fraction 67% 4. Non invasive risk stratification*: Low *2012 Appropriate Use Criteria for Coronary Revascularization Focused Update: J Am Coll Cardiol. 0938;18(2):993-716. http://content.airportbarriers.com.aspx?articleid=[email or web address] Electronically Signed   By: Corrie Mckusick D.O.   On: 07/27/2017 11:28     PERTINENT LAB RESULTS: CBC: Recent Labs    07/26/17 1759 07/27/17 1149  WBC 11.2* 6.6  HGB 13.3 13.0  HCT 39.8 39.3  PLT  325 251   CMET CMP     Component Value Date/Time   NA 141 07/27/2017 0612   K 3.8 07/27/2017 0612   CL 104 07/27/2017 0612   CO2 28 07/27/2017 0612   GLUCOSE 121 (H) 07/27/2017 0612   BUN 15 07/27/2017 0612   CREATININE 0.96 07/27/2017 0612   CALCIUM 8.8 (L) 07/27/2017 0612   PROT 6.2 (L) 07/27/2017 0612   ALBUMIN 3.4 (L) 07/27/2017 0612   AST 21 07/27/2017 0612   ALT 27 07/27/2017 0612   ALKPHOS 70 07/27/2017 0612   BILITOT 0.6 07/27/2017 0612   GFRNONAA >60 07/27/2017 0612   GFRAA >60 07/27/2017 0612    GFR Estimated Creatinine Clearance: 77.4 mL/min (by C-G formula based on SCr of 0.96 mg/dL). No results for input(s): LIPASE, AMYLASE in the last 72 hours. Recent Labs    07/27/17 0002 07/27/17 0612 07/27/17 1149  TROPONINI <0.03 <0.03 <0.03   Invalid input(s): POCBNP No results for input(s): DDIMER in the last 72 hours. Recent Labs    07/27/17 0143  HGBA1C 5.9*   Recent Labs    07/27/17 0612  CHOL 151  HDL 38*  LDLCALC 85  TRIG 138  CHOLHDL 4.0   No results for input(s): TSH, T4TOTAL, T3FREE, THYROIDAB in the last 72 hours.  Invalid input(s): FREET3 No results for input(s): VITAMINB12, FOLATE, FERRITIN, TIBC, IRON, RETICCTPCT in the last 72 hours. Coags: No results for input(s): INR in the last 72 hours.  Invalid input(s): PT Microbiology: No results found for this or any previous visit (from the past 240 hour(s)).  FURTHER DISCHARGE INSTRUCTIONS:  Get Medicines reviewed and adjusted: Please take all your medications with you for your next visit with your Primary MD  Laboratory/radiological data: Please request your Primary MD to go over all hospital tests and procedure/radiological results at the follow up, please ask your Primary MD to get all Hospital records sent to his/her office.  In some cases, they will be blood work, cultures and biopsy results pending at the time of your discharge. Please request that your primary care M.D. goes through  all the records of your hospital data and follows up on these results.  Also Note the following: If you experience worsening of your admission symptoms, develop shortness of breath, life threatening emergency, suicidal or homicidal thoughts you must seek medical attention immediately by calling 911 or calling your MD immediately  if symptoms less severe.  You must read complete instructions/literature along with all the possible adverse reactions/side effects for all the Medicines you take and that have been prescribed to you. Take any new Medicines after you have completely understood and accpet all the possible adverse reactions/side effects.   Do not drive when taking Pain medications or sleeping medications (Benzodaizepines)  Do not take more than prescribed Pain, Sleep and Anxiety Medications. It is not advisable to combine anxiety,sleep and pain medications without talking with your primary care practitioner  Special Instructions:  If you have smoked or chewed Tobacco  in the last 2 yrs please stop smoking, stop any regular Alcohol  and or any Recreational drug use.  Wear Seat belts while driving.  Please note: You were cared for by a hospitalist during your hospital stay. Once you are discharged, your primary care physician will handle any further medical issues. Please note that NO REFILLS for any discharge medications will be authorized once you are discharged, as it is imperative that you return to your primary care physician (or establish a relationship with a primary care physician if you do not have one) for your post hospital discharge needs so that they can reassess your need for medications and monitor your lab values.  Total Time spent coordinating discharge including counseling, education and face to face time equals 30 minutes. SignedOren Binet 07/27/2017 4:22 PM

## 2017-07-27 NOTE — Progress Notes (Signed)
  Echocardiogram 2D Echocardiogram has been performed.  07/27/2017, 2:47 PM

## 2017-07-27 NOTE — Discharge Instructions (Signed)

## 2017-07-27 NOTE — Progress Notes (Signed)
   Stress test low risk. Echo also normal with normal LVEF. No structural abnormalities. Pt and RN notified of results. No indication for further cardiac testing. Pt should keep her outpatient clinic appt with Dr. Ellyn Hack in 2 weeks.   Luc Shammas Rosita Fire 07/27/2017

## 2017-07-27 NOTE — ED Notes (Signed)
Pain improved 

## 2017-07-27 NOTE — ED Notes (Addendum)
Called to room and pt states increaseing chest pain and increased right jaw pain.Pt denies nausea, sweating but indorses dizziness. Remain a&o with resp clear and even and no labored.will inform MD

## 2017-07-27 NOTE — ED Notes (Signed)
Pt now states chest pain free with decreased creased jaw pain 3/10. Head ache now 5/10

## 2017-07-31 LAB — HIV 1/2 AB DIFFERENTIATION
HIV 1 Ab: NEGATIVE
HIV 2 Ab: NEGATIVE
NOTE (HIV CONF MULTISPOT): NEGATIVE

## 2017-07-31 LAB — HIV ANTIBODY (ROUTINE TESTING W REFLEX): HIV SCREEN 4TH GENERATION: REACTIVE — AB

## 2017-07-31 LAB — RNA QUALITATIVE

## 2017-08-01 ENCOUNTER — Ambulatory Visit (HOSPITAL_COMMUNITY): Admission: RE | Admit: 2017-08-01 | Payer: 59 | Source: Ambulatory Visit | Attending: Cardiology | Admitting: Cardiology

## 2017-08-02 ENCOUNTER — Ambulatory Visit (HOSPITAL_COMMUNITY): Payer: 59

## 2017-08-08 ENCOUNTER — Encounter: Payer: Self-pay | Admitting: Cardiology

## 2017-08-08 ENCOUNTER — Ambulatory Visit (INDEPENDENT_AMBULATORY_CARE_PROVIDER_SITE_OTHER): Payer: 59 | Admitting: Cardiology

## 2017-08-08 VITALS — BP 124/90 | HR 96 | Ht 62.0 in | Wt 220.0 lb

## 2017-08-08 DIAGNOSIS — R0789 Other chest pain: Secondary | ICD-10-CM

## 2017-08-08 DIAGNOSIS — I700 Atherosclerosis of aorta: Secondary | ICD-10-CM | POA: Diagnosis not present

## 2017-08-08 DIAGNOSIS — M940 Chondrocostal junction syndrome [Tietze]: Secondary | ICD-10-CM | POA: Diagnosis not present

## 2017-08-08 DIAGNOSIS — R739 Hyperglycemia, unspecified: Secondary | ICD-10-CM | POA: Diagnosis not present

## 2017-08-08 DIAGNOSIS — I10 Essential (primary) hypertension: Secondary | ICD-10-CM | POA: Diagnosis not present

## 2017-08-08 DIAGNOSIS — R079 Chest pain, unspecified: Secondary | ICD-10-CM | POA: Diagnosis not present

## 2017-08-08 NOTE — Progress Notes (Signed)
PCP: Alroy Dust, L.Marlou Sa, MD  Clinic Note: Chief Complaint  Patient presents with  . Hospitalization Follow-up    Admitted with chest pain, had Myoview  . Chest Pain    chest pressure randomly.    HPI: Suzanne Gutierrez is a 52 y.o. female who is being seen today for follow-up evaluation of Chest Pain with an Abnormal EKG --initially seen on she was on February 22 at the request of Artist Pais, [place] for [place], L.Marlou Sa, MD. She has been evaluated for chest pain in the past maybe 6 years ago with stress test that was negative, followed by a heart catheterization in 2016 while vacationing at [facility].   She was seen on February 19 by Artist Pais, [place].  -She noted that having chest pain in the setting of trying to start an exercise plan using the stationary bike, and has been working with a nutritionist.  She indicated that she was noticing some chest pressure (not associated with exertion because she does not do much).  This is associated with a feeling of shortness of breath and fatigue.  Symptoms have been ongoing for a few weeks.  Theola Sequin was seen on February 22 for evaluation of chest pain -- Several different types of chest discomfort including a left-sided pain radiating to the mid axillary line also pain and numbness in the left arm and neck --> they are not mutually exclusive and can happen at the same time.   Recent Hospitalizations:   Admitted to the hospital for chest pain on February 27 --During this hospitalization she had her Myoview done  Studies Personally Reviewed - (if available, images/films reviewed: From Epic Chart or Care Everywhere)  2D echo EF 60-65% July 27, 2017: No regional wall motion abnormality.  (Poor quality).  Mild RV dilation.  Mild LA dilation.  Myoview 07/27/2017: LOW risk.  EF 67%.  No reversible perfusion noted.  There is a fixed defect in the lateral wall toward the apex likely breast attenuation.  Interval History: Suzanne Gutierrez presents  here today indicating that she still has some of the pain but it is somewhat better at this point.  She says that mostly she is bothered by the left arm pain in the axilla area.  It is more upper chest and axillary pain than actual chest pain.  There is also a very notable pain in the center of the left side of her breastbone.  Overall, and the symptoms are improved and she is happy with the results of her stress test showing no evidence of ischemia.  The pain is exacerbated by certain movements and deep breathing/coughing, but not necessarily worse with exertion.  She denies any  Associated dyspnea.  No PND, orthopnea or edema.  No rapid irregular heartbeats or palpitations.  No syncope/near syncope or TIA/amaurosis fugax.  No claudication.  ROS: A comprehensive was performed. Review of Systems  Constitutional: Positive for malaise/fatigue (Just no energy to do anything). Negative for chills and fever.  HENT: Negative for congestion and nosebleeds.   Eyes: Negative.   Respiratory: Negative for shortness of breath (Per HPI).   Gastrointestinal: Negative for abdominal pain, blood in stool, heartburn and melena.  Genitourinary: Negative for hematuria.  Musculoskeletal: Positive for myalgias (Left arm/shoulder pain) and neck pain (Left upper neck). Negative for falls.  Skin: Negative for rash (Lichen sclerosus -of vulva).  Neurological: Positive for headaches. Negative for dizziness and weakness.  Psychiatric/Behavioral: Negative for memory loss. The patient is nervous/anxious. The patient does not  have insomnia.   All other systems reviewed and are negative.   I have reviewed and (if needed) personally updated the patient's problem list, medications, allergies, past medical and surgical history, social and family history.   Past Medical History:  Diagnosis Date  . Bipolar disorder ([place])   . Diabetes mellitus without complication ([place])   . GERD (gastroesophageal reflux disease)   . Headache     Migraine  . Hypertension   . Lichen sclerosus of female genitalia   . Morbid obesity with BMI of 40.0-44.9, adult ([place])    Has been in the process of evaluation for GOP  . Osteoarthritis     Past Surgical History:  Procedure Laterality Date  . APPENDECTOMY  1994  . CARDIAC CATHETERIZATION  2016   [facility] Olin Hauser --was told she had no significant disease.  Marland Kitchen CESAREAN SECTION    . HERNIA REPAIR     X2  . NM MYOVIEW LTD  07/27/2017    LOW risk.  EF 67%.  No reversible perfusion noted.  There is a fixed defect in the lateral wall toward the apex likely breast attenuation.  . TRANSTHORACIC ECHOCARDIOGRAM  07/27/2017   No regional wall motion abnormality.  (Poor quality).  Mild RV dilation.  Mild LA dilation.  . TUBAL LIGATION    . WISDOM TOOTH EXTRACTION      Current Meds  Medication Sig  . desoximetasone (TOPICORT) 0.05 % cream Apply 1 application topically 2 (two) times daily.  Marland Kitchen escitalopram (LEXAPRO) 10 MG tablet Take 10 mg by mouth daily.  Marland Kitchen ibuprofen (ADVIL,MOTRIN) 600 MG tablet Take 1 tablet (600 mg total) by mouth every 8 (eight) hours as needed.  . lidocaine (XYLOCAINE) 5 % ointment Apply 1 application topically as needed for mild pain. For numbing an area  . metoprolol succinate (TOPROL-XL) 50 MG 24 hr tablet Take 50 mg by mouth daily. Take with or immediately following a meal.  . Multiple Vitamin (MULTIVITAMIN WITH MINERALS) TABS tablet Take 1 tablet by mouth daily.  Marland Kitchen omeprazole (PRILOSEC) 20 MG capsule Take 2 capsules (40 mg total) by mouth daily.  Marland Kitchen zolpidem (AMBIEN) 10 MG tablet Take 5 mg by mouth at bedtime as needed for sleep.    Allergies  Allergen Reactions  . Codeine Nausea And Vomiting    Social History   Tobacco Use  . Smoking status: Never Smoker  . Smokeless tobacco: Never Used  Substance Use Topics  . Alcohol use: Yes    Alcohol/week: 1.2 oz    Types: 2 Glasses of wine per week  . Drug use: No   Social History   Social History  Narrative   She tries to exercise at least once a week doing 50 minutes on a stationary bike.  Otherwise no routine exercise.    family history includes COPD in her other; Cancer in her other; Dementia in her mother; Diabetes in her father and other; Hypertension in her other; Other in her brother; Stroke in her father, other, and sister.  Wt Readings from Last 3 Encounters:  08/08/17 220 lb (99.8 kg)  07/21/17 224 lb 3.2 oz (101.7 kg)  07/18/17 223 lb (101.2 kg)    PHYSICAL EXAM BP 124/90   Pulse 96   Ht 5\' 2"  (1.575 m)   Wt 220 lb (99.8 kg)   BMI 40.24 kg/m   Physical Exam  Constitutional: She is oriented to person, place, and time. She appears well-developed and well-nourished. No distress.  Morbidly obese.  Well-groomed  HENT:  Head: Normocephalic and atraumatic.  Neck: Normal range of motion. No hepatojugular reflux and no JVD present. Carotid bruit is not present. No thyromegaly present.  Cardiovascular: Normal rate, regular rhythm, normal heart sounds, intact distal pulses and normal pulses.  No extrasystoles are present. PMI is not displaced (Cannot palpate). Exam reveals no gallop and no friction rub.  No murmur heard. Pulmonary/Chest: Effort normal and breath sounds normal. No respiratory distress. She has no wheezes. She has no rales. She exhibits tenderness (Along the left sternal costal border radiating along the upper ribs.).  Abdominal: Soft. Bowel sounds are normal. She exhibits no distension. There is no tenderness.  Obese  Musculoskeletal: Edema: Trivial.  Neurological: She is alert and oriented to person, place, and time. No cranial nerve deficit.  Skin: Skin is warm and dry.  Psychiatric: She has a normal mood and affect. Her behavior is normal. Judgment and thought content normal.  Nursing note and vitals reviewed.   Adult ECG Report -None   Other studies Reviewed: Additional studies/ records that were reviewed today include:  Recent Labs: July 18, 2017  Na+ 139, K+ 4.4, Cl- 104, HCO3-27, BUN 13, Cr 0.8, Glu 123, Ca2+ 9.7;   Lipids not available   ASSESSMENT / PLAN: Problem List Items Addressed This Visit    Left-sided chest wall pain    Chest pain negative that is reproducible on exam and with negative Myoview normal echocardiogram most likely musculoskeletal/costochondritis.  NSAIDs taper        Essential hypertension (Chronic)    Well-controlled on beta-blocker.      Costochondritis - Primary    The she had recurrence of chest pain on February 27 was admitted to the hospital.  Reproducible chest pain on palpation were consistent with costochondritis.  We will treat with a standard ibuprofen Taper -800 followed by 600,  400, and 200 mg 3 times daily for 3 days.          Current medicines are reviewed at length with the patient today. (+/- concerns) CP The following changes have been made: Steroid Taper  Patient Instructions  MEDICATION INSTRUCTIONS  WHEN YOU TAKE EACH DOSE OF IBUPROFEN , EAT SOMETHING AND DRINK GLASS OR BOTTLE OF WATER. Take Ibuprofen taper dose 800 mg three times a day for 3 days 600 mg three times a day for 3 days 400 mg three times a day for 3 days 200 mg twice times a day for 2 days THEN STOP    Your physician recommends that you schedule a follow-up appointment on an as needed basis.    Costochondritis Costochondritis is swelling and irritation (inflammation) of the tissue (cartilage) that connects your ribs to your breastbone (sternum). This causes pain in the front of your chest. Usually, the pain:  Starts gradually.  Is in more than one rib.  This condition usually goes away on its own over time. Follow these instructions at home:  Do not do anything that makes your pain worse.  If directed, put ice on the painful area: ? Put ice in a plastic bag. ? Place a towel between your skin and the bag. ? Leave the ice on for 20 minutes, 2-3 times a day.  If directed, put heat  on the affected area as often as told by your doctor. Use the heat source that your doctor tells you to use, such as a moist heat pack or a heating pad. ? Place a towel between your skin and the heat  source. ? Leave the heat on for 20-30 minutes. ? Take off the heat if your skin turns bright red. This is very important if you cannot feel pain, heat, or cold. You may have a greater risk of getting burned.  Take over-the-counter and prescription medicines only as told by your doctor.  Return to your normal activities as told by your doctor. Ask your doctor what activities are safe for you.  Keep all follow-up visits as told by your doctor. This is important. Contact a doctor if:  You have chills or a fever.  Your pain does not go away or it gets worse.  You have a cough that does not go away. Get help right away if:  You are short of breath. This information is not intended to replace advice given to you by your health care provider. Make sure you discuss any questions you have with your health care provider. Document Released: 11/02/2007 Document Revised: 12/04/2015 Document Reviewed: 09/09/2015 Elsevier Interactive Patient Education  [employer].        Studies Ordered:   No orders of the defined types were placed in this encounter.     Glenetta Hew, M.D., M.S. Interventional Cardiologist   Pager # [phone] Phone # [phone] [address] [place], [place] [address]   Thank you for choosing [facility] at [facility]!!

## 2017-08-08 NOTE — Patient Instructions (Signed)
MEDICATION INSTRUCTIONS  WHEN YOU TAKE EACH DOSE OF IBUPROFEN , EAT SOMETHING AND DRINK GLASS OR BOTTLE OF WATER. Take Ibuprofen taper dose 800 mg three times a day for 3 days 600 mg three times a day for 3 days 400 mg three times a day for 3 days 200 mg twice times a day for 2 days THEN STOP    Your physician recommends that you schedule a follow-up appointment on an as needed basis.    Costochondritis Costochondritis is swelling and irritation (inflammation) of the tissue (cartilage) that connects your ribs to your breastbone (sternum). This causes pain in the front of your chest. Usually, the pain:  Starts gradually.  Is in more than one rib.  This condition usually goes away on its own over time. Follow these instructions at home:  Do not do anything that makes your pain worse.  If directed, put ice on the painful area: ? Put ice in a plastic bag. ? Place a towel between your skin and the bag. ? Leave the ice on for 20 minutes, 2-3 times a day.  If directed, put heat on the affected area as often as told by your doctor. Use the heat source that your doctor tells you to use, such as a moist heat pack or a heating pad. ? Place a towel between your skin and the heat source. ? Leave the heat on for 20-30 minutes. ? Take off the heat if your skin turns bright red. This is very important if you cannot feel pain, heat, or cold. You may have a greater risk of getting burned.  Take over-the-counter and prescription medicines only as told by your doctor.  Return to your normal activities as told by your doctor. Ask your doctor what activities are safe for you.  Keep all follow-up visits as told by your doctor. This is important. Contact a doctor if:  You have chills or a fever.  Your pain does not go away or it gets worse.  You have a cough that does not go away. Get help right away if:  You are short of breath. This information is not intended to replace advice given  to you by your health care provider. Make sure you discuss any questions you have with your health care provider. Document Released: 11/02/2007 Document Revised: 12/04/2015 Document Reviewed: 09/09/2015 Elsevier Interactive Patient Education  [employer].

## 2017-08-10 ENCOUNTER — Encounter: Payer: Self-pay | Admitting: Cardiology

## 2017-08-10 NOTE — Assessment & Plan Note (Signed)
Chest pain negative that is reproducible on exam and with negative Myoview normal echocardiogram most likely musculoskeletal/costochondritis.  NSAIDs taper

## 2017-08-10 NOTE — Assessment & Plan Note (Signed)
The she had recurrence of chest pain on February 27 was admitted to the hospital.  Reproducible chest pain on palpation were consistent with costochondritis.  We will treat with a standard ibuprofen Taper -800 followed by 600,  400, and 200 mg 3 times daily for 3 days.

## 2017-08-10 NOTE — Assessment & Plan Note (Signed)
Well-controlled on beta-blocker. 

## 2017-08-15 ENCOUNTER — Ambulatory Visit: Payer: 59 | Admitting: Skilled Nursing Facility1

## 2017-08-16 ENCOUNTER — Encounter: Payer: Self-pay | Admitting: Skilled Nursing Facility1

## 2017-08-16 ENCOUNTER — Encounter: Payer: 59 | Attending: General Surgery | Admitting: Skilled Nursing Facility1

## 2017-08-16 DIAGNOSIS — Z713 Dietary counseling and surveillance: Secondary | ICD-10-CM | POA: Insufficient documentation

## 2017-08-16 DIAGNOSIS — E669 Obesity, unspecified: Secondary | ICD-10-CM

## 2017-08-16 NOTE — Progress Notes (Signed)
Appt start time: 12:11 end time: 12:25  Assessment: 4th SW[facility] Appointment.  Start Wt at [facility]: 225.1 Wt: 219.2 BMI: 40.09  Pt arrives having lost about 4 pounds. Pt states he had issues with chest pain and arm pain so she went to the hospital and found out she had high blood sugars (still in prediabetes range). Pt states she has back slid a little bit with her water. Pt states she struggles with motivation.   MEDICATIONS: See list   DIETARY INTAKE:  24-hr recall:  B (9-10 AM): pure protein bar  Snk ( AM): sometimes nuts L (1-2 PM): turkey/cheese roll-ups, veggie straws, fruit cup or peanut butter Snk ( PM): protein shake D ( PM): tomato basil soup, rolls or hibachi shrimp, vegetables, noodles Snk ( PM): none Beverages: water, coffee (1 cup), hot tea, premier protein  Usual physical activity: none stated  Diet to Follow: 1600 calories 180 g carbohydrates 120 g protein 44 g fat  Preferred Learning Style:   No preference indicated   Learning Readiness:   Ready  Change in progress  Nutritional Diagnosis:  [place]-3.3 Overweight/obesity related to past poor dietary habits and physical inactivity as evidenced by patient w/ planned RYGB surgery following dietary guidelines for continued weight loss.    Intervention:  Nutrition counseling for upcoming Bariatric Surgery.  Goals:  - Aim to chew at least 30 times per bite or to applesauce consistency.  - Practice mindful eating. Using the needs and emotions sheet - Keep up the great work with what you're already doing!  Teaching Method Utilized:  Visual Auditory Hands on  Handouts given during visit include: - none  Barriers to learning/adherence to lifestyle change: none  Demonstrated degree of understanding via:  Teach Back   Monitoring/Evaluation:  Dietary intake, exercise, and body weight in 1 month(s).

## 2017-09-11 ENCOUNTER — Other Ambulatory Visit: Payer: 59

## 2017-09-11 ENCOUNTER — Other Ambulatory Visit: Payer: Self-pay | Admitting: Family Medicine

## 2017-09-11 ENCOUNTER — Encounter: Payer: 59 | Attending: General Surgery | Admitting: Skilled Nursing Facility1

## 2017-09-11 ENCOUNTER — Ambulatory Visit
Admission: RE | Admit: 2017-09-11 | Discharge: 2017-09-11 | Disposition: A | Discharge status: Home / Self Care | Payer: 59 | Source: Ambulatory Visit | Attending: Family Medicine | Admitting: Family Medicine

## 2017-09-11 DIAGNOSIS — Z713 Dietary counseling and surveillance: Secondary | ICD-10-CM | POA: Diagnosis not present

## 2017-09-11 DIAGNOSIS — R109 Unspecified abdominal pain: Secondary | ICD-10-CM

## 2017-09-11 DIAGNOSIS — K760 Fatty (change of) liver, not elsewhere classified: Secondary | ICD-10-CM | POA: Diagnosis not present

## 2017-09-11 DIAGNOSIS — E669 Obesity, unspecified: Secondary | ICD-10-CM

## 2017-09-11 DIAGNOSIS — R350 Frequency of micturition: Secondary | ICD-10-CM | POA: Diagnosis not present

## 2017-09-11 DIAGNOSIS — R197 Diarrhea, unspecified: Secondary | ICD-10-CM | POA: Diagnosis not present

## 2017-09-11 NOTE — Progress Notes (Signed)
Appt start time: 12:11 end time: 12:25  Assessment: 6th SWL Appointment.  Start Wt at [facility]: 225.1 Wt: 222.6 BMI: 40.68  Pt arrives having gained about 3 pounds. Pt states she has started a cholesterol medication since then has had whole body itching also stating she is having lower back pain.  Pt states she has been doing good lately with making better choices and smaller portions sizes. Pt states her biggest fear is having limited food choices. Pt states she is not a big meat eater. Pt states after surgery her biggest barrier is going to be eating out. Pt states her relationship with food has been getting better.  Pts chart is marked with diabetes due to a blood check in the hospital being 141.   MEDICATIONS: See list   DIETARY INTAKE:  24-hr recall:  B (9-10 AM): pure protein bar  Snk ( AM): sometimes nuts L (1-2 PM): turkey/cheese roll-ups, veggie straws, fruit cup or peanut butter Snk ( PM): protein shake D ( PM): tomato basil soup, rolls or hibachi shrimp, vegetables, noodles Snk ( PM): none Beverages: water, coffee (1 cup), hot tea, premier protein  Usual physical activity: none stated  Diet to Follow: 1600 calories 180 g carbohydrates 120 g protein 44 g fat  Preferred Learning Style:   No preference indicated   Learning Readiness:   Ready  Change in progress  Nutritional Diagnosis:  [place]-3.3 Overweight/obesity related to past poor dietary habits and physical inactivity as evidenced by patient w/ planned RYGB surgery following dietary guidelines for continued weight loss.    Intervention:  Nutrition counseling for upcoming Bariatric Surgery.  Goals:  - Aim to chew at least 30 times per bite or to applesauce consistency.  - Practice mindful eating. Using the needs and emotions sheet - Keep up the great work with what you're already doing!  Teaching Method Utilized:  Visual Auditory Hands on  Handouts given during visit include: - none  Barriers to  learning/adherence to lifestyle change: none  Demonstrated degree of understanding via:  Teach Back   Monitoring/Evaluation:  Dietary intake, exercise, and body weight in 1 month(s).

## 2017-09-13 DIAGNOSIS — R109 Unspecified abdominal pain: Secondary | ICD-10-CM | POA: Diagnosis not present

## 2017-09-14 ENCOUNTER — Ambulatory Visit: Payer: 59 | Admitting: Skilled Nursing Facility1

## 2017-09-18 DIAGNOSIS — L900 Lichen sclerosus et atrophicus: Secondary | ICD-10-CM | POA: Diagnosis not present

## 2017-09-18 DIAGNOSIS — L239 Allergic contact dermatitis, unspecified cause: Secondary | ICD-10-CM | POA: Diagnosis not present

## 2017-09-20 DIAGNOSIS — L239 Allergic contact dermatitis, unspecified cause: Secondary | ICD-10-CM | POA: Diagnosis not present

## 2017-09-22 DIAGNOSIS — L900 Lichen sclerosus et atrophicus: Secondary | ICD-10-CM | POA: Diagnosis not present

## 2017-09-22 DIAGNOSIS — S238XXA Sprain of other specified parts of thorax, initial encounter: Secondary | ICD-10-CM | POA: Diagnosis not present

## 2017-10-05 DIAGNOSIS — L298 Other pruritus: Secondary | ICD-10-CM | POA: Diagnosis not present

## 2017-10-05 DIAGNOSIS — L638 Other alopecia areata: Secondary | ICD-10-CM | POA: Diagnosis not present

## 2017-10-05 DIAGNOSIS — L900 Lichen sclerosus et atrophicus: Secondary | ICD-10-CM | POA: Diagnosis not present

## 2017-10-05 DIAGNOSIS — L2389 Allergic contact dermatitis due to other agents: Secondary | ICD-10-CM | POA: Diagnosis not present

## 2017-10-09 ENCOUNTER — Encounter: Payer: 59 | Attending: General Surgery | Admitting: Skilled Nursing Facility1

## 2017-10-09 DIAGNOSIS — Z713 Dietary counseling and surveillance: Secondary | ICD-10-CM | POA: Diagnosis not present

## 2017-10-09 DIAGNOSIS — E119 Type 2 diabetes mellitus without complications: Secondary | ICD-10-CM

## 2017-10-10 ENCOUNTER — Encounter: Payer: Self-pay | Admitting: Skilled Nursing Facility1

## 2017-10-10 NOTE — Progress Notes (Signed)
Pre-Operative Nutrition Class:  Appt start time: 2956   End time:  1830.  Patient was seen on 10/09/2017 for Pre-Operative Bariatric Surgery Education [facility]   Surgery date:  Surgery type: RYGB Start weight at [facility]: 225.1 Weight today: 222.2  Samples given per MNT protocol. Patient educated on appropriate usage: Bariatric Advantage Multivitamin Lot # [id number] Exp: 07/19  Bariatric Advantage Calcium  Lot # [id number] Exp: sep-12-2017  [place] Protein  Shake Lot # [id number] Exp: oct-19-19  The following the learning objectives were met by the patient during this course:  Identify Pre-Op Dietary Goals and will begin 2 weeks pre-operatively  Identify appropriate sources of fluids and proteins   State protein recommendations and appropriate sources pre and post-operatively  Identify Post-Operative Dietary Goals and will follow for 2 weeks post-operatively  Identify appropriate multivitamin and calcium sources  Describe the need for physical activity post-operatively and will follow MD recommendations  State when to call healthcare provider regarding medication questions or post-operative complications  Handouts given during class include:  Pre-Op Bariatric Surgery Diet Handout  Protein Shake Handout  Post-Op Bariatric Surgery Nutrition Handout  BELT Program Information Flyer  Support Group Information Flyer  [facility] Bariatric Supplements Price List  [facility]: [facility] MD.

## 2017-10-18 DIAGNOSIS — R7309 Other abnormal glucose: Secondary | ICD-10-CM | POA: Diagnosis not present

## 2017-10-18 DIAGNOSIS — I10 Essential (primary) hypertension: Secondary | ICD-10-CM | POA: Diagnosis not present

## 2017-10-18 DIAGNOSIS — R51 Headache: Secondary | ICD-10-CM | POA: Diagnosis not present

## 2017-10-18 DIAGNOSIS — Z0000 Encounter for general adult medical examination without abnormal findings: Secondary | ICD-10-CM | POA: Diagnosis not present

## 2017-10-18 DIAGNOSIS — Z13220 Encounter for screening for lipoid disorders: Secondary | ICD-10-CM | POA: Diagnosis not present

## 2017-10-30 ENCOUNTER — Encounter: Payer: Self-pay | Admitting: Physical Therapy

## 2017-10-30 ENCOUNTER — Ambulatory Visit: Payer: 59 | Attending: Dermatology | Admitting: Physical Therapy

## 2017-10-30 ENCOUNTER — Other Ambulatory Visit: Payer: Self-pay

## 2017-10-30 DIAGNOSIS — R252 Cramp and spasm: Secondary | ICD-10-CM

## 2017-10-30 DIAGNOSIS — M6281 Muscle weakness (generalized): Secondary | ICD-10-CM | POA: Diagnosis not present

## 2017-10-30 DIAGNOSIS — R279 Unspecified lack of coordination: Secondary | ICD-10-CM

## 2017-10-30 NOTE — Therapy (Addendum)
[facility] [address], [place], [place], [address] Phone: [phone]   Fax:  [phone]  Physical Therapy Evaluation  Patient Details  Name: Suzanne Gutierrez MRN: [id number] Date of Birth: 12-11-1972 Referring Doctor: Dr. Thomasene Lot.  [place]  Encounter Date: 10/30/2017  PT End of Session - 10/30/17 0943    Visit Number  1    Date for PT Re-Evaluation  01/22/18    Authorization Type  [employer]    Authorization - Visit Number  1    Authorization - Number of Visits  40    PT Start Time  0845    PT Stop Time  0930    PT Time Calculation (min)  45 min    Activity Tolerance  Patient tolerated treatment well    Behavior During Therapy  WFL for tasks assessed/performed       Past Medical History:  Diagnosis Date  . Bipolar disorder ([place])   . Diabetes mellitus without complication ([place])   . GERD (gastroesophageal reflux disease)   . Headache    Migraine  . Hypertension   . Lichen sclerosus of female genitalia   . Morbid obesity with BMI of 40.0-44.9, adult ([place])    Has been in the process of evaluation for GOP  . Osteoarthritis     Past Surgical History:  Procedure Laterality Date  . APPENDECTOMY  1994  . CARDIAC CATHETERIZATION  2016   [facility] Olin Hauser --was told she had no significant disease.  Marland Kitchen CESAREAN SECTION    . HERNIA REPAIR     X2  . NM MYOVIEW LTD  07/27/2017    LOW risk.  EF 67%.  No reversible perfusion noted.  There is a fixed defect in the lateral wall toward the apex likely breast attenuation.  . TRANSTHORACIC ECHOCARDIOGRAM  07/27/2017   No regional wall motion abnormality.  (Poor quality).  Mild RV dilation.  Mild LA dilation.  . TUBAL LIGATION    . WISDOM TOOTH EXTRACTION      There were no vitals filed for this visit.   Subjective Assessment - 10/30/17 0848    Subjective  Patient was diagnosed with [facility] 06/1250.  Patient went to a dermatologist and was treated for  6 months  then went to a specialist in [facility].  I am getting it under control.  The skin in the vulva area is so thin. Pain with wiping, intercourse. I have scarring over the clitoris, labia fused to labia majora.     Patient Stated Goals  comfortable sex; decreased pain with wiping, terrified of pain with vaginal exam    Currently in Pain?  Yes    Pain Score  7     Pain Location  Vagina    Pain Orientation  Mid    Pain Descriptors / Indicators  Burning itching, swollen    Pain Type  Acute pain    Pain Onset  More than a month ago    Pain Frequency  Intermittent    Aggravating Factors   sex wiping, vaginal exam, sitting, crossing legs with flare-up    Multiple Pain Sites  No         [facility] PT Assessment - 10/30/17 0001      Assessment   Medical Diagnosis  M25.0 Lichen Sclerosis    Referring Provider  Dr. Victory Dakin    Onset Date/Surgical Date  10/28/16    Prior Therapy  none      Precautions  Precautions  None      Restrictions   Weight Bearing Restrictions  No      Balance Screen   Has the patient fallen in the past 6 months  No    Has the patient had a decrease in activity level because of a fear of falling?   No    Is the patient reluctant to leave their home because of a fear of falling?   No      Home Film/video editor residence      Prior Function   Level of Independence  Independent    Vocation  Full time employment    Vocation Requirements  sitting    Leisure  none      Cognition   Overall Cognitive Status  Within Functional Limits for tasks assessed      Posture/Postural Control   Posture/Postural Control  Postural limitations    Postural Limitations  Rounded Shoulders;Forward head      ROM / Strength   AROM / PROM / Strength  AROM;PROM;Strength      AROM   Lumbar Extension  decreased by 25%    Lumbar - Left Side Bend  decreased by 25%      Strength   Right Hip Flexion  4/5    Right Hip External Rotation   4/5    Right Hip ABduction   3+/5    Right Hip ADduction  3+/5      Palpation   Palpation comment  tightness located in the right side of c-section scar, right upper abdoment; Tenderness located in bilateral piriformis, gluteals                Objective measurements completed on examination: See above findings.    Pelvic Floor Special Questions - 10/30/17 0001    Prior Pregnancies  Yes    Number of Pregnancies  2    Number of C-Sections  1    Number of Vaginal Deliveries  1    Any difficulty with labor and deliveries  Yes    Episiotomy Performed  Yes    Currently Sexually Active  Yes    Is this Painful  Yes    Marinoff Scale  pain interrupts completion    Urinary Leakage  Yes    Pad use  no    Activities that cause leaking  Coughing;Sneezing sitting    Urinary urgency  No    Urinary frequency  no    Fecal incontinence  No    External Perineal Exam  labial majora and minora are fused    Skin Integrity  Intact    Scar  Episiotomy;Restricted;Painful    Perineal Body/Introitus   Normal    External Palpation  posterior forchette was thin, tight; perineal body was tight and decreased mobility; tenderness located on the ishiocavernosus and bulbocavernosus, tightness around the clitoral hood    Prolapse  None    Pelvic Floor Internal Exam  Patient confirms identification and approved PT to assess pelvic floor and approve treatment    Exam Type  Vaginal    Palpation  thickness along the posterior introitus, pai nin bil. piriformis, thickness on the left ishiococcygeus    Strength  weak squeeze, no lift               PT Education - 10/30/17 0928    Education Details  Access Code: [email or web address]     Person(s) Educated  Patient    Methods  Explanation;Demonstration;Verbal  cues;Handout    Comprehension  Verbalized understanding;Returned demonstration       PT Short Term Goals - 10/30/17 0954      PT SHORT TERM GOAL #1   Title  educatioin on vaginal health to reduce irritatioin    Time  4     Period  Weeks    Status  New    [place] Date  11/27/17      PT SHORT TERM GOAL #2   Title  education on correct toileting to reduce strain on pelvic floor    Time  4    Period  Weeks    Status  New    Target Date  11/27/17      PT SHORT TERM GOAL #3   Title  education on perineal massage to reduce restrictions to the episiotomy scar and posterior forchette    Time  4    Period  Weeks    Status  New    Target Date  11/27/17      PT SHORT TERM GOAL #4   Title  education on vaginal moisturizers and lubricants to promote improve vaginal health    Time  4    Period  Weeks    Status  New    Target Date  11/27/17        PT Long Term Goals - 10/30/17 0957      PT LONG TERM GOAL #1   Title  independent wIth HEP and understand how to progress herself    Time  12    Period  Weeks    Status  New    Target Date  01/22/18      PT LONG TERM GOAL #2   Title  Marinoff score </= 1/3 due to improved tissue mobility and reduction of trigger points    Time  12    Target Date  01/22/18      PT LONG TERM GOAL #3   Title  Pelvic floor strength >/= 4/5 so her urinary leakage is minimal to none and has a circular contraction with lift    Time  12    Period  Weeks    Status  New    Target Date  01/22/18      PT LONG TERM GOAL #4   Title  pain with sitting and crossing her legs due to reduction of [place] Sclerosis symptoms due to improve vaginal health    Time  12    Period  Weeks    Status  New    Target Date  01/22/18             Plan - 10/30/17 0944    Clinical Impression Statement  Patient is a 52 year old female with diagnosis of Lichen Sclerosus since 10/28/2016.  Patient pain level is 7/10 with intercourse, sitting, wiping, vaginal exam, and crossing her legs.  Patient has tenderness located in right upper abdominal, along the right side of c-section scar, bilateral piriformis, and gluteals.  Bilateral hip strength is 4/5.  Pelvis in correct alignment.  Pelvic floor strength  is 2/5 with decreased contraction posteriorly. Urinary leakage with laughing, coughing, sneezing and sitting.  Labia minora is fused to labia majora and tightness around the clitorus. Posterior forchette it thin  and tight.  Episiotomy scar is tight with decreased mobility.  Internally tenderness located on bilateral piriformis and ischiococcygeus.  Marinoff score is 2/3 due to her having to stop in the middle due to pain. Patient will  be having gastric bypass on 12/24/2017.  Patient will benefit from skilled therapy to reduce pain, improve tissue mobility, improve strength and education on vaginal health.     History and Personal Factors relevant to plan of care:  C-section scar; appendectomy, diabetes, Hernia repair 2x, Hypertension, Bipolor; will have gastric bypass surgery 12/24/2017    Clinical Presentation  Evolving    Clinical Presentation due to:  unable to have penile penetration, scarred of vaginal exam    Clinical Decision Making  Moderate    Rehab Potential  Excellent    Clinical Impairments Affecting Rehab Potential  C-section scar; appendectomy, diabetes, Hernia repair 2x, Hypertension, Bipolor; will have gastric bypass surgery 12/24/2017    PT Frequency  2x / week    PT Duration  12 weeks    PT Treatment/Interventions  Biofeedback;Cryotherapy;Electrical Stimulation;Moist Heat;Ultrasound;Therapeutic exercise;Therapeutic activities;Neuromuscular re-education;Patient/family education;Scar mobilization;Manual techniques;Passive range of motion;Dry needling    PT Next Visit Plan  abdominal massage; toileting, soft tissue work; education on perineal massage    PT Home Exercise Plan  toileting; abdominal massage    Consulted and Agree with Plan of Care  Patient       Patient will benefit from skilled therapeutic intervention in order to improve the following deficits and impairments:  Decreased skin integrity, Increased fascial restricitons, Pain, Decreased mobility, Decreased scar mobility,  Increased muscle spasms, Decreased range of motion, Decreased activity tolerance, Decreased strength  Visit Diagnosis: Muscle weakness (generalized) - Plan: PT plan of care cert/re-cert  Cramp and spasm - Plan: PT plan of care cert/re-cert  Unspecified lack of coordination - Plan: PT plan of care cert/re-cert     Problem List Patient Active Problem List   Diagnosis Date Noted  . Costochondritis 08/08/2017  . Diabetes mellitus without complication ([place]) 03/50/0938  . Essential hypertension 07/24/2017  . Left-sided chest wall pain 07/21/2017  . DOE (dyspnea on exertion) 07/21/2017  . Chest pain 07/21/2017  . Abnormal finding on EKG 07/21/2017    Earlie Counts, PT 10/30/17 10:04 AM   [facility] [address] [place], [place], [address] Phone: [phone]   Fax:  [phone]  Name: LAQUITTA DOMINSKI MRN: [id number] Date of Birth: [birth year]

## 2017-10-30 NOTE — Patient Instructions (Addendum)
Moisturizers . They are used in the vagina to hydrate the mucous membrane that make up the vaginal canal. . Designed to keep a more normal acid balance (ph) . Once placed in the vagina, it will last between two to three days.  . Use 2-3 times per week at bedtime and last longer than 60 min. . Ingredients to avoid is glycerin and fragrance, can increase chance of infection . Should not be used just before sex due to causing irritation . Most are gels administered either in a tampon-shaped applicator or as a vaginal suppository. They are non-hormonal.   Types of Moisturizers . Samul Dada- drug store . Vitamin E vaginal suppositories- Whole foods, Amazon . Moist Again . Coconut oil- can break down condoms . Michail Jewels . Yes moisturizer- amazon . NeuEve Silk , NeuEve Silver for menopausal or over 65 (if have severe vaginal atrophy or cancer treatments use NeuEve Silk for  1 month than move to The Pepsi)- [employer], MapleFlower.dk . Olive and Bee intimate cream- www.oliveandbee.com.au  Creams to use externally on the Vulva area  Albertson's (good for for cancer patients that had radiation to the area)- [place] or [employer] Corporation.FlyingBasics.com.br  V-magic cream - amazon  Julva-amazon  Vital "V [place] salve ( help moisturize and help with thinning vulvar area, does have [place]   Things to avoid in the vaginal area . Do not use things to irritate the vulvar area . No lotions just specialized creams for the vulva area- Neogyn, V-magic, No soaps; can use Aveeno or Calendula cleanser if needed. Must be gentle . No deodorants . No douches . Good to sleep without underwear to let the vaginal area to air out . No scrubbing: spread the lips to let warm water rinse over labias and pat dry  Lubrication . Used for intercourse to reduce friction . Avoid ones that have glycerin, warming gels, tingling gels, icing or  cooling gel, scented . Avoid parabens due to a preservative similar to female sex hormone . May need to be reapplied once or several times during sexual activity . Can be applied to both partners genitals prior to vaginal penetration to minimize friction or irritation . Prevent irritation and mucosal tears that cause post coital pain and increased the risk of vaginal and urinary tract infections . Oil-based lubricants cannot be used with condoms due to breaking them down.  Least likely to irritate vaginal tissue.  . Plant based-lubes are safe . Silicone-based lubrication are thicker and last long and used for post-menopausal women  Vaginal Lubricators Here is a list of some suggested lubricators you can use for intercourse. Use the most hypoallergenic product.  You can place on you or your partner.   Slippery Stuff  Sylk or Sliquid Natural H2O ( good  if frequent UTI's)  Blossom Organics (www.blossom-organics.com)  Luvena   Coconut oil  PJur Woman Nude- water based lubricant, amazon  Uberlube- Amazon  Aloe Vera  Yes lubricant- [employer] Platinum-Silicone, Target, Walgreens  Olive and Bee intimate cream-  www.oliveandbee.com.au Things to avoid in lubricants are glycerin, warming gels, tingling gels, icing or cooling  gels, and scented gels.  Also avoid Vaseline. KY jelly, Replens, and Astroglide kills good bacteria(lactobacilli)  Things to avoid in the vaginal area . Do not use things to irritate the vulvar area . No lotions- see below . No soaps; can use Aveeno or Calendula cleanser if needed. Must be  gentle . No deodorants . No douches . Good to sleep without underwear to let the vaginal area to air out . No scrubbing: spread the lips to let warm water rinse over labias and pat dry  Creams that can be used on the [place] Releveum or [place]  Conseco    Irritants/Allergens ( on exposure, can cause immediate stinging or burning)  Body fluids: urine, feces, vaginal discharge, sweat, semen  Feminine Hygiene Products: douches, yogurt douche, feminine wipes, baby wipes, sanitary pads, panty liners, tampons, deodorants (including deodorants in tampons/pads), lotions, powders (including talcum powder), perfumes, shampoos,soaps  Sexual support: lubricants, condoms, diaphragms, spermicides, arousal stimulants  Laundry detergent, bleach, fabric softener  Cleansing products: soap, bubble baths and salts, shampoo, conditioner, perfumed toielt paper  Physical irritants: tight fitting clothes, Nylon underwear, synthetic undergarments/pantyhose, chemically treated clothing, latex, wash cloths, sponges, hot water, excessive washing, vigorous drying with towel/hair dryer  HPV medication, tea tree oil, [place]  Alcohol and astringents  Topical medicaments: Benzocaine, Neomycin, Chlorhexidine (in K-Y Jelly), Imidazole antifungal, Propylene glycol ( Preservative used in many products:, tea tree oil, preservatives and bases medications are placed in.   Adapted from the V Book by Noland Fordyce. Nicole Kindred, MD., and Lynnell Dike [facility] Books, 2002), and Proceedings in Obstetrics and Gynecology, 2014; 4(2):1    Gentle Vulvar care- Adapted from the National Vulvodynia Association and the V Book  Wear loose clothing.  Wear all-cotton underwear, and go without when at home. Avoid wearing pantyhose, wear thigh high or knee high pantyhose. If you do not use underwear with yoga pants, try using an all-cotton against the vulva. Tight clothing can be irritating to the vulva by increasing friction and exacerbate irritation and redness.  Wear loose fitting clothes when possible. When swimming, remove wet bathing suits and shower to remove cholerine from the area.Marland Kitchen  Avoid hot tubs and heavily chlorinated pools. Shower after exercise to eliminate excess  consider avoiding exercise that causes friction or pressure to the area such as horseback riding, bicycle riding, and spinning classes.   To cleanse the area, use your fingers instead of a washcloth and plain water.  If you feel you must use a cleanser, unscented, non-alkaline cleanser such as Cetaphil, or mild soaps made for sensitive skin such as Dove or Neurtrogena. Be sure any cleaners are unscented. Avoid rubbing the vulva. Soak for five minutes in lukewarm water to remove any residue of sweat or lotions or other products.  Pat dry, and apply any prescribed medication. Avoid products with multiple ingredients.  Even those that sound designed for vulvar care, like A& D Original Ointment, baby lotion, or Vagisil, contain chemicals that could irritate or cause contact dermatitis. Avoid using bubble bath, feminine hygiene sprays, or any type of perfumed creams or sprays near the vulva.  In the bathroom, forgo moistened wipes. If you want moisture, use a spray bottle with plain water, and then pat dry. Use soft, white, unscented toilet paper. Avoid repetitive back and forth motion or rubbing. Drink plenty of water and fluids to dilute urine. Concentrated urine can be irritating to the vulvar skin.   When doing laundry, use hypoallergenic laundry detergent that is very mild, such as those made for babies. Avoid using fabric softeners with undergarments and double rinse undergarments.  For sexual intercourse, be sure to use a lubricant that is without additives or  preservatives.  Additives like bactericides, spermicides, warming agents, and flavors can cause vulvar irritation. Rinse the vulva after intercourse. A frozen gel pack wrapped in a towel can be used for irritation after exercise or intercourse.   [facility] [address] [place], [place] [address] Phone # [phone] Fax [phone] Access Code: [email or web address]  URL: [email or web address]  Date: 10/30/2017   Prepared by: Earlie Counts   Exercises  Supine Piriformis Stretch Pulling Heel to Hip - 2 reps - 1 sets - 30 hold - 1x daily - 7x weekly  Supine Piriformis Stretch - 2 reps - 1 sets - 30 hold - 1x daily - 7x weekly  Seated Piriformis Stretch - 2 reps - 1 sets - 30 hold - 1x daily - 7x weekly  Seated Hamstring Stretch - 2 reps - 1 sets - 30 hold - 1x daily - 7x weekly

## 2017-11-14 ENCOUNTER — Encounter: Payer: Self-pay | Admitting: Physical Therapy

## 2017-11-14 ENCOUNTER — Ambulatory Visit: Payer: 59 | Admitting: Physical Therapy

## 2017-11-14 DIAGNOSIS — M6281 Muscle weakness (generalized): Secondary | ICD-10-CM | POA: Diagnosis not present

## 2017-11-14 DIAGNOSIS — R279 Unspecified lack of coordination: Secondary | ICD-10-CM

## 2017-11-14 DIAGNOSIS — R252 Cramp and spasm: Secondary | ICD-10-CM

## 2017-11-14 NOTE — Therapy (Addendum)
[facility] [address] [place], [place], [address] Phone: [phone]   Fax:  [phone]  Physical Therapy Treatment  Patient Details  Name: Suzanne Gutierrez MRN: [id number] Date of Birth: [birth year] Referring MD: Dr. Glendora Score  Encounter Date: 11/14/2017  PT End of Session - 11/14/17 0928    Visit Number  2    Date for PT Re-Evaluation  01/22/18    Authorization Type  [employer]    Authorization - Visit Number  2    Authorization - Number of Visits  40    PT Start Time  0845    PT Stop Time  0925    PT Time Calculation (min)  40 min    Activity Tolerance  Patient tolerated treatment well    Behavior During Therapy  [facility] for tasks assessed/performed       Past Medical History:  Diagnosis Date  . Bipolar disorder ([place])   . Diabetes mellitus without complication ([place])   . GERD (gastroesophageal reflux disease)   . Headache    Migraine  . Hypertension   . Lichen sclerosus of female genitalia   . Morbid obesity with BMI of 40.0-44.9, adult ([place])    Has been in the process of evaluation for GOP  . Osteoarthritis     Past Surgical History:  Procedure Laterality Date  . APPENDECTOMY  1994  . CARDIAC CATHETERIZATION  2016   [facility] Olin Hauser --was told she had no significant disease.  Marland Kitchen CESAREAN SECTION    . HERNIA REPAIR     X2  . NM MYOVIEW LTD  07/27/2017    LOW risk.  EF 67%.  No reversible perfusion noted.  There is a fixed defect in the lateral wall toward the apex likely breast attenuation.  . TRANSTHORACIC ECHOCARDIOGRAM  07/27/2017   No regional wall motion abnormality.  (Poor quality).  Mild RV dilation.  Mild LA dilation.  . TUBAL LIGATION    . WISDOM TOOTH EXTRACTION      There were no vitals filed for this visit.  Subjective Assessment - 11/14/17 0848    Subjective  I felt good after the evaluation.  I use ice in the area when it feels swollen.    Patient Stated Goals  comfortable  sex; decreased pain with wiping, terrified of pain with vaginal exam    Currently in Pain?  Yes    Pain Score  7     Pain Location  Vagina    Pain Orientation  Mid    Pain Descriptors / Indicators  Burning itching, swollen    Pain Type  Acute pain    Pain Onset  More than a month ago    Pain Frequency  Intermittent    Aggravating Factors   sex, wiping, vaginal exam, sitting, crossing legs with flare-up    Pain Relieving Factors  ice    Multiple Pain Sites  No                       [facility] Adult PT Treatment/Exercise - 11/14/17 0001      Manual Therapy   Manual Therapy  Soft tissue mobilization;Myofascial release    Manual therapy comments  scar massage where c-section was    Soft tissue mobilization  diaphragm, transverse abdominus, obliques, insertion of abdominals to the pubic bone    Myofascial Release  throughout the abdomen to go through all 3 planes of fascia  PT Education - 11/14/17 [phone]    Education Details  educated on toileting, scar massage, abdominal massage    Person(s) Educated  Patient    Methods  Explanation;Demonstration;Verbal cues;Handout    Comprehension  Returned demonstration;Verbalized understanding       PT Short Term Goals - 11/14/17 0930      PT SHORT TERM GOAL #2   Title  education on correct toileting to reduce strain on pelvic floor    Time  4    Period  Weeks    Status  Achieved        PT Long Term Goals - 10/30/17 0957      PT LONG TERM GOAL #1   Title  independent wIth HEP and understand how to progress herself    Time  12    Period  Weeks    Status  New    Target Date  01/22/18      PT LONG TERM GOAL #2   Title  Marinoff score </= 1/3 due to improved tissue mobility and reduction of trigger points    Time  12    Target Date  01/22/18      PT LONG TERM GOAL #3   Title  Pelvic floor strength >/= 4/5 so her urinary leakage is minimal to none and has a circular contraction with lift    Time  12     Period  Weeks    Status  New    Target Date  01/22/18      PT LONG TERM GOAL #4   Title  pain with sitting and crossing her legs due to reduction of Lychens Sclerosis symptoms due to improve vaginal health    Time  12    Period  Weeks    Status  New    Target Date  01/22/18            Plan - 11/14/17 7035    Clinical Impression Statement  Patient had increased softness of the c-section scar.  Patient had gurgling noise in the abdomen after abdominal massage.  Patient understands how to toilet correctly.  Patient will benefit from skilled therapy to reduce pain, improve tissue mobility, imrove strength and education on vaginal health.      Rehab Potential  Excellent    Clinical Impairments Affecting Rehab Potential  C-section scar; appendectomy, diabetes, Hernia repair 2x, Hypertension, Bipolor; will have gastric bypass surgery 12/24/2017    PT Frequency  2x / week    PT Duration  12 weeks    PT Treatment/Interventions  Biofeedback;Cryotherapy;Electrical Stimulation;Moist Heat;Ultrasound;Therapeutic exercise;Therapeutic activities;Neuromuscular re-education;Patient/family education;Scar mobilization;Manual techniques;Passive range of motion;Dry needling    PT Next Visit Plan  internal soft tissue work, bulging of pelvic floor; abdominal bracing    PT Home Exercise Plan  vaginal moisturizers and lubricants    Consulted and Agree with Plan of Care  Patient       Patient will benefit from skilled therapeutic intervention in order to improve the following deficits and impairments:  Decreased skin integrity, Increased fascial restricitons, Pain, Decreased mobility, Decreased scar mobility, Increased muscle spasms, Decreased range of motion, Decreased activity tolerance, Decreased strength  Visit Diagnosis: Muscle weakness (generalized)  Cramp and spasm  Unspecified lack of coordination     Problem List Patient Active Problem List   Diagnosis Date Noted  . Costochondritis  08/08/2017  . Diabetes mellitus without complication ([place]) 00/93/8182  . Essential hypertension 07/24/2017  . Left-sided chest wall pain 07/21/2017  . DOE (  dyspnea on exertion) 07/21/2017  . Chest pain 07/21/2017  . Abnormal finding on EKG 07/21/2017    Earlie Counts, PT 11/14/17 9:32 AM   [facility] [address] [place], [place], [address] Phone: [phone]   Fax:  [phone]  Name: Suzanne Gutierrez MRN: [id number] Date of Birth: [birth year]

## 2017-11-14 NOTE — Patient Instructions (Addendum)
Toileting Techniques for Bowel Movements (Defecation) Using your belly (abdomen) and pelvic floor muscles to have a bowel movement is usually instinctive.  Sometimes people can have problems with these muscles and have to relearn proper defecation (emptying) techniques.  If you have weakness in your muscles, organs that are falling out, decreased sensation in your pelvis, or ignore your urge to go, you may find yourself straining to have a bowel movement.  You are straining if you are: . holding your breath or taking in a huge gulp of air and holding it  . keeping your lips and jaw tensed and closed tightly . turning red in the face because of excessive pushing or forcing . developing or worsening your  hemorrhoids . getting faint while pushing . not emptying completely and have to defecate many times a day  If you are straining, you are actually making it harder for yourself to have a bowel movement.  Many people find they are pulling up with the pelvic floor muscles and closing off instead of opening the anus. Due to lack pelvic floor relaxation and coordination the abdominal muscles, one has to work harder to push the feces out.  Many people have never been taught how to defecate efficiently and effectively.  Notice what happens to your body when you are having a bowel movement.  While you are sitting on the toilet pay attention to the following areas: . Jaw and mouth position . Angle of your hips   . Whether your feet touch the ground or not . Arm placement  . Spine position . Waist . Belly tension . Anus (opening of the anal canal)  An Evacuation/Defecation Plan   Here are the 4 basic points:  1. Lean forward enough for your elbows to rest on your knees 2. Support your feet on the floor or use a low stool if your feet don't touch the floor  3. Push out your belly as if you have swallowed a beach ball-you should feel a widening of your waist 4. Open and relax your pelvic floor muscles,  rather than tightening around the anus      The following conditions my require modifications to your toileting posture:  . If you have had surgery in the past that limits your back, hip, pelvic, knee or ankle flexibility . Constipation   Your healthcare practitioner may make the following additional suggestions and adjustments:  1) Sit on the toilet  a) Make sure your feet are supported. b) Notice your hip angle and spine position-most people find it effective to lean forward or raise their knees, which can help the muscles around the anus to relax  c) When you lean forward, place your forearms on your thighs for support  2) Relax suggestions a) Breath deeply in through your nose and out slowly through your mouth as if you are smelling the flowers and blowing out the candles. b) To become aware of how to relax your muscles, contracting and releasing muscles can be helpful.  Pull your pelvic floor muscles in tightly by using the image of holding back gas, or closing around the anus (visualize making a circle smaller) and lifting the anus up and in.  Then release the muscles and your anus should drop down and feel open. Repeat 5 times ending with the feeling of relaxation. c) Keep your pelvic floor muscles relaxed; let your belly bulge out. d) The digestive tract starts at the mouth and ends at the anal opening, so be   be sure to relax both ends of the tube.  Place your tongue on the roof of your mouth with your teeth separated.  This helps relax your mouth and will help to relax the anus at the same time.  3) Empty (defecation) a) Keep your pelvic floor and sphincter relaxed, then bulge your anal muscles.  Make the anal opening wide.  b) Stick your belly out as if you have swallowed a beach ball. c) Make your belly wall hard using your belly muscles while continuing to breathe. Doing this makes it easier to open your anus. d) Breath out and give a grunt (or try using other sounds  such as ahhhh, shhhhh, ohhhh or grrrrrrr).  4) Finish a) As you finish your bowel movement, pull the pelvic floor muscles up and in.  This will leave your anus in the proper place rather than remaining pushed out and down. If you leave your anus pushed out and down, it will start to feel as though that is normal and give you incorrect signals about needing to have a bowel movement.  About Abdominal Massage  Abdominal massage, also called external colon massage, is a self-treatment circular massage technique that can reduce and eliminate gas and ease constipation. The colon naturally contracts in waves in a clockwise direction starting from inside the right hip, moving up toward the ribs, across the belly, and down inside the left hip.  When you perform circular abdominal massage, you help stimulate your colon's normal wave pattern of movement called peristalsis.  It is most beneficial when done after eating.  Positioning You can practice abdominal massage with oil while lying down, or in the shower with soap.  Some people find that it is just as effective to do the massage through clothing while sitting or standing.  How to Massage Start by placing your finger tips or knuckles on your right side, just inside your hip bone.  . Make small circular movements while you move upward toward your rib cage.   . Once you reach the bottom right side of your rib cage, take your circular movements across to the left side of the bottom of your rib cage.  . Next, move downward until you reach the inside of your left hip bone.  This is the path your feces travel in your colon. . Continue to perform your abdominal massage in this pattern for 10 minutes each day.     You can apply as much pressure as is comfortable in your massage.  Start gently and build pressure as you continue to practice.  Notice any areas of pain as you massage; areas of slight pain may be relieved as you massage, but if you have areas of  significant or intense pain, consult with your healthcare provider.  Other Considerations . General physical activity including bending and stretching can have a beneficial massage-like effect on the colon.  Deep breathing can also stimulate the colon because breathing deeply activates the same nervous system that supplies the colon.   . Abdominal massage should always be used in combination with a bowel-conscious diet that is high in the proper type of fiber for you, fluids (primarily water), and a regular exercise program. [facility] [address], [address] [place], [place] [address] Phone # [phone] Fax [phone]  

## 2017-11-15 NOTE — Addendum Note (Signed)
Addended by: Earlie Counts F on: 11/15/2017 08:44 AM   Modules accepted: Orders

## 2017-11-21 ENCOUNTER — Ambulatory Visit: Payer: 59 | Admitting: Physical Therapy

## 2017-11-21 ENCOUNTER — Encounter: Payer: Self-pay | Admitting: Physical Therapy

## 2017-11-21 DIAGNOSIS — M6281 Muscle weakness (generalized): Secondary | ICD-10-CM

## 2017-11-21 DIAGNOSIS — R252 Cramp and spasm: Secondary | ICD-10-CM

## 2017-11-21 DIAGNOSIS — R279 Unspecified lack of coordination: Secondary | ICD-10-CM

## 2017-11-21 NOTE — Therapy (Signed)
[facility] [address], [place], [place], [address] Phone: [phone]   Fax:  [phone]  Physical Therapy Treatment  Patient Details  Name: Suzanne Gutierrez MRN: [id number] Date of Birth: [birth year] Referring Provider: Dr. Thomasene Lot Ciriacks   Encounter Date: 11/21/2017  PT End of Session - 11/21/17 0928    Visit Number  3    Date for PT Re-Evaluation  01/22/18    Authorization - Visit Number  3    Authorization - Number of Visits  40    PT Start Time  0845    PT Stop Time  0925    PT Time Calculation (min)  40 min    Activity Tolerance  Patient tolerated treatment well    Behavior During Therapy  [facility] for tasks assessed/performed       Past Medical History:  Diagnosis Date  . Bipolar disorder ([place])   . Diabetes mellitus without complication ([place])   . GERD (gastroesophageal reflux disease)   . Headache    Migraine  . Hypertension   . Lichen sclerosus of female genitalia   . Morbid obesity with BMI of 40.0-44.9, adult ([place])    Has been in the process of evaluation for GOP  . Osteoarthritis     Past Surgical History:  Procedure Laterality Date  . APPENDECTOMY  1994  . CARDIAC CATHETERIZATION  2016   [facility] Olin Hauser --was told she had no significant disease.  Marland Kitchen CESAREAN SECTION    . HERNIA REPAIR     X2  . NM MYOVIEW LTD  07/27/2017    LOW risk.  EF 67%.  No reversible perfusion noted.  There is a fixed defect in the lateral wall toward the apex likely breast attenuation.  . TRANSTHORACIC ECHOCARDIOGRAM  07/27/2017   No regional wall motion abnormality.  (Poor quality).  Mild RV dilation.  Mild LA dilation.  . TUBAL LIGATION    . WISDOM TOOTH EXTRACTION      There were no vitals filed for this visit.  Subjective Assessment - 11/21/17 0850    Subjective  Pain with wiping has no change.     Patient Stated Goals  comfortable sex; decreased pain with wiping, terrified of pain with vaginal  exam    Currently in Pain?  Yes    Pain Score  2     Pain Location  Vagina    Pain Orientation  Mid    Pain Descriptors / Indicators  Burning itching, swollen    Pain Type  Acute pain    Pain Onset  More than a month ago    Pain Frequency  Intermittent    Aggravating Factors   sex, wiping, vaginal exam, sitting, crossing legs with flare-up    Pain Relieving Factors  ice    Multiple Pain Sites  No                       [facility] Adult PT Treatment/Exercise - 11/21/17 0001      Lumbar Exercises: Supine   Ab Set  10 reps;5 seconds    AB Set Limitations  need tactile cues    Other Supine Lumbar Exercises  dipahgramatic breathing with bulging of the pelvic floor      Manual Therapy   Manual Therapy  Myofascial release;Soft tissue mobilization    Manual therapy comments  using a assitive device to relax the lumbar paraspinals    Soft tissue mobilization  lumbar  paraspinals, left iliopsoas, transverse abdominus, diaphgram    Myofascial Release  along the abdomen, lower abdomen, lateral abdominals in quadruped with pulling forward             PT Education - 11/21/17 0900    Education Details  Access Code: [email or web address]     Person(s) Educated  Patient    Methods  Explanation;Demonstration;Verbal cues;Handout    Comprehension  Verbalized understanding;Returned demonstration       PT Short Term Goals - 11/14/17 0930      PT SHORT TERM GOAL #2   Title  education on correct toileting to reduce strain on pelvic floor    Time  4    Period  Weeks    Status  Achieved        PT Long Term Goals - 10/30/17 0957      PT LONG TERM GOAL #1   Title  independent wIth HEP and understand how to progress herself    Time  12    Period  Weeks    Status  New    Target Date  01/22/18      PT LONG TERM GOAL #2   Title  Marinoff score </= 1/3 due to improved tissue mobility and reduction of trigger points    Time  12    Target Date  01/22/18      PT LONG TERM GOAL #3   Title   Pelvic floor strength >/= 4/5 so her urinary leakage is minimal to none and has a circular contraction with lift    Time  12    Period  Weeks    Status  New    Target Date  01/22/18      PT LONG TERM GOAL #4   Title  pain with sitting and crossing her legs due to reduction of Lychens Sclerosis symptoms due to improve vaginal health    Time  12    Period  Weeks    Status  New    Target Date  01/22/18            Plan - 11/21/17 0851    Clinical Impression Statement  After manual work patient had no pelvic floor pain.  Patient had increased tightenss in the abdominals and lumbar that it made it difficult to fully expand her abdomen and bulge the pelvic floor.  Patient has not reduce urinary leakage or pain goals. Patient will benefit from skilled therapy to reduce pain, improve tissue mobility, improve strength and education on vaginal health.     Rehab Potential  Excellent    Clinical Impairments Affecting Rehab Potential  C-section scar; appendectomy, diabetes, Hernia repair 2x, Hypertension, Bipolor; will have gastric bypass surgery 12/24/2017    PT Frequency  2x / week    PT Duration  12 weeks    PT Treatment/Interventions  Biofeedback;Cryotherapy;Electrical Stimulation;Moist Heat;Ultrasound;Therapeutic exercise;Therapeutic activities;Neuromuscular re-education;Patient/family education;Scar mobilization;Manual techniques;Passive range of motion;Dry needling    PT Next Visit Plan  internal soft tissue work, bulging of pelvic floor; abdominal bracing; education of vaginal health    PT Home Exercise Plan  Access Code: [email or web address]     Consulted and Agree with Plan of Care  Patient       Patient will benefit from skilled therapeutic intervention in order to improve the following deficits and impairments:  Decreased skin integrity, Increased fascial restricitons, Pain, Decreased mobility, Decreased scar mobility, Increased muscle spasms, Decreased range of motion, Decreased activity  tolerance, Decreased strength  Visit Diagnosis: Muscle  weakness (generalized)  Cramp and spasm  Unspecified lack of coordination     Problem List Patient Active Problem List   Diagnosis Date Noted  . Costochondritis 08/08/2017  . Diabetes mellitus without complication ([place]) 30/86/5784  . Essential hypertension 07/24/2017  . Left-sided chest wall pain 07/21/2017  . DOE (dyspnea on exertion) 07/21/2017  . Chest pain 07/21/2017  . Abnormal finding on EKG 07/21/2017    Earlie Counts, PT 11/21/17 9:31 AM   [facility] [address], [place], [place], [address] Phone: [phone]   Fax:  [phone]  Name: Suzanne Gutierrez MRN: [id number] Date of Birth: [birth year]

## 2017-11-21 NOTE — Patient Instructions (Addendum)
Access Code: [email or web address]  URL: [email or web address]  Date: 11/21/2017  Prepared by: Earlie Counts   Exercises  Supine Piriformis Stretch Pulling Heel to Hip - 2 reps - 1 sets - 30 hold - 1x daily - 7x weekly  Supine Piriformis Stretch - 2 reps - 1 sets - 30 hold - 1x daily - 7x weekly  Seated Piriformis Stretch - 2 reps - 1 sets - 30 hold - 1x daily - 7x weekly  Seated Hamstring Stretch - 2 reps - 1 sets - 30 hold - 1x daily - 7x weekly  Child's Pose Stretch - 2 reps - 1 sets - 30 hold - 1x daily - 7x weekly  Child's Pose with Sidebending - 2 reps - 1 sets - 30 hold - 1x daily - 7x weekly  Prone Press Up - 5 reps - 1 sets - 5 hold - 1x daily - 7x weekly  Beginner Bridge - 10 reps - 1 sets - 5 hold - 1x daily - 7x weekly  Cat Cow - 10 reps - 1 sets - 1x daily - 7x weekly  Supine Diaphragmatic Breathing - 10 reps - 1 sets - 5 hold - 1x daily - 7x weekly  [facility] [address] [place], [place] [address] Phone # [phone] Fax [phone]

## 2017-11-28 ENCOUNTER — Ambulatory Visit: Payer: 59 | Attending: Dermatology | Admitting: Physical Therapy

## 2017-11-28 ENCOUNTER — Encounter: Payer: Self-pay | Admitting: Physical Therapy

## 2017-11-28 DIAGNOSIS — R279 Unspecified lack of coordination: Secondary | ICD-10-CM | POA: Diagnosis not present

## 2017-11-28 DIAGNOSIS — M6281 Muscle weakness (generalized): Secondary | ICD-10-CM

## 2017-11-28 DIAGNOSIS — R252 Cramp and spasm: Secondary | ICD-10-CM | POA: Diagnosis not present

## 2017-11-28 NOTE — Therapy (Signed)
[facility] [address], [place], [place], [address] Phone: [phone]   Fax:  [phone]  Physical Therapy Treatment  Patient Details  Name: Suzanne Gutierrez MRN: [id number] Date of Birth: [birth year] Referring Provider: Dr. Thomasene Lot Ciriacks   Encounter Date: 11/28/2017  PT End of Session - 11/28/17 1014    Visit Number  4    Date for PT Re-Evaluation  01/22/18    Authorization Type  UHC    Authorization - Visit Number  4    Authorization - Number of Visits  40    PT Start Time  0845    PT Stop Time  0930    PT Time Calculation (min)  45 min    Activity Tolerance  Patient tolerated treatment well    Behavior During Therapy  [facility] for tasks assessed/performed       Past Medical History:  Diagnosis Date  . Bipolar disorder ([place])   . Diabetes mellitus without complication ([place])   . GERD (gastroesophageal reflux disease)   . Headache    Migraine  . Hypertension   . Lichen sclerosus of female genitalia   . Morbid obesity with BMI of 40.0-44.9, adult ([place])    Has been in the process of evaluation for GOP  . Osteoarthritis     Past Surgical History:  Procedure Laterality Date  . APPENDECTOMY  1994  . CARDIAC CATHETERIZATION  2016   [facility] Olin Hauser --was told she had no significant disease.  Marland Kitchen CESAREAN SECTION    . HERNIA REPAIR     X2  . NM MYOVIEW LTD  07/27/2017    LOW risk.  EF 67%.  No reversible perfusion noted.  There is a fixed defect in the lateral wall toward the apex likely breast attenuation.  . TRANSTHORACIC ECHOCARDIOGRAM  07/27/2017   No regional wall motion abnormality.  (Poor quality).  Mild RV dilation.  Mild LA dilation.  . TUBAL LIGATION    . WISDOM TOOTH EXTRACTION      There were no vitals filed for this visit.  Subjective Assessment - 11/28/17 0854    Subjective  I am a litting better.  No change with wiping herself. I saw the dermatologist. I am to do 5 days a week  with clvatosol.     Patient Stated Goals  comfortable sex; decreased pain with wiping, terrified of pain with vaginal exam    Currently in Pain?  Yes    Pain Score  2     Pain Location  Vagina    Pain Orientation  Mid    Pain Descriptors / Indicators  Burning itching swollen    Pain Type  Acute pain    Pain Onset  More than a month ago    Pain Frequency  Intermittent    Aggravating Factors   sex, wiping, vaginal exam, sitting, crossing legs with flare-up    Pain Relieving Factors  ice    Multiple Pain Sites  No                    Pelvic Floor Special Questions - 11/28/17 0001    Pelvic Floor Internal Exam  Patient confirms identification and approved PT to assess pelvic floor and approve treatment    Exam Type  Vaginal    Strength  weak squeeze, no lift        [facility] Adult PT Treatment/Exercise - 11/28/17 0001      Manual Therapy  Manual Therapy  Myofascial release;Internal Pelvic Floor    Manual therapy comments  instruction on how to perfrom soft tissue work around the clitoris to reduce scarring from [facility]    Myofascial Release  along the clitoris, bil. bulbocavernosus, ischiocavernosus    Internal Pelvic Floor  along the sphincter muscle, urethra sphincter, obturator internist             PT Education - 11/28/17 1013    Education Details  information on vaginal health, information on vaginal moisturizers and lubricants;     Person(s) Educated  Patient    Methods  Explanation;Handout    Comprehension  Verbalized understanding       PT Short Term Goals - 11/28/17 1014      PT SHORT TERM GOAL #1   Title  educatioin on vaginal health to reduce irritatioin    Time  4    Period  Weeks    Status  Achieved      PT SHORT TERM GOAL #2   Title  education on correct toileting to reduce strain on pelvic floor    Time  4    Period  Weeks    Status  Achieved      PT SHORT TERM GOAL #3   Title  education on perineal massage to reduce restrictions to  the episiotomy scar and posterior forchette    Time  4    Period  Weeks    Status  Achieved      PT SHORT TERM GOAL #4   Title  education on vaginal moisturizers and lubricants to promote improve vaginal health    Time  4    Period  Weeks    Status  Achieved        PT Long Term Goals - 10/30/17 0957      PT LONG TERM GOAL #1   Title  independent wIth HEP and understand how to progress herself    Time  12    Period  Weeks    Status  New    Target Date  01/22/18      PT LONG TERM GOAL #2   Title  Marinoff score </= 1/3 due to improved tissue mobility and reduction of trigger points    Time  12    Target Date  01/22/18      PT LONG TERM GOAL #3   Title  Pelvic floor strength >/= 4/5 so her urinary leakage is minimal to none and has a circular contraction with lift    Time  12    Period  Weeks    Status  New    Target Date  01/22/18      PT LONG TERM GOAL #4   Title  pain with sitting and crossing her legs due to reduction of Lychens Sclerosis symptoms due to improve vaginal health    Time  12    Period  Weeks    Status  New    Target Date  01/22/18            Plan - 11/28/17 0857    Clinical Impression Statement  Patient reports her abdominal pain is 60% better. Patient had trigger points in the ischiocavernosus and bulbocavernosus.  Patient understands how to perform perineal massage to elongate scar tissue around the clitoris.  Patient is still having pain with intercourse. Patient has pain with sitting and crossing her legs. Patient still as pain with wiping. Patient reports around the C-section area is feeling  better.  Patient will benefit from skilled therapy to reduce her pain, reduce scar tissue in the vulva and clitoral area and improve muscle strength.     Clinical Impairments Affecting Rehab Potential  C-section scar; appendectomy, diabetes, Hernia repair 2x, Hypertension, Bipolor; will have gastric bypass surgery 12/24/2017    PT Frequency  2x / week    PT  Duration  12 weeks    PT Treatment/Interventions  Biofeedback;Cryotherapy;Electrical Stimulation;Moist Heat;Ultrasound;Therapeutic exercise;Therapeutic activities;Neuromuscular re-education;Patient/family education;Scar mobilization;Manual techniques;Passive range of motion;Dry needling    PT Next Visit Plan  internal soft tissue work, bulging of pelvic floor; abdominal bracing;     Consulted and Agree with Plan of Care  Patient       Patient will benefit from skilled therapeutic intervention in order to improve the following deficits and impairments:  Decreased skin integrity, Increased fascial restricitons, Pain, Decreased mobility, Decreased scar mobility, Increased muscle spasms, Decreased range of motion, Decreased activity tolerance, Decreased strength  Visit Diagnosis: Muscle weakness (generalized)  Cramp and spasm  Unspecified lack of coordination     Problem List Patient Active Problem List   Diagnosis Date Noted  . Costochondritis 08/08/2017  . Diabetes mellitus without complication ([place]) 52/12/221  . Essential hypertension 07/24/2017  . Left-sided chest wall pain 07/21/2017  . DOE (dyspnea on exertion) 07/21/2017  . Chest pain 07/21/2017  . Abnormal finding on EKG 07/21/2017    Earlie Counts, PT 11/28/17 11:18 AM   [facility] [address], [place], [place], [address] Phone: [phone]   Fax:  [phone]  Name: Suzanne Gutierrez MRN: [id number] Date of Birth: 07-Jun-1965

## 2017-11-28 NOTE — Patient Instructions (Addendum)
Irritants/Allergens ( on exposure, can cause immediate stinging or burning)  Body fluids: urine, feces, vaginal discharge, sweat, semen  Feminine Hygiene Products: douches, yogurt douche, feminine wipes, baby wipes, sanitary pads, panty liners, tampons, deodorants (including deodorants in tampons/pads), lotions, powders (including talcum powder), perfumes, shampoos,soaps  Sexual support: lubricants, condoms, diaphragms, spermicides, arousal stimulants  Laundry detergent, bleach, fabric softener  Cleansing products: soap, bubble baths and salts, shampoo, conditioner, perfumed toielt paper  Physical irritants: tight fitting clothes, Nylon underwear, synthetic undergarments/pantyhose, chemically treated clothing, latex, wash cloths, sponges, hot water, excessive washing, vigorous drying with towel/hair dryer  HPV medication, tea tree oil, [place]  Alcohol and astringents  Topical medicaments: Benzocaine, Neomycin, Chlorhexidine (in K-Y Jelly), Imidazole antifungal, Propylene glycol ( Preservative used in many products:, tea tree oil, preservatives and bases medications are placed in.   Adapted from the V Book by Noland Fordyce. Nicole Kindred, MD., and Lynnell Dike [facility] Books, 2002), and Proceedings in Obstetrics and Gynecology, 2014; 4(2):1    Gentle Vulvar care- Adapted from the National Vulvodynia Association and the V Book  Wear loose clothing.  Wear all-cotton underwear, and go without when at home. Avoid wearing pantyhose, wear thigh high or knee high pantyhose. If you do not use underwear with yoga pants, try using an all-cotton against the vulva. Tight clothing can be irritating to the vulva by increasing friction and exacerbate irritation and redness.  Wear loose fitting clothes when possible. When swimming, remove wet bathing suits and shower to remove cholerine from the area.Marland Kitchen  Avoid hot tubs and heavily chlorinated pools. Shower after exercise to eliminate excess consider avoiding  exercise that causes friction or pressure to the area such as horseback riding, bicycle riding, and spinning classes.   To cleanse the area, use your fingers instead of a washcloth and plain water.  If you feel you must use a cleanser, unscented, non-alkaline cleanser such as Cetaphil, or mild soaps made for sensitive skin such as Dove or Neurtrogena. Be sure any cleaners are unscented. Avoid rubbing the vulva. Soak for five minutes in lukewarm water to remove any residue of sweat or lotions or other products.  Pat dry, and apply any prescribed medication. Avoid products with multiple ingredients.  Even those that sound designed for vulvar care, like A& D Original Ointment, baby lotion, or Vagisil, contain chemicals that could irritate or cause contact dermatitis. Avoid using bubble bath, feminine hygiene sprays, or any type of perfumed creams or sprays near the vulva.  In the bathroom, forgo moistened wipes. If you want moisture, use a spray bottle with plain water, and then pat dry. Use soft, white, unscented toilet paper. Avoid repetitive back and forth motion or rubbing. Drink plenty of water and fluids to dilute urine. Concentrated urine can be irritating to the vulvar skin.   When doing laundry, use hypoallergenic laundry detergent that is very mild, such as those made for babies. Avoid using fabric softeners with undergarments and double rinse undergarments.  For sexual intercourse, be sure to use a lubricant that is without additives or preservatives.  Additives like bactericides, spermicides, warming agents, and flavors can cause vulvar irritation. Rinse the vulva after intercourse. A frozen gel pack wrapped in a towel can be used for irritation after exercise or intercourse.    Lubrication . Used for intercourse to reduce friction . Avoid ones that have glycerin, warming gels, tingling gels, icing or cooling gel, scented . Avoid parabens due to a preservative similar to female sex  hormone . May need  to be reapplied once or several times during sexual activity . Can be applied to both partners genitals prior to vaginal penetration to minimize friction or irritation . Prevent irritation and mucosal tears that cause post coital pain and increased the risk of vaginal and urinary tract infections . Oil-based lubricants cannot be used with condoms due to breaking them down.  Least likely to irritate vaginal tissue.  . Plant based-lubes are safe . Silicone-based lubrication are thicker and last long and used for post-menopausal women  Vaginal Lubricators Here is a list of some suggested lubricators you can use for intercourse. Use the most hypoallergenic product.  You can place on you or your partner.   Slippery Stuff  Sylk or Sliquid Natural H2O ( good  if frequent UTI's)  Blossom Organics (www.blossom-organics.com)  Luvena   Coconut oil  PJur Woman Nude- water based lubricant, amazon  Uberlube- Amazon  Aloe Vera  Yes lubricant- [employer] Platinum-Silicone, Target, [facility]  Olive and Bee intimate cream-  www.oliveandbee.com.au Things to avoid in lubricants are glycerin, warming gels, tingling gels, icing or cooling  gels, and scented gels.  Also avoid Vaseline. KY jelly, Replens, and Astroglide kills good bacteria(lactobacilli)  Things to avoid in the vaginal area . Do not use things to irritate the vulvar area . No lotions- see below . No soaps; can use Aveeno or Calendula cleanser if needed. Must be gentle . No deodorants . No douches . Good to sleep without underwear to let the vaginal area to air out . No scrubbing: spread the lips to let warm water rinse over labias and pat dry  Creams that can be used on the Ione Releveum or [place] [employer] . They are used in the vagina to hydrate the mucous  membrane that make up the vaginal canal. . Designed to keep a more normal acid balance (ph) . Once placed in the vagina, it will last between two to three days.  . Use 2-3 times per week at bedtime and last longer than 60 min. . Ingredients to avoid is glycerin and fragrance, can increase chance of infection . Should not be used just before sex due to causing irritation . Most are gels administered either in a tampon-shaped applicator or as a vaginal suppository. They are non-hormonal.   Types of Moisturizers . Samul Dada- drug store . Vitamin E vaginal suppositories- Whole foods, Amazon . Moist Again . Coconut oil- can break down condoms . Michail Jewels . Yes moisturizer- amazon . NeuEve Silk , NeuEve Silver for menopausal or over 65 (if have severe vaginal atrophy or cancer treatments use NeuEve Silk for  1 month than move to The Pepsi)- [employer], MapleFlower.dk . Olive and Bee intimate cream- www.oliveandbee.com.au  Creams to use externally on the Vulva area  Albertson's (good for for cancer patients that had radiation to the area)- [place] or Danaher Corporation.FlyingBasics.com.br  V-magic cream - amazon  Julva-amazon  Vital "V Wild Yam salve ( help moisturize and help with thinning vulvar area, does have Emily   Things to avoid in the vaginal area . Do not use things to irritate the vulvar area . No lotions just specialized creams for the vulva area- Neogyn, V-magic, No soaps; can use Aveeno or Calendula cleanser if needed. Must  be gentle . No deodorants . No douches . Good to sleep without underwear to let the vaginal area to air out . No scrubbing: spread the lips to let warm water rinse over labias and pat dry    [facility] [address], [place], [place] [address] Phone # [phone] Fax [phone]

## 2017-12-05 ENCOUNTER — Ambulatory Visit: Payer: 59 | Admitting: Physical Therapy

## 2017-12-12 ENCOUNTER — Encounter: Payer: Self-pay | Admitting: Physical Therapy

## 2017-12-12 ENCOUNTER — Ambulatory Visit: Payer: 59 | Admitting: Physical Therapy

## 2017-12-12 DIAGNOSIS — M6281 Muscle weakness (generalized): Secondary | ICD-10-CM

## 2017-12-12 DIAGNOSIS — R252 Cramp and spasm: Secondary | ICD-10-CM

## 2017-12-12 DIAGNOSIS — R279 Unspecified lack of coordination: Secondary | ICD-10-CM

## 2017-12-12 NOTE — Patient Instructions (Signed)
Access Code: [email or web address]  URL: [email or web address]  Date: 12/12/2017  Prepared by: Earlie Counts   Exercises  Supine Piriformis Stretch Pulling Heel to Hip - 2 reps - 1 sets - 30 hold - 1x daily - 7x weekly  Supine Piriformis Stretch - 2 reps - 1 sets - 30 hold - 1x daily - 7x weekly  Seated Piriformis Stretch - 2 reps - 1 sets - 30 hold - 1x daily - 7x weekly  Seated Hamstring Stretch - 2 reps - 1 sets - 30 hold - 1x daily - 7x weekly  Child's Pose Stretch - 2 reps - 1 sets - 30 hold - 1x daily - 7x weekly  Child's Pose with Sidebending - 2 reps - 1 sets - 30 hold - 1x daily - 7x weekly  Prone Press Up - 5 reps - 1 sets - 5 hold - 1x daily - 7x weekly  Beginner Bridge - 10 reps - 1 sets - 5 hold - 1x daily - 7x weekly  Supine Diaphragmatic Breathing - 10 reps - 1 sets - 5 hold - 1x daily - 7x weekly  Cat Cow - 10 reps - 1 sets - 1x daily - 7x weekly  Hooklying Isometric Hip Flexion - 10 reps - 1 sets - 5 hold - 1x daily - 7x weekly  Bent Knee Fallouts with Alternating Legs - 10 reps - 1 sets - 1x daily - 7x weekly  Supine Transversus Abdominis Bracing - Hands on Ground - 10 reps - 1 sets - 5 sec hold - 1x daily - 7x weekly  [facility] [address] [place], [place] [address] Phone # [phone] Fax [phone]

## 2017-12-12 NOTE — Therapy (Signed)
[facility] [address] [place], [place], [address] Phone: [phone]   Fax:  [phone]  Physical Therapy Treatment  Patient Details  Name: Suzanne Gutierrez MRN: [id number] Date of Birth: 1966/05/20 Referring Provider: Dr. Thomasene Lot Ciriacks   Encounter Date: 12/12/2017  PT End of Session - 12/12/17 0901    Visit Number  5    Date for PT Re-Evaluation  01/22/18    Authorization Type  UHC    Authorization - Visit Number  5    Authorization - Number of Visits  40    PT Start Time  0845    PT Stop Time  0925    PT Time Calculation (min)  40 min    Activity Tolerance  Patient tolerated treatment well    Behavior During Therapy  [facility] for tasks assessed/performed       Past Medical History:  Diagnosis Date  . Bipolar disorder ([place])   . Diabetes mellitus without complication ([place])   . GERD (gastroesophageal reflux disease)   . Headache    Migraine  . Hypertension   . Lichen sclerosus of female genitalia   . Morbid obesity with BMI of 40.0-44.9, adult ([place])    Has been in the process of evaluation for GOP  . Osteoarthritis     Past Surgical History:  Procedure Laterality Date  . APPENDECTOMY  1994  . CARDIAC CATHETERIZATION  2016   [facility] Olin Hauser --was told she had no significant disease.  Marland Kitchen CESAREAN SECTION    . HERNIA REPAIR     X2  . NM MYOVIEW LTD  07/27/2017    LOW risk.  EF 67%.  No reversible perfusion noted.  There is a fixed defect in the lateral wall toward the apex likely breast attenuation.  . TRANSTHORACIC ECHOCARDIOGRAM  07/27/2017   No regional wall motion abnormality.  (Poor quality).  Mild RV dilation.  Mild LA dilation.  . TUBAL LIGATION    . WISDOM TOOTH EXTRACTION      There were no vitals filed for this visit.  Subjective Assessment - 12/12/17 0847    Subjective  Things are feeling better slowly. Pain with wiping is 15% better. I have not had intercourse due to being  scared of pain. No flare-up right now. I have been doing the self massage. My stomach is softer since we have been doing the soft tissue work.     Patient Stated Goals  comfortable sex; decreased pain with wiping, terrified of pain with vaginal exam    Currently in Pain?  Yes    Pain Score  4     Pain Location  Vagina    Pain Orientation  Mid    Pain Descriptors / Indicators  Burning    Pain Type  Acute pain    Pain Onset  More than a month ago    Pain Frequency  Intermittent    Aggravating Factors   wiping, sitting with legs crossed 2/10;     Pain Relieving Factors  ice    Multiple Pain Sites  No                    Pelvic Floor Special Questions - 12/12/17 0001    Pelvic Floor Internal Exam  Patient confirms identification and approved PT to assess pelvic floor and approve treatment    Exam Type  Vaginal    Strength  weak squeeze, no lift able to bulge now  [place] Adult PT Treatment/Exercise - 12/12/17 0001      Lumbar Exercises: Stretches   Active Hamstring Stretch  Right;Left;1 rep;30 seconds sitting    Hip Flexor Stretch  Right;Left;1 rep;30 seconds sitting    Pelvic Tilt  5 reps;5 seconds sitting    Piriformis Stretch  Right;Left;1 rep;30 seconds sitting      Manual Therapy   Manual Therapy  Soft tissue mobilization;Myofascial release    Myofascial Release  along the right labia  and intoritus    Internal Pelvic Floor  along the posterior introitus             PT Education - 12/12/17 0910    Education Details  Access Code: [email or web address]     Person(s) Educated  Patient    Methods  Explanation;Demonstration;Verbal cues;Handout    Comprehension  Returned demonstration;Verbalized understanding       PT Short Term Goals - 11/28/17 1014      PT SHORT TERM GOAL #1   Title  educatioin on vaginal health to reduce irritatioin    Time  4    Period  Weeks    Status  Achieved      PT SHORT TERM GOAL #2   Title  education on correct toileting to reduce  strain on pelvic floor    Time  4    Period  Weeks    Status  Achieved      PT SHORT TERM GOAL #3   Title  education on perineal massage to reduce restrictions to the episiotomy scar and posterior forchette    Time  4    Period  Weeks    Status  Achieved      PT SHORT TERM GOAL #4   Title  education on vaginal moisturizers and lubricants to promote improve vaginal health    Time  4    Period  Weeks    Status  Achieved        PT Long Term Goals - 10/30/17 0957      PT LONG TERM GOAL #1   Title  independent wIth HEP and understand how to progress herself    Time  12    Period  Weeks    Status  New    Target Date  01/22/18      PT LONG TERM GOAL #2   Title  Marinoff score </= 1/3 due to improved tissue mobility and reduction of trigger points    Time  12    Target Date  01/22/18      PT LONG TERM GOAL #3   Title  Pelvic floor strength >/= 4/5 so her urinary leakage is minimal to none and has a circular contraction with lift    Time  12    Period  Weeks    Status  New    Target Date  01/22/18      PT LONG TERM GOAL #4   Title  pain with sitting and crossing her legs due to reduction of Lychens Sclerosis symptoms due to improve vaginal health    Time  12    Period  Weeks    Status  New    Target Date  01/22/18            Plan - 12/12/17 0851    Clinical Impression Statement  Patient reports wiping is 15% better.  Patient is now able to bulge the area after the manual work. Pelvic floor strength is 2/5.  Patient has tightness in  the posterior introitus.  Patient is independent with her current HEP. Patient has learned new abodminal exercises to work the core.  Patient reports her adomen is softer after doing soft tissue work. Patient will benefi tfrom skilled therapy to reduce her pain, reduce scar tissue  in the vulva and clitoral area and improve muscle strength.     Clinical Impairments Affecting Rehab Potential  C-section scar; appendectomy, diabetes, Hernia  repair 2x, Hypertension, Bipolor; will have gastric bypass surgery 12/24/2017    PT Frequency  2x / week    PT Duration  12 weeks    PT Treatment/Interventions  Biofeedback;Cryotherapy;Electrical Stimulation;Moist Heat;Ultrasound;Therapeutic exercise;Therapeutic activities;Neuromuscular re-education;Patient/family education;Scar mobilization;Manual techniques;Passive range of motion;Dry needling    PT Next Visit Plan  internal soft tissue work, bulging of pelvic floor; abdominal bracing;     PT Home Exercise Plan  Access Code: [email or web address]     Consulted and Agree with Plan of Care  Patient       Patient will benefit from skilled therapeutic intervention in order to improve the following deficits and impairments:  Decreased skin integrity, Increased fascial restricitons, Pain, Decreased mobility, Decreased scar mobility, Increased muscle spasms, Decreased range of motion, Decreased activity tolerance, Decreased strength  Visit Diagnosis: Muscle weakness (generalized)  Cramp and spasm  Unspecified lack of coordination     Problem List Patient Active Problem List   Diagnosis Date Noted  . Costochondritis 08/08/2017  . Diabetes mellitus without complication ([place]) 72/62/0355  . Essential hypertension 07/24/2017  . Left-sided chest wall pain 07/21/2017  . DOE (dyspnea on exertion) 07/21/2017  . Chest pain 07/21/2017  . Abnormal finding on EKG 07/21/2017    Earlie Counts, PT 12/12/17 9:34 AM   [facility] [address], [place], [place], [address] Phone: [phone]   Fax:  [phone]  Name: Suzanne Gutierrez MRN: [id number] Date of Birth: 1966-04-10

## 2017-12-14 ENCOUNTER — Ambulatory Visit: Payer: Self-pay | Admitting: General Surgery

## 2017-12-18 NOTE — Patient Instructions (Addendum)
Suzanne Gutierrez  12/18/2017   Your procedure is scheduled on: 12-25-17   Report to [facility] Main  Entrance    Report to admitting at 5:30AM    Call this number if you have problems the morning of surgery [phone]     Remember: NO SOLID FOOD AFTER 6:00 PM THE NIGHT BEFORE YOUR SURGERY. YOU MAY DRINK CLEAR FLUIDS. MORNING OF SURGERY DRINK:  1SHAKE BEFORE YOU LEAVE HOME, DRINK ALL OF THE SHAKE AT ONE TIME. THE SHAKE YOU DRINK BEFORE YOU LEAVE HOME WILL BE THE LAST FLUIDS YOU DRINK BEFORE SURGERY.    CLEAR LIQUID DIET   Foods Allowed                                                                     Foods Excluded  Coffee and tea, regular and decaf                             liquids that you cannot  Plain Jell-O in any flavor                                             see through such as: Fruit ices (not with fruit pulp)                                     milk, soups, orange juice  Iced Popsicles                                    All solid food Carbonated beverages, regular and diet                                    Cranberry, grape and apple juices Sports drinks like Gatorade Lightly seasoned clear broth or consume(fat free) Sugar, honey syrup  Sample Menu Breakfast                                Lunch                                     Supper Cranberry juice                    Beef broth                            Chicken broth Jell-O                                     Grape juice  Apple juice Coffee or tea                        Jell-O                                      Popsicle                                                Coffee or tea                        Coffee or tea  _____________________________________________________________________   PAIN IS EXPECTED AFTER SURGERY AND WILL NOT BE COMPLETELY ELIMINATED. AMBULATION AND TYLENOL WILL HELP REDUCE INCISIONAL AND GAS PAIN. MOVEMENT IS KEY!  YOU ARE  EXPECTED TO BE OUT OF BED WITHIN 4 HOURS OF ADMISSION TO YOUR PATIENT ROOM.  SITTING IN THE RECLINER THROUGHOUT THE DAY IS IMPORTANT FOR DRINKING FLUIDS AND MOVING GAS THROUGHOUT THE GI TRACT.  COMPRESSION STOCKINGS SHOULD BE WORN [place] UNLESS YOU ARE WALKING.   INCENTIVE SPIROMETER SHOULD BE USED EVERY HOUR WHILE AWAKE TO DECREASE POST-OPERATIVE COMPLICATIONS SUCH AS PNEUMONIA.  WHEN DISCHARGED HOME, IT IS IMPORTANT TO CONTINUE TO WALK EVERY HOUR AND USE THE INCENTIVE SPIROMETER EVERY HOUR.          Take these medicines the morning of surgery with A SIP OF WATER: METOPROLOL, OMEPRAZOLE, RIZATRIPTAN IF NEEDED                                You may not have any metal on your body including hair pins and              piercings  Do not wear jewelry, make-up, lotions, powders or perfumes, deodorant             Do not wear nail polish.  Do not shave  48 hours prior to surgery.           Do not bring valuables to the hospital. [facility].  Contacts, dentures or bridgework may not be worn into surgery.  Leave suitcase in the car. After surgery it may be brought to your room.                Please read over the following fact sheets you were given: _____________________________________________________________________             Suzanne Gutierrez: Suzanne [facility] - Preparing for Surgery Before surgery, you can play an important role.  Because skin is not sterile, your skin needs to be as free of germs as possible.  You can reduce the number of germs on your skin by washing with CHG (chlorahexidine gluconate) soap before surgery.  CHG is an antiseptic cleaner which kills germs and bonds with the skin to continue killing germs even after washing. Please DO NOT use if you have an allergy to CHG or antibacterial soaps.  If your skin becomes reddened/irritated stop using the CHG and inform your nurse when you arrive at Short Stay. Do not shave  (including legs and underarms)  for at least 48 hours prior to the first CHG shower.  You may shave your face/neck. Please follow these instructions carefully:  1.  Shower with CHG Soap the night before surgery and the  morning of Surgery.  2.  If you choose to wash your hair, wash your hair first as usual with your  normal  shampoo.  3.  After you shampoo, rinse your hair and body thoroughly to remove the  shampoo.                           4.  Use CHG as you would any other liquid soap.  You can apply chg directly  to the skin and wash                       Gently with a scrungie or clean washcloth.  5.  Apply the CHG Soap to your body ONLY FROM THE NECK DOWN.   Do not use on face/ open                           Wound or open sores. Avoid contact with eyes, ears mouth and genitals (private parts).                       Wash face,  Genitals (private parts) with your normal soap.             6.  Wash thoroughly, paying special attention to the area where your surgery  will be performed.  7.  Thoroughly rinse your body with warm water from the neck down.  8.  DO NOT shower/wash with your normal soap after using and rinsing off  the CHG Soap.                9.  Pat yourself dry with a clean towel.            10.  Wear clean pajamas.            11.  Place clean sheets on your bed the night of your first shower and do not  sleep with pets. Day of Surgery : Do not apply any lotions/deodorants the morning of surgery.  Please wear clean clothes to the hospital/surgery [facility].  FAILURE TO FOLLOW THESE INSTRUCTIONS MAY RESULT IN THE CANCELLATION OF YOUR SURGERY PATIENT SIGNATURE_________________________________  NURSE SIGNATURE__________________________________  ________________________________________________________________________   Suzanne Gut[facility]rez  An incentive spirometer is a tool that can help keep your lungs clear and active. This tool measures how well you are filling your lungs with  each breath. Taking long deep breaths may help reverse or decrease the chance of developing breathing (pulmonary) problems (especially infection) following:  A long period of time when you are unable to move or be active. BEFORE THE PROCEDURE   If the spirometer includes an indicator to show your best effort, your nurse or respiratory therapist will set it to a desired goal.  If possible, sit up straight or lean slightly forward. Try not to slouch.  Hold the incentive spirometer in an upright position. INSTRUCTIONS FOR USE  1. Sit on the edge of your bed if possible, or sit up as far as you can in bed or on a chair. 2. Hold the incentive spirometer in an upright position. 3. Breathe out normally. 4. Place the mouthpiece in your mouth and seal your lips tightly  around it. 5. Breathe in slowly and as deeply as possible, raising the piston or the ball toward the top of the column. 6. Hold your breath for 3-5 seconds or for as long as possible. Allow the piston or ball to fall to the bottom of the column. 7. Remove the mouthpiece from your mouth and breathe out normally. 8. Rest for a few seconds and repeat Steps 1 through 7 at least 10 times every 1-2 hours when you are awake. Take your time and take a few normal breaths between deep breaths. 9. The spirometer may include an indicator to show your best effort. Use the indicator as a goal to work toward during each repetition. 10. After each set of 10 deep breaths, practice coughing to be sure your lungs are clear. If you have an incision (the cut made at the time of surgery), support your incision when coughing by placing a pillow or rolled up towels firmly against it. Once you are able to get out of bed, walk around indoors and cough well. You may stop using the incentive spirometer when instructed by your caregiver.  RISKS AND COMPLICATIONS  Take your time so you do not get dizzy or light-headed.  If you are in pain, you may need to take or  ask for pain medication before doing incentive spirometry. It is harder to take a deep breath if you are having pain. AFTER USE  Rest and breathe slowly and easily.  It can be helpful to keep track of a log of your progress. Your caregiver can provide you with a simple table to help with this. If you are using the spirometer at home, follow these instructions: [place] IF:   You are having difficultly using the spirometer.  You have trouble using the spirometer as often as instructed.  Your pain medication is not giving enough relief while using the spirometer.  You develop fever of 100.5 F (38.1 C) or higher. SEEK IMMEDIATE MEDICAL CARE IF:   You cough up bloody sputum that had not been present before.  You develop fever of 102 F (38.9 C) or greater.  You develop worsening pain at or near the incision site. MAKE SURE YOU:   Understand these instructions.  Will watch your condition.  Will get help right away if you are not doing well or get worse. Document Released: 09/26/2006 Document Revised: 08/08/2011 Document Reviewed: 11/27/2006 [facility] Patient Information 2014 [place], [place].   ________________________________________________________________________

## 2017-12-18 NOTE — Progress Notes (Signed)
LOV CARDS DR. HARDING 08-08-17 Epic   ECHO/STRESS TEST 07-27-17 Epic   EKG , CXR 07-26-17 Epic

## 2017-12-19 ENCOUNTER — Other Ambulatory Visit: Payer: Self-pay

## 2017-12-19 ENCOUNTER — Encounter (HOSPITAL_COMMUNITY): Payer: Self-pay

## 2017-12-19 ENCOUNTER — Encounter (HOSPITAL_COMMUNITY)
Admission: RE | Admit: 2017-12-19 | Discharge: 2017-12-19 | Disposition: A | Discharge status: Home / Self Care | Payer: 59 | Source: Ambulatory Visit | Attending: General Surgery | Admitting: General Surgery

## 2017-12-19 DIAGNOSIS — Z01812 Encounter for preprocedural laboratory examination: Secondary | ICD-10-CM | POA: Insufficient documentation

## 2017-12-19 DIAGNOSIS — Z0183 Encounter for blood typing: Secondary | ICD-10-CM | POA: Diagnosis not present

## 2017-12-19 DIAGNOSIS — Z6841 Body Mass Index (BMI) 40.0 and over, adult: Secondary | ICD-10-CM | POA: Diagnosis not present

## 2017-12-19 HISTORY — DX: Other specified postprocedural states: Z98.890

## 2017-12-19 HISTORY — DX: Fever, unspecified: R50.9

## 2017-12-19 HISTORY — DX: Pure hypercholesterolemia, unspecified: E78.00

## 2017-12-19 HISTORY — DX: Nausea with vomiting, unspecified: R11.2

## 2017-12-19 LAB — CBC WITH DIFFERENTIAL/PLATELET
BASOS PCT: 1 %
Basophils Absolute: 0 10*3/uL (ref 0.0–0.1)
EOS ABS: 0.2 10*3/uL (ref 0.0–0.7)
Eosinophils Relative: 3 %
HCT: 40 % (ref 36.0–46.0)
Hemoglobin: 13.5 g/dL (ref 12.0–15.0)
Lymphocytes Relative: 31 %
Lymphs Abs: 1.9 10*3/uL (ref 0.7–4.0)
MCH: 31.4 pg (ref 26.0–34.0)
MCHC: 33.8 g/dL (ref 30.0–36.0)
MCV: 93 fL (ref 78.0–100.0)
MONO ABS: 0.4 10*3/uL (ref 0.1–1.0)
MONOS PCT: 7 %
NEUTROS ABS: 3.6 10*3/uL (ref 1.7–7.7)
Neutrophils Relative %: 58 %
PLATELETS: 324 10*3/uL (ref 150–400)
RBC: 4.3 MIL/uL (ref 3.87–5.11)
RDW: 12.7 % (ref 11.5–15.5)
WBC: 6.1 10*3/uL (ref 4.0–10.5)

## 2017-12-19 LAB — COMPREHENSIVE METABOLIC PANEL
ALBUMIN: 3.8 g/dL (ref 3.5–5.0)
ALT: 34 U/L (ref 0–44)
ANION GAP: 9 (ref 5–15)
AST: 24 U/L (ref 15–41)
Alkaline Phosphatase: 84 U/L (ref 38–126)
BUN: 11 mg/dL (ref 6–20)
CHLORIDE: 107 mmol/L (ref 98–111)
CO2: 26 mmol/L (ref 22–32)
Calcium: 9.2 mg/dL (ref 8.9–10.3)
Creatinine, Ser: 0.9 mg/dL (ref 0.44–1.00)
GFR calc non Af Amer: >60 mL/min (ref >60)
GLUCOSE: 126 mg/dL — AB (ref 70–99)
POTASSIUM: 4.3 mmol/L (ref 3.5–5.1)
SODIUM: 142 mmol/L (ref 135–145)
Total Bilirubin: 0.5 mg/dL (ref 0.3–1.2)
Total Protein: 7.1 g/dL (ref 6.5–8.1)

## 2017-12-19 LAB — HEMOGLOBIN A1C
Hgb A1c MFr Bld: 5.7 % — ABNORMAL HIGH (ref 4.8–5.6)
Mean Plasma Glucose: 116.89 mg/dL

## 2017-12-19 LAB — ABO/RH: ABO/RH(D): O POS

## 2017-12-21 ENCOUNTER — Encounter: Payer: Self-pay | Admitting: Physical Therapy

## 2017-12-21 ENCOUNTER — Ambulatory Visit: Payer: 59 | Admitting: Physical Therapy

## 2017-12-21 DIAGNOSIS — M6281 Muscle weakness (generalized): Secondary | ICD-10-CM

## 2017-12-21 DIAGNOSIS — R252 Cramp and spasm: Secondary | ICD-10-CM

## 2017-12-21 DIAGNOSIS — R279 Unspecified lack of coordination: Secondary | ICD-10-CM

## 2017-12-21 NOTE — Therapy (Addendum)
[facility] [address], [place], [place], [address] Phone: [phone]   Fax:  [phone]  Physical Therapy Treatment  Patient Details  Name: Suzanne Gutierrez MRN: [id number] Date of Birth: 1965-07-30 Referring Provider: Dr. Thomasene Lot Ciriacks   Encounter Date: 12/21/2017  PT End of Session - 12/21/17 1409    Visit Number  6    Date for PT Re-Evaluation  01/22/18    Authorization Type  [employer]    Authorization - Visit Number  6    Authorization - Number of Visits  40    PT Start Time  1400    PT Stop Time  1440    PT Time Calculation (min)  40 min    Activity Tolerance  Patient tolerated treatment well    Behavior During Therapy  [facility] for tasks assessed/performed       Past Medical History:  Diagnosis Date  . Bipolar disorder [facility])    patient denies; reports only having post partum depression 21 years ago   . Diabetes mellitus without complication ([place])   . Elevated temperature    at PAT appt 99.3 ; denies fevers at home nor cold/flu sx; reports hot flahses due to menopause   . GERD (gastroesophageal reflux disease)   . Headache    Migraine  . High cholesterol    per patient report   . Hypertension   . Lichen sclerosus of female genitalia   . Morbid obesity with BMI of 40.0-44.9, adult ([place])    Has been in the process of evaluation for GOP  . Osteoarthritis   . PONV (postoperative nausea and vomiting)     Past Surgical History:  Procedure Laterality Date  . APPENDECTOMY  1994  . CARDIAC CATHETERIZATION  2016   [facility] --was told she had no significant disease.  Marland Kitchen CARDIAC CATHETERIZATION  2019   reports she had this done at [facility] at the same time as ECHO   . CESAREAN SECTION    . HERNIA REPAIR     X2  . NM MYOVIEW LTD  07/27/2017    LOW risk.  EF 67%.  No reversible perfusion noted.  There is a fixed defect in the lateral wall toward the apex likely breast attenuation.  .  TRANSTHORACIC ECHOCARDIOGRAM  07/27/2017   No regional wall motion abnormality.  (Poor quality).  Mild RV dilation.  Mild LA dilation.  . TUBAL LIGATION    . WISDOM TOOTH EXTRACTION      There were no vitals filed for this visit.  Subjective Assessment - 12/21/17 1407    Subjective  Pain with wiping is sensitive and I feel like I may be having a flare-up. the bowel movements are painful. I am using alot of extra coconut oil.     Patient Stated Goals  comfortable sex; decreased pain with wiping, terrified of pain with vaginal exam    Currently in Pain?  Yes    Pain Score  2     Pain Location  Vagina    Pain Orientation  Mid    Pain Descriptors / Indicators  Burning itching    Pain Type  Acute pain    Pain Onset  More than a month ago    Pain Frequency  Intermittent    Aggravating Factors   wiping, sitting, with legs crossed     Pain Relieving Factors  ice    Multiple Pain Sites  No  [facility] PT Assessment - 12/21/17 0001      Assessment   Medical Diagnosis  G18.2 Lichen Sclerosis    Referring Provider  Dr. Lennette Bihari P Ciriacks    Onset Date/Surgical Date  10/28/16    Prior Therapy  none      Precautions   Precautions  None      Restrictions   Weight Bearing Restrictions  No      Home Environment   Living Environment  Private residence      Prior Function   Level of Independence  Independent    Vocation  Full time employment    Vocation Requirements  sitting    Leisure  none      Cognition   Overall Cognitive Status  Within Functional Limits for tasks assessed      AROM   Lumbar Extension  full    Lumbar - Left Side Bend  full      Strength   Right Hip Flexion  5/5    Right Hip External Rotation   5/5    Right Hip ABduction  4/5    Right Hip ADduction  5/5                Pelvic Floor Special Questions - 12/21/17 0001    Pelvic Floor Internal Exam  Patient confirms identification and approved PT to assess pelvic floor and approve treatment     Exam Type  Vaginal    Strength  fair squeeze, definite lift        [facility] Adult PT Treatment/Exercise - 12/21/17 0001      Self-Care   Self-Care  Other Self-Care Comments    Other Self-Care Comments   showed patient devices for vaginal massger      Neuro Re-ed    Neuro Re-ed Details   pelvic floor contraction with abdominal contraction using tactile cues in supine      Manual Therapy   Manual Therapy  Internal Pelvic Floor    Internal Pelvic Floor  along bilateral urethra sphincter, right introitus, posterior fourchette               PT Short Term Goals - 11/28/17 1014      PT SHORT TERM GOAL #1   Title  educatioin on vaginal health to reduce irritatioin    Time  4    Period  Weeks    Status  Achieved      PT SHORT TERM GOAL #2   Title  education on correct toileting to reduce strain on pelvic floor    Time  4    Period  Weeks    Status  Achieved      PT SHORT TERM GOAL #3   Title  education on perineal massage to reduce restrictions to the episiotomy scar and posterior forchette    Time  4    Period  Weeks    Status  Achieved      PT SHORT TERM GOAL #4   Title  education on vaginal moisturizers and lubricants to promote improve vaginal health    Time  4    Period  Weeks    Status  Achieved        PT Long Term Goals - 10/30/17 0957      PT LONG TERM GOAL #1   Title  independent wIth HEP and understand how to progress herself    Time  12    Period  Weeks    Status  New  [place] Date  01/22/18      PT LONG TERM GOAL #2   Title  Marinoff score </= 1/3 due to improved tissue mobility and reduction of trigger points    Time  12    [place] Date  01/22/18      PT LONG TERM GOAL #3   Title  Pelvic floor strength >/= 4/5 so her urinary leakage is minimal to none and has a circular contraction with lift    Time  12    Period  Weeks    Status  New    [place] Date  01/22/18      PT LONG TERM GOAL #4   Title  pain with sitting and crossing her legs due  to reduction of [place] Sclerosis symptoms due to improve vaginal health    Time  12    Period  Weeks    Status  New    [place] Date  01/22/18            Plan - 12/21/17 1409    Clinical Impression Statement  Pateint reports no change in her urinary leakage.  Patient reports she was able to have intercourse without pain.  Patient feels her Lichens is flaring up right now.  Patient has full lumbar ROM.  Patient has increased in bilateral hip strength.  After soft tissue work she was able to make a circular conrtraction with a lift for the pelvic floor.  Patient will be having gastric bypass surgery on 12/25/2017.  Patient will return to therapy after her surgeon gives her the okay.     Rehab Potential  Excellent    Clinical Impairments Affecting Rehab Potential  C-section scar; appendectomy, diabetes, Hernia repair 2x, Hypertension, Bipolor; will have gastric bypass surgery 12/24/2017    PT Frequency  2x / week    PT Duration  12 weeks    PT Treatment/Interventions  Biofeedback;Cryotherapy;Electrical Stimulation;Moist Heat;Ultrasound;Therapeutic exercise;Therapeutic activities;Neuromuscular re-education;Patient/family education;Scar mobilization;Manual techniques;Passive range of motion;Dry needling    PT Next Visit Plan  return to therapy after her surgeon doing the gastric bypass on 12/25/2017 gives her clearance    PT Home Exercise Plan  Access Code: [email or web address]     Consulted and Agree with Plan of Care  Patient       Patient will benefit from skilled therapeutic intervention in order to improve the following deficits and impairments:  Decreased skin integrity, Increased fascial restricitons, Pain, Decreased mobility, Decreased scar mobility, Increased muscle spasms, Decreased range of motion, Decreased activity tolerance, Decreased strength  Visit Diagnosis: Muscle weakness (generalized)  Cramp and spasm  Unspecified lack of coordination     Problem List Patient Active Problem List    Diagnosis Date Noted  . Costochondritis 08/08/2017  . Diabetes mellitus without complication ([place]) 15/83/0940  . Essential hypertension 07/24/2017  . Left-sided chest wall pain 07/21/2017  . DOE (dyspnea on exertion) 07/21/2017  . Chest pain 07/21/2017  . Abnormal finding on EKG 07/21/2017    Earlie Counts, PT 12/21/17 3:33 PM   [facility] [address] [place], [place], [address] Phone: [phone]   Fax:  [phone]  Name: CHASLYN EISEN MRN: [id number] Date of Birth: 1965-07-04 PHYSICAL THERAPY DISCHARGE SUMMARY  Visits from Start of Care: 6  Current functional level related to goals / functional outcomes: See above.    Remaining deficits: See above. Patient has not returned since last visit on 12/21/2017. Patient had surgery in the month of August for Route-n-y.  Education / Equipment: HEP Plan:                                                    Patient goals were partially met. Patient is being discharged due to a change in medical status.  Thank you for the referral. Earlie Counts, PT 02/20/18 12:24 PM  ?????

## 2017-12-22 NOTE — H&P (Signed)
History of Present Illness Suzanne Gutierrez T. Suzanne Skufca MD; 12/14/2017 10:10 AM) The patient is a 52 year old female who presents with obesity. Patient returns for her preoperative visit prior to planned laparoscopic Roux-en-Y gastric bypass. Patient is referred by Dr Alroy Dust for consideration for surgical treatment for morbid obesity. The patient gives a history of progressive obesity since her first pregnancy despite multiple attempts at medical management. She tended to gain weight with subsequent pregnancies. She has been able to lose up to 50 pounds with diet and exercise plans but then experiences progressive weight regain. Obesity has been affecting the patient in a number of ways including difficulty with activities of daily living and adjoining time with her family due to fatigue and shortness of breath.. Significant co-morbid illnesses have developed including hypertension and GERD. She is very concerned about her long-term health going forward at her current weight. She was hospitalized emergently about a year ago with severe chest pain and had a normal cardiac cath and this was felt to be secondary to reflux.. Reflux is controlled on medications but they are required.   She is successfully completed her preoperative workup. No concerns on psychologic or nutrition evaluation. Chest x-ray, ultrasound and lab work were unremarkable. H. pylori not yet done but we will order preoperatively. She generally has been feeling okay although has a new diagnosis of lichen sclerosis which is being treated topically. No other illnesses or hospitalizations or change in her medications.   Problem List/Past Medical Suzanne Gutierrez T. Suzanne Bazar, MD; 12/14/2017 10:10 AM) EPIGASTRIC PAIN (R10.13)  MORBID OBESITY, UNSPECIFIED OBESITY TYPE (E66.01)  PROLAPSED INTERNAL HEMORRHOIDS, GRADE 2 (K64.1)   Past Surgical History Suzanne Gutierrez T. Suzanne Usery, MD; 12/14/2017 10:10 AM) Appendectomy  Cesarean Section - 1  Foot  Surgery  Bilateral. Laparoscopic Inguinal Hernia Surgery  Right. Oral Surgery  Ventral / Umbilical Hernia Surgery  Right.  Diagnostic Studies History Suzanne Gutierrez T. Suzanne Fang, MD; 12/14/2017 10:10 AM) Colonoscopy  within last year 5-10 years ago Mammogram  within last year Pap Smear  1-5 years ago  Allergies Suzanne Gutierrez, RMA; 12/14/2017 9:55 AM) Codeine Phosphate *ANALGESICS - OPIOID*  Nausea. Allergies Reconciled   Medication History Suzanne Gutierrez, [place]; 12/14/2017 10:01 AM) Escitalopram Oxalate (10MG  Tablet, Oral) Active. TraMADol HCl (50MG  Tablet, Oral prn) Active. Toprol XL (50MG  Tablet ER 24HR, Oral as needed) Active. Zolpidem Tartrate (5MG  Tablet, 1/2 Oral qhs) Active. Omeprazole (40MG  Capsule ER, Oral as needed) Active. Zolpidem Tartrate (10MG  Tablet, Oral) Active. HydrOXYzine HCl (25MG  Tablet, Oral) Active. Clobetasol Propionate (0.05% Solution, External) Active. Ambien (10MG  Tablet, Oral) Active. Lipitor (20MG  Tablet, Oral) Active. Metoprolol Succinate ER (50MG  Tablet ER 24HR, Oral) Active. Lexapro (10MG  Tablet, Oral) Active. Rizatriptan Benzoate (10MG  Tablet, Oral) Active. Atorvastatin Calcium (10MG  Tablet, Oral) Active. Vistaril (25MG  Capsule, Oral) Active. Medications Reconciled  Social History Suzanne Gutierrez T. Suzanne Mandile, MD; 12/14/2017 10:10 AM) Alcohol use  Occasional alcohol use. Caffeine use  Carbonated beverages, Coffee, Tea. No drug use  Tobacco use  Never smoker.  Family History Suzanne Gutierrez T. Suzanne Kevorkian, MD; 12/14/2017 10:10 AM) Alcohol Abuse  Brother. Arthritis  Brother, Mother. Cerebrovascular Accident  Father, Mother, Sister. Depression  Brother, Mother, Sister. Colon Polyps  Mother. Diabetes Mellitus  Father. Heart Disease  Father, Mother. Hypertension  Brother, Mother. Ischemic Bowel Disease  Father. Kidney Disease  Father. Migraine Headache  Sister. Prostate Cancer  Brother. Respiratory Condition   Mother.  Pregnancy / Birth History Suzanne Gutierrez T. Wilbert Schouten, MD; 12/14/2017 10:10 AM) Age at menarche  69 years. Age of menopause  65-50 Contraceptive  History  Oral contraceptives. Gravida  2 Irregular periods  Maternal age  53-30 Para  2  Other Problems Suzanne Gutierrez T. Khasir Woodrome, MD; 12/14/2017 10:10 AM) Anxiety Disorder  Arthritis  Chest pain  Back Pain  Gastroesophageal Reflux Disease  Inguinal Hernia  Hemorrhoids  High blood pressure  Migraine Headache  Umbilical Hernia Repair   Vitals Suzanne Gutierrez RMA; 12/14/2017 9:55 AM) 12/14/2017 9:54 AM Weight: 222 lb Height: 62in Body Surface Area: 2 m Body Mass Index: 40.6 kg/m  Temp.: 99.30F  Pulse: 97 (Regular)  BP: 125/78 (Sitting, Left Arm, Standard)       Physical Exam Suzanne Gutierrez T. Suzanne Smestad MD; 12/14/2017 10:11 AM) The physical exam findings are as follows: Note:General: Alert, morbidly obese Caucasian female, in no distress Skin: Warm and dry without rash or infection. HEENT: No palpable masses or thyromegaly. Sclera nonicteric. Lungs: Breath sounds clear and equal. No wheezing or increased work of breathing. Cardiovascular: Regular rate and rhythm without murmer. No JVD or edema. Peripheral pulses intact. No carotid bruits. Abdomen: Nondistended. Soft and nontender. No masses palpable. No organomegaly. No palpable hernias. Extremities: No edema or joint swelling or deformity. No chronic venous stasis changes. Neurologic: Alert and fully oriented. Gait normal. No focal weakness. Psychiatric: Normal mood and affect. Thought content appropriate with normal judgement and insight    Assessment & Plan Suzanne Gutierrez T. Aerik Polan MD; 12/14/2017 10:13 AM) MORBID OBESITY WITH BMI OF 40.0-44.9, ADULT (E66.01) Impression: Patient with progressive morbid obesity unresponsive to multiple efforts at medical management who presents with a BMI of 41.4 and comorbidities of hypertension and significant GERD.. I  believe there would be very significant medical benefit from surgical weight loss. After our discussion of surgical options currently available the patient has decided to proceed with laparoscopic Roux-en-Y gastric bypass due to the specific effects on GERD.. We have discussed the nature of the problem and the risks of remaining morbidly obese. We discussed laparoscopic Roux-en-Y gastric bypass in detail including the nature of the procedure, expected hospitalization and recovery, and major risks of anesthetic complications, bleeding, blood clots and pulmonary emboli, leakage and infection and long-term risks of stricture, ulceration, bowel obstruction, nutritional deficiencies, and failure to lose weight or weight regain. We discussed these problems could lead to death. We will review the consent form. She has successfully completed her preoperative workup. H. pylori test will be performed preoperatively. Ready to proceed with laparoscopic Roux-en-Y gastric bypass.

## 2017-12-24 MED ORDER — BUPIVACAINE LIPOSOME 1.3 % IJ SUSP
20.0000 mL | INTRAMUSCULAR | Status: DC
  Filled 2017-12-24: qty 20

## 2017-12-25 ENCOUNTER — Other Ambulatory Visit: Payer: Self-pay

## 2017-12-25 ENCOUNTER — Inpatient Hospital Stay (HOSPITAL_COMMUNITY)
Admission: RE | Admit: 2017-12-25 | Discharge: 2017-12-26 | DRG: 621 | Disposition: A | Discharge status: Home / Self Care | Payer: 59 | Source: Ambulatory Visit | Attending: General Surgery | Admitting: General Surgery

## 2017-12-25 ENCOUNTER — Encounter (HOSPITAL_COMMUNITY): Payer: Self-pay

## 2017-12-25 ENCOUNTER — Encounter (HOSPITAL_COMMUNITY)
Admission: RE | Disposition: A | Discharge status: Home / Self Care | Payer: Self-pay | Source: Ambulatory Visit | Attending: General Surgery

## 2017-12-25 ENCOUNTER — Inpatient Hospital Stay (HOSPITAL_COMMUNITY): Payer: 59 | Admitting: Certified Registered Nurse Anesthetist

## 2017-12-25 DIAGNOSIS — M549 Dorsalgia, unspecified: Secondary | ICD-10-CM | POA: Diagnosis present

## 2017-12-25 DIAGNOSIS — E119 Type 2 diabetes mellitus without complications: Secondary | ICD-10-CM | POA: Diagnosis present

## 2017-12-25 DIAGNOSIS — Z8719 Personal history of other diseases of the digestive system: Secondary | ICD-10-CM | POA: Diagnosis not present

## 2017-12-25 DIAGNOSIS — Z6841 Body Mass Index (BMI) 40.0 and over, adult: Secondary | ICD-10-CM | POA: Diagnosis not present

## 2017-12-25 DIAGNOSIS — Z79899 Other long term (current) drug therapy: Secondary | ICD-10-CM | POA: Diagnosis not present

## 2017-12-25 DIAGNOSIS — M1990 Unspecified osteoarthritis, unspecified site: Secondary | ICD-10-CM | POA: Diagnosis present

## 2017-12-25 DIAGNOSIS — Z79891 Long term (current) use of opiate analgesic: Secondary | ICD-10-CM | POA: Diagnosis not present

## 2017-12-25 DIAGNOSIS — I10 Essential (primary) hypertension: Secondary | ICD-10-CM | POA: Diagnosis present

## 2017-12-25 DIAGNOSIS — R110 Nausea: Secondary | ICD-10-CM | POA: Diagnosis not present

## 2017-12-25 DIAGNOSIS — F319 Bipolar disorder, unspecified: Secondary | ICD-10-CM | POA: Diagnosis present

## 2017-12-25 DIAGNOSIS — K219 Gastro-esophageal reflux disease without esophagitis: Secondary | ICD-10-CM | POA: Diagnosis not present

## 2017-12-25 DIAGNOSIS — Z885 Allergy status to narcotic agent status: Secondary | ICD-10-CM

## 2017-12-25 DIAGNOSIS — G43909 Migraine, unspecified, not intractable, without status migrainosus: Secondary | ICD-10-CM | POA: Diagnosis present

## 2017-12-25 DIAGNOSIS — F419 Anxiety disorder, unspecified: Secondary | ICD-10-CM | POA: Diagnosis present

## 2017-12-25 DIAGNOSIS — M25512 Pain in left shoulder: Secondary | ICD-10-CM | POA: Diagnosis not present

## 2017-12-25 HISTORY — PX: GASTRIC ROUX-EN-Y: SHX5262

## 2017-12-25 LAB — TYPE AND SCREEN
ABO/RH(D): O POS
ANTIBODY SCREEN: NEGATIVE

## 2017-12-25 LAB — HEMOGLOBIN AND HEMATOCRIT, BLOOD
HCT: 36.2 % (ref 36.0–46.0)
HEMOGLOBIN: 12.2 g/dL (ref 12.0–15.0)

## 2017-12-25 LAB — GLUCOSE, CAPILLARY
Glucose-Capillary: 191 mg/dL — ABNORMAL HIGH (ref 70–99)
Glucose-Capillary: 152 mg/dL — ABNORMAL HIGH (ref 70–99)

## 2017-12-25 SURGERY — LAPAROSCOPIC ROUX-EN-Y GASTRIC BYPASS WITH UPPER ENDOSCOPY
Anesthesia: General | Site: Abdomen

## 2017-12-25 MED ORDER — ONDANSETRON HCL 4 MG/2ML IJ SOLN
INTRAMUSCULAR | Status: AC
  Filled 2017-12-25: qty 2

## 2017-12-25 MED ORDER — ROCURONIUM BROMIDE 10 MG/ML (PF) SYRINGE
PREFILLED_SYRINGE | INTRAVENOUS | Status: AC
  Filled 2017-12-25: qty 10

## 2017-12-25 MED ORDER — ENOXAPARIN SODIUM 30 MG/0.3ML ~~LOC~~ SOLN
30.0000 mg | Freq: Two times a day (BID) | SUBCUTANEOUS | Status: DC
  Administered 2017-12-26: 30 mg via SUBCUTANEOUS
  Filled 2017-12-25: qty 0.3

## 2017-12-25 MED ORDER — FENTANYL CITRATE (PF) 250 MCG/5ML IJ SOLN
INTRAMUSCULAR | Status: DC | PRN
  Administered 2017-12-25 (×3): 50 ug via INTRAVENOUS

## 2017-12-25 MED ORDER — LIDOCAINE 2% (20 MG/ML) 5 ML SYRINGE
INTRAMUSCULAR | Status: DC | PRN
  Administered 2017-12-25: 100 mg via INTRAVENOUS

## 2017-12-25 MED ORDER — PREMIER PROTEIN SHAKE
2.0000 [oz_av] | ORAL | Status: DC
  Administered 2017-12-26 (×3): 2 [oz_av] via ORAL

## 2017-12-25 MED ORDER — FENTANYL CITRATE (PF) 100 MCG/2ML IJ SOLN: 25.0000 ug | INTRAMUSCULAR | Status: DC | PRN

## 2017-12-25 MED ORDER — POTASSIUM CHLORIDE IN NACL 20-0.9 MEQ/L-% IV SOLN
INTRAVENOUS | Status: DC
  Administered 2017-12-25: 14:00:00 via INTRAVENOUS
  Administered 2017-12-26: 1000 mL via INTRAVENOUS
  Administered 2017-12-26: 12:00:00 via INTRAVENOUS
  Filled 2017-12-25 (×3): qty 1000

## 2017-12-25 MED ORDER — ACETAMINOPHEN 500 MG PO TABS
1000.0000 mg | ORAL_TABLET | ORAL | Status: AC
  Administered 2017-12-25: 1000 mg via ORAL
  Filled 2017-12-25: qty 2

## 2017-12-25 MED ORDER — ROCURONIUM BROMIDE 50 MG/5ML IV SOSY
PREFILLED_SYRINGE | INTRAVENOUS | Status: DC | PRN
  Administered 2017-12-25: 10 mg via INTRAVENOUS
  Administered 2017-12-25: 60 mg via INTRAVENOUS
  Administered 2017-12-25 (×2): 20 mg via INTRAVENOUS

## 2017-12-25 MED ORDER — SODIUM CHLORIDE 0.9 % IJ SOLN
INTRAMUSCULAR | Status: DC | PRN
  Administered 2017-12-25: 50 mL

## 2017-12-25 MED ORDER — OXYCODONE HCL 5 MG PO TABS: 5.0000 mg | ORAL_TABLET | Freq: Once | ORAL | Status: DC | PRN

## 2017-12-25 MED ORDER — METOPROLOL TARTRATE 5 MG/5ML IV SOLN
5.0000 mg | Freq: Four times a day (QID) | INTRAVENOUS | Status: DC
  Filled 2017-12-25: qty 5

## 2017-12-25 MED ORDER — ACETAMINOPHEN 160 MG/5ML PO SOLN
650.0000 mg | Freq: Four times a day (QID) | ORAL | Status: DC
  Administered 2017-12-25 – 2017-12-26 (×4): 650 mg via ORAL
  Filled 2017-12-25 (×5): qty 20.3

## 2017-12-25 MED ORDER — CELECOXIB 200 MG PO CAPS
400.0000 mg | ORAL_CAPSULE | ORAL | Status: AC
  Administered 2017-12-25: 400 mg via ORAL
  Filled 2017-12-25: qty 2

## 2017-12-25 MED ORDER — EPHEDRINE 5 MG/ML INJ
INTRAVENOUS | Status: AC
  Filled 2017-12-25: qty 10

## 2017-12-25 MED ORDER — LACTATED RINGERS IV SOLN
INTRAVENOUS | Status: DC
  Administered 2017-12-25 (×2): via INTRAVENOUS

## 2017-12-25 MED ORDER — APREPITANT 40 MG PO CAPS
40.0000 mg | ORAL_CAPSULE | ORAL | Status: AC
  Administered 2017-12-25: 40 mg via ORAL
  Filled 2017-12-25: qty 1

## 2017-12-25 MED ORDER — FENTANYL CITRATE (PF) 250 MCG/5ML IJ SOLN
INTRAMUSCULAR | Status: AC
  Filled 2017-12-25: qty 5

## 2017-12-25 MED ORDER — SODIUM CHLORIDE 0.9 % IJ SOLN
INTRAMUSCULAR | Status: AC
  Filled 2017-12-25: qty 50

## 2017-12-25 MED ORDER — LIDOCAINE 2% (20 MG/ML) 5 ML SYRINGE
INTRAMUSCULAR | Status: AC
  Filled 2017-12-25: qty 5

## 2017-12-25 MED ORDER — SUCCINYLCHOLINE CHLORIDE 200 MG/10ML IV SOSY
PREFILLED_SYRINGE | INTRAVENOUS | Status: AC
  Filled 2017-12-25: qty 10

## 2017-12-25 MED ORDER — SUGAMMADEX SODIUM 200 MG/2ML IV SOLN
INTRAVENOUS | Status: DC | PRN
  Administered 2017-12-25: 300 mg via INTRAVENOUS

## 2017-12-25 MED ORDER — STERILE WATER FOR IRRIGATION IR SOLN
Status: DC | PRN
  Administered 2017-12-25: 2000 mL

## 2017-12-25 MED ORDER — CHLORHEXIDINE GLUCONATE CLOTH 2 % EX PADS: 6.0000 | MEDICATED_PAD | Freq: Once | CUTANEOUS | Status: DC

## 2017-12-25 MED ORDER — EVICEL 5 ML EX KIT
PACK | Freq: Once | CUTANEOUS | Status: AC
  Administered 2017-12-25: 5 mL
  Filled 2017-12-25: qty 1

## 2017-12-25 MED ORDER — DEXAMETHASONE SODIUM PHOSPHATE 4 MG/ML IJ SOLN: 4.0000 mg | INTRAMUSCULAR | Status: DC

## 2017-12-25 MED ORDER — LIDOCAINE HCL 2 % IJ SOLN
INTRAMUSCULAR | Status: AC
  Filled 2017-12-25: qty 20

## 2017-12-25 MED ORDER — LACTATED RINGERS IR SOLN
Status: DC | PRN
  Administered 2017-12-25: 1000 mL

## 2017-12-25 MED ORDER — MIDAZOLAM HCL 5 MG/5ML IJ SOLN
INTRAMUSCULAR | Status: DC | PRN
  Administered 2017-12-25: 2 mg via INTRAVENOUS

## 2017-12-25 MED ORDER — ONDANSETRON HCL 4 MG/2ML IJ SOLN: 4.0000 mg | Freq: Four times a day (QID) | INTRAMUSCULAR | Status: DC | PRN

## 2017-12-25 MED ORDER — ONDANSETRON HCL 4 MG/2ML IJ SOLN
INTRAMUSCULAR | Status: DC | PRN
  Administered 2017-12-25: 4 mg via INTRAVENOUS

## 2017-12-25 MED ORDER — EPHEDRINE SULFATE-NACL 50-0.9 MG/10ML-% IV SOSY
PREFILLED_SYRINGE | INTRAVENOUS | Status: DC | PRN
  Administered 2017-12-25 (×4): 5 mg via INTRAVENOUS

## 2017-12-25 MED ORDER — HEPARIN SODIUM (PORCINE) 5000 UNIT/ML IJ SOLN
5000.0000 [IU] | INTRAMUSCULAR | Status: AC
  Administered 2017-12-25: 5000 [IU] via SUBCUTANEOUS
  Filled 2017-12-25: qty 1

## 2017-12-25 MED ORDER — BUPIVACAINE-EPINEPHRINE (PF) 0.25% -1:200000 IJ SOLN
INTRAMUSCULAR | Status: AC
  Filled 2017-12-25: qty 30

## 2017-12-25 MED ORDER — LIDOCAINE 2% (20 MG/ML) 5 ML SYRINGE
INTRAMUSCULAR | Status: DC | PRN
  Administered 2017-12-25: 1.5 mg/kg/h via INTRAVENOUS

## 2017-12-25 MED ORDER — DEXAMETHASONE SODIUM PHOSPHATE 10 MG/ML IJ SOLN
INTRAMUSCULAR | Status: AC
  Filled 2017-12-25: qty 1

## 2017-12-25 MED ORDER — SUCCINYLCHOLINE CHLORIDE 200 MG/10ML IV SOSY
PREFILLED_SYRINGE | INTRAVENOUS | Status: DC | PRN
  Administered 2017-12-25: 120 mg via INTRAVENOUS

## 2017-12-25 MED ORDER — MORPHINE SULFATE (PF) 2 MG/ML IV SOLN
1.0000 mg | INTRAVENOUS | Status: DC | PRN
  Administered 2017-12-25: 1 mg via INTRAVENOUS
  Filled 2017-12-25: qty 1

## 2017-12-25 MED ORDER — BUPIVACAINE-EPINEPHRINE 0.25% -1:200000 IJ SOLN
INTRAMUSCULAR | Status: DC | PRN
  Administered 2017-12-25: 30 mL

## 2017-12-25 MED ORDER — SCOPOLAMINE 1 MG/3DAYS TD PT72
1.0000 | MEDICATED_PATCH | TRANSDERMAL | Status: DC
  Administered 2017-12-25: 1.5 mg via TRANSDERMAL
  Filled 2017-12-25: qty 1

## 2017-12-25 MED ORDER — PROPOFOL 10 MG/ML IV BOLUS
INTRAVENOUS | Status: AC
  Filled 2017-12-25: qty 20

## 2017-12-25 MED ORDER — MIDAZOLAM HCL 2 MG/2ML IJ SOLN
INTRAMUSCULAR | Status: AC
  Filled 2017-12-25: qty 2

## 2017-12-25 MED ORDER — BUPIVACAINE LIPOSOME 1.3 % IJ SUSP
INTRAMUSCULAR | Status: DC | PRN
  Administered 2017-12-25: 20 mL

## 2017-12-25 MED ORDER — SUGAMMADEX SODIUM 500 MG/5ML IV SOLN
INTRAVENOUS | Status: AC
  Filled 2017-12-25: qty 5

## 2017-12-25 MED ORDER — ONDANSETRON HCL 4 MG/2ML IJ SOLN
4.0000 mg | INTRAMUSCULAR | Status: DC | PRN
  Administered 2017-12-25: 4 mg via INTRAVENOUS
  Filled 2017-12-25: qty 2

## 2017-12-25 MED ORDER — KETAMINE HCL 10 MG/ML IJ SOLN
INTRAMUSCULAR | Status: DC | PRN
  Administered 2017-12-25: 25 mg via INTRAVENOUS

## 2017-12-25 MED ORDER — PROPOFOL 10 MG/ML IV BOLUS
INTRAVENOUS | Status: DC | PRN
  Administered 2017-12-25: 200 mg via INTRAVENOUS

## 2017-12-25 MED ORDER — SODIUM CHLORIDE 0.9 % IV SOLN
2.0000 g | INTRAVENOUS | Status: AC
  Administered 2017-12-25: 2 g via INTRAVENOUS
  Filled 2017-12-25: qty 2

## 2017-12-25 MED ORDER — OXYCODONE HCL 5 MG/5ML PO SOLN
5.0000 mg | ORAL | Status: DC | PRN
  Administered 2017-12-25 – 2017-12-26 (×2): 5 mg via ORAL
  Filled 2017-12-25 (×2): qty 5

## 2017-12-25 MED ORDER — ZOLPIDEM TARTRATE 5 MG PO TABS
5.0000 mg | ORAL_TABLET | Freq: Every evening | ORAL | Status: DC | PRN
  Administered 2017-12-25: 5 mg via ORAL
  Filled 2017-12-25: qty 1

## 2017-12-25 MED ORDER — OXYCODONE HCL 5 MG/5ML PO SOLN
5.0000 mg | Freq: Once | ORAL | Status: DC | PRN
  Filled 2017-12-25: qty 5

## 2017-12-25 MED ORDER — KETAMINE HCL 10 MG/ML IJ SOLN
INTRAMUSCULAR | Status: AC
  Filled 2017-12-25: qty 1

## 2017-12-25 MED ORDER — FAMOTIDINE IN NACL 20-0.9 MG/50ML-% IV SOLN
20.0000 mg | Freq: Two times a day (BID) | INTRAVENOUS | Status: DC
  Administered 2017-12-25 – 2017-12-26 (×3): 20 mg via INTRAVENOUS
  Filled 2017-12-25 (×3): qty 50

## 2017-12-25 MED ORDER — DEXAMETHASONE SODIUM PHOSPHATE 10 MG/ML IJ SOLN
INTRAMUSCULAR | Status: DC | PRN
  Administered 2017-12-25: 5 mg via INTRAVENOUS

## 2017-12-25 MED ORDER — GABAPENTIN 300 MG PO CAPS
300.0000 mg | ORAL_CAPSULE | ORAL | Status: AC
  Administered 2017-12-25: 300 mg via ORAL
  Filled 2017-12-25: qty 1

## 2017-12-25 SURGICAL SUPPLY — 82 items
ADH SKN CLS APL DERMABOND .7 (GAUZE/BANDAGES/DRESSINGS) ×1
APPLIER CLIP ROT 10 11.4 M/L (STAPLE)
APPLIER CLIP ROT 13.4 12 LRG (CLIP)
APR CLP LRG 13.4X12 ROT 20 MLT (CLIP)
APR CLP MED LRG 11.4X10 (STAPLE)
BAG SPEC RTRVL LRG 6X4 10 (ENDOMECHANICALS)
BLADE SURG SZ11 CARB STEEL (BLADE) ×2 IMPLANT
CABLE HIGH FREQUENCY MONO STRZ (ELECTRODE) ×2 IMPLANT
CHLORAPREP W/TINT 26ML (MISCELLANEOUS) ×3 IMPLANT
CLIP APPLIE ROT 10 11.4 M/L (STAPLE) IMPLANT
CLIP APPLIE ROT 13.4 12 LRG (CLIP) IMPLANT
CLIP SUT LAPRA TY ABSORB (SUTURE) ×4 IMPLANT
CUTTER FLEX LINEAR 45M (STAPLE) ×2 IMPLANT
DECANTER SPIKE VIAL GLASS SM (MISCELLANEOUS) ×2 IMPLANT
DERMABOND ADVANCED (GAUZE/BANDAGES/DRESSINGS) ×1
DERMABOND ADVANCED .7 DNX12 (GAUZE/BANDAGES/DRESSINGS) ×1 IMPLANT
DEVICE SUT QUICK LOAD TK 5 (STAPLE) IMPLANT
DEVICE SUT TI-KNOT TK 5X26 (MISCELLANEOUS) IMPLANT
DEVICE SUTURE ENDOST 10MM (ENDOMECHANICALS) ×1 IMPLANT
DISSECTOR BLUNT TIP ENDO 5MM (MISCELLANEOUS) IMPLANT
DRAIN PENROSE 18X1/4 LTX STRL (WOUND CARE) ×2 IMPLANT
ELECT REM PT RETURN 15FT ADLT (MISCELLANEOUS) ×2 IMPLANT
GAUZE 4X4 16PLY RFD (DISPOSABLE) ×2 IMPLANT
GAUZE SPONGE 4X4 12PLY STRL (GAUZE/BANDAGES/DRESSINGS) ×2 IMPLANT
GLOVE BIOGEL PI IND STRL 7.5 (GLOVE) ×1 IMPLANT
GLOVE BIOGEL PI INDICATOR 7.5 (GLOVE) ×1
GLOVE ECLIPSE 7.5 STRL STRAW (GLOVE) ×2 IMPLANT
GOWN STRL REUS W/TWL XL LVL3 (GOWN DISPOSABLE) ×4 IMPLANT
HEMOSTAT SURGICEL 4X8 (HEMOSTASIS) IMPLANT
HOVERMATT SINGLE USE (MISCELLANEOUS) ×2 IMPLANT
KIT BASIN OR (CUSTOM PROCEDURE TRAY) ×2 IMPLANT
KIT GASTRIC LAVAGE 34FR ADT (SET/KITS/TRAYS/PACK) ×2 IMPLANT
LUBRICANT JELLY K Y 4OZ (MISCELLANEOUS) IMPLANT
MARKER SKIN DUAL TIP RULER LAB (MISCELLANEOUS) ×2 IMPLANT
NDL SPNL 22GX3.5 QUINCKE BK (NEEDLE) ×1 IMPLANT
NEEDLE SPNL 22GX3.5 QUINCKE BK (NEEDLE) ×2 IMPLANT
PACK CARDIOVASCULAR III (CUSTOM PROCEDURE TRAY) ×2 IMPLANT
POUCH SPECIMEN RETRIEVAL 10MM (ENDOMECHANICALS) IMPLANT
RELOAD 45 VASCULAR/THIN (ENDOMECHANICALS) ×4 IMPLANT
RELOAD ENDO STITCH 2.0 (ENDOMECHANICALS) ×22
RELOAD STAPLE 45 2.5 WHT GRN (ENDOMECHANICALS) ×1 IMPLANT
RELOAD STAPLE 45 3.5 BLU ETS (ENDOMECHANICALS) ×1 IMPLANT
RELOAD STAPLE 60 2.6 WHT THN (STAPLE) ×1 IMPLANT
RELOAD STAPLE 60 3.6 BLU REG (STAPLE) ×2 IMPLANT
RELOAD STAPLE 60 3.8 GOLD REG (STAPLE) ×1 IMPLANT
RELOAD STAPLE TA45 3.5 REG BLU (ENDOMECHANICALS) ×2 IMPLANT
RELOAD STAPLER BLUE 60MM (STAPLE) ×2 IMPLANT
RELOAD STAPLER GOLD 60MM (STAPLE) ×1 IMPLANT
RELOAD STAPLER WHITE 60MM (STAPLE) ×1 IMPLANT
RELOAD SUT SNGL STCH ABSRB 2-0 (ENDOMECHANICALS) ×5 IMPLANT
RELOAD SUT SNGL STCH BLK 2-0 (ENDOMECHANICALS) ×5 IMPLANT
SCISSORS LAP 5X45 EPIX DISP (ENDOMECHANICALS) ×2 IMPLANT
SET IRRIG TUBING LAPAROSCOPIC (IRRIGATION / IRRIGATOR) ×2 IMPLANT
SHEARS HARMONIC ACE PLUS 45CM (MISCELLANEOUS) ×2 IMPLANT
SLEEVE ADV FIXATION 12X100MM (TROCAR) ×4 IMPLANT
SOLUTION ANTI FOG 6CC (MISCELLANEOUS) ×2 IMPLANT
STAPLER ECHELON LONG 60 440 (INSTRUMENTS) ×1 IMPLANT
STAPLER RELOAD BLUE 60MM (STAPLE) ×4
STAPLER RELOAD GOLD 60MM (STAPLE) ×2
STAPLER RELOAD WHITE 60MM (STAPLE) ×2
SUT MNCRL AB 4-0 PS2 18 (SUTURE) ×2 IMPLANT
SUT RELOAD ENDO STITCH 2 48X1 (ENDOMECHANICALS) ×6
SUT RELOAD ENDO STITCH 2.0 (ENDOMECHANICALS) ×5
SUT SURGIDAC NAB ES-9 0 48 120 (SUTURE) IMPLANT
SUT VIC AB 2-0 SH 27 (SUTURE)
SUT VIC AB 2-0 SH 27X BRD (SUTURE) IMPLANT
SUTURE RELOAD END STTCH 2 48X1 (ENDOMECHANICALS) ×6 IMPLANT
SUTURE RELOAD ENDO STITCH 2.0 (ENDOMECHANICALS) ×5 IMPLANT
SYR 10ML ECCENTRIC (SYRINGE) ×2 IMPLANT
SYR 20CC LL (SYRINGE) ×4 IMPLANT
SYR 50ML LL SCALE MARK (SYRINGE) ×2 IMPLANT
TIP RIGID 35CM EVICEL (HEMOSTASIS) ×2 IMPLANT
TOWEL OR 17X26 10 PK STRL BLUE (TOWEL DISPOSABLE) ×2 IMPLANT
TOWEL OR NON WOVEN STRL DISP B (DISPOSABLE) ×2 IMPLANT
TROCAR ADV FIXATION 12X100MM (TROCAR) ×2 IMPLANT
TROCAR ADV FIXATION 5X100MM (TROCAR) ×2 IMPLANT
TROCAR BLADELESS OPT 5 100 (ENDOMECHANICALS) ×2 IMPLANT
TROCAR XCEL 12X100 BLDLESS (ENDOMECHANICALS) ×2 IMPLANT
TUBE CALIBRATION LAPBAND (TUBING) IMPLANT
TUBING CONNECTING 10 (TUBING) ×2 IMPLANT
TUBING ENDO SMARTCAP PENTAX (MISCELLANEOUS) ×2 IMPLANT
TUBING INSUF HEATED (TUBING) ×2 IMPLANT

## 2017-12-25 NOTE — Anesthesia Procedure Notes (Signed)
Procedure Name: Intubation Date/Time: 12/25/2017 7:29 AM Performed by: Maxwell Caul, CRNA Pre-anesthesia Checklist: Patient identified, Emergency Drugs available, Suction available and Patient being monitored Patient Re-evaluated:Patient Re-evaluated prior to induction Oxygen Delivery Method: Circle system utilized Preoxygenation: Pre-oxygenation with 100% oxygen Induction Type: IV induction Ventilation: Mask ventilation without difficulty Laryngoscope Size: Mac and 3 Grade View: Grade I Tube type: Oral Tube size: 7.5 mm Number of attempts: 1 Airway Equipment and Method: Stylet and Oral airway Placement Confirmation: ETT inserted through vocal cords under direct vision,  positive ETCO2 and breath sounds checked- equal and bilateral Secured at: 21 cm Tube secured with: Tape Dental Injury: Teeth and Oropharynx as per pre-operative assessment

## 2017-12-25 NOTE — Discharge Instructions (Signed)
° ° ° °GASTRIC BYPASS/SLEEVE ° Home Care Instructions ° ° These instructions are to help you care for yourself when you go home. ° °Call: If you have any problems. °• Call [phone] and ask for the surgeon on call °• If you need immediate help, come to the ER at [facility].  °• Tell the ER staff that you are a new post-op gastric bypass or gastric sleeve patient °  °Signs and symptoms to report: • Severe vomiting or nausea °o If you cannot keep down clear liquids for longer than 1 day, call your surgeon  °• Abdominal pain that does not get better after taking your pain medication °• Fever over 100.4° F with chills °• Heart beating over 100 beats a minute °• Shortness of breath at rest °• Chest pain °•  Redness, swelling, drainage, or foul odor at incision (surgical) sites °•  If your incisions open or pull apart °• Swelling or pain in calf (lower leg) °• Diarrhea (Loose bowel movements that happen often), frequent watery, uncontrolled bowel movements °• Constipation, (no bowel movements for 3 days) if this happens: Pick one °o Milk of Magnesia, 2 tablespoons by mouth, 3 times a day for 2 days if needed °o Stop taking Milk of Magnesia once you have a bowel movement °o Call your doctor if constipation continues °Or °o Miralax  (instead of Milk of Magnesia) following the label instructions °o Stop taking Miralax once you have a bowel movement °o Call your doctor if constipation continues °• Anything you think is not normal °  °Normal side effects after surgery: • Unable to sleep at night or unable to focus °• Irritability or moody °• Being tearful (crying) or depressed °These are common complaints, possibly related to your anesthesia medications that put you to sleep, stress of surgery, and change in lifestyle.  This usually goes away a few weeks after surgery.  If these feelings continue, call your primary care doctor. °  °Wound Care: You may have surgical glue, steri-strips, or staples over your incisions after  surgery °• Surgical glue:  Looks like a clear film over your incisions and will wear off a little at a time °• Steri-strips: Strips of tape over your incisions. You may notice a yellowish color on the skin under the steri-strips. This is used to make the   steri-strips stick better. Do not pull the steri-strips off - let them fall off °• Staples: Staples may be removed before you leave the hospital °o If you go home with staples, call [facility], [phone] at for an appointment with your surgeon’s nurse to have staples removed 10 days after surgery. °• Showering: You may shower two (2) days after your surgery unless your surgeon tells you differently °o Wash gently around incisions with warm soapy water, rinse well, and gently pat dry  °o No tub baths until staples are removed, steri-strips fall off or glue is gone.  °  °Medications: • Medications should be liquid or crushed if larger than the size of a dime °• Extended release pills (medication that release a little bit at a time through the day) should NOT be crushed or cut. (examples include XL, ER, DR, SR) °• Depending on the size and number of medications you take, you may need to space (take a few throughout the day)/change the time you take your medications so that you do not over-fill your pouch (smaller stomach) °• Make sure you follow-up with your primary care doctor to   make medication changes needed during rapid weight loss and life-style changes °• If you have diabetes, follow up with the doctor that orders your diabetes medication(s) within one week after surgery and check your blood sugar regularly. °• Do not drive while taking prescription pain medication  °• It is ok to take Tylenol by the bottle instructions with your pain medicine or instead of your pain medicine as needed.  DO NOT TAKE NSAIDS (EXAMPLES OF NSAIDS:  IBUPROFREN/ NAPROXEN)  °Diet:                    First 2 Weeks ° You will see the dietician t about two (2) weeks  after your surgery. The dietician will increase the types of foods you can eat if you are handling liquids well: °• If you have severe vomiting or nausea and cannot keep down clear liquids lasting longer than 1 day, call your surgeon @ ([phone]) °Protein Shake °• Drink at least 2 ounces of shake 5-6 times per day °• Each serving of protein shakes (usually 8 - 12 ounces) should have: °o 15 grams of protein  °o And no more than 5 grams of carbohydrate  °• Goal for protein each day: °o Men = 80 grams per day °o Women = 60 grams per day °• Protein powder may be added to fluids such as non-fat milk or Lactaid milk or unsweetened Soy/Almond milk (limit to 35 grams added protein powder per serving) ° °Hydration °• Slowly increase the amount of water and other clear liquids as tolerated (See Acceptable Fluids) °• Slowly increase the amount of protein shake as tolerated  °•  Sip fluids slowly and throughout the day.  Do not use straws. °• May use sugar substitutes in small amounts (no more than 6 - 8 packets per day; i.e. Splenda) ° °Fluid Goal °• The first goal is to drink at least 8 ounces of protein shake/drink per day (or as directed by the nutritionist); some examples of protein shakes are Syntrax Nectar, Adkins Advantage, EAS Edge HP, and Unjury. See handout from pre-op Bariatric Education Class: °o Slowly increase the amount of protein shake you drink as tolerated °o You may find it easier to slowly sip shakes throughout the day °o It is important to get your proteins in first °• Your fluid goal is to drink 64 - 100 ounces of fluid daily °o It may take a few weeks to build up to this °• 32 oz (or more) should be clear liquids  °And  °• 32 oz (or more) should be full liquids (see below for examples) °• Liquids should not contain sugar, caffeine, or carbonation ° °Clear Liquids: °• Water or Sugar-free flavored water (i.e. Fruit H2O, Propel) °• Decaffeinated coffee or tea (sugar-free) °• Crystal Lite, Wyler’s Lite,  Minute Maid Lite °• Sugar-free Jell-O °• Bouillon or broth °• Sugar-free Popsicle:   *Less than 20 calories each; Limit 1 per day ° °Full Liquids: °Protein Shakes/Drinks + 2 choices per day of other full liquids °• Full liquids must be: °o No More Than 15 grams of Carbs per serving  °o No More Than 3 grams of Fat per serving °• Strained low-fat cream soup (except Cream of Potato or Tomato) °• Non-Fat milk °• Fat-free Lactaid Milk °• Unsweetened Soy Or Unsweetened Almond Milk °• Low Sugar yogurt (Dannon Lite & Fit, Greek yogurt; Oikos Triple Zero; Chobani Simply 100; Yoplait 100 calorie Greek - No Fruit on the Bottom) ° °  °Vitamins   and Minerals • Start 1 day after surgery unless otherwise directed by your surgeon °• 2 Chewable Bariatric Specific Multivitamin / Multimineral Supplement with iron (Example: Bariatric Advantage Multi EA) °• Chewable Calcium with Vitamin D-3 °(Example: 3 Chewable Calcium Plus 600 with Vitamin D-3) °o Take 500 mg three (3) times a day for a total of 1500 mg each day °o Do not take all 3 doses of calcium at one time as it may cause constipation, and you can only absorb 500 mg  at a time  °o Do not mix multivitamins containing iron with calcium supplements; take 2 hours apart °• Menstruating women and those with a history of anemia (a blood disease that causes weakness) may need extra iron °o Talk with your doctor to see if you need more iron °• Do not stop taking or change any vitamins or minerals until you talk to your dietitian or surgeon °• Your Dietitian and/or surgeon must approve all vitamin and mineral supplements °  °Activity and Exercise: Limit your physical activity as instructed by your doctor.  It is important to continue walking at home.  During this time, use these guidelines: °• Do not lift anything greater than ten (10) pounds for at least two (2) weeks °• Do not go back to work or drive until your surgeon says you can °• You may have sex when you feel comfortable  °o It is  VERY important for female patients to use a reliable birth control method; fertility often increases after surgery  °o All hormonal birth control will be ineffective for 30 days after surgery due to medications given during surgery a barrier method must be used. °o Do not get pregnant for at least 18 months °• Start exercising as soon as your doctor tells you that you can °o Make sure your doctor approves any physical activity °• Start with a simple walking program °• Walk 5-15 minutes each day, 7 days per week.  °• Slowly increase until you are walking 30-45 minutes per day °Consider joining our BELT program. [phone] or email [email or web address].edu °  °Special Instructions Things to remember: °• Use your CPAP when sleeping if this applies to you ° °• [facility] has two free Bariatric Surgery Support Groups that meet monthly °o The 3rd Thursday of each month, 6 pm, [facility]  °o The 2nd Friday of each month, 11:45 am in the private dining room in the basement of [facility] °• It is very important to keep all follow up appointments with your surgeon, dietitian, primary care physician, and behavioral health practitioner °• Routine follow up schedule with your surgeon include appointments at 2-3 weeks, 6-8 weeks, 6 months, and 1 year at a minimum.  Your surgeon may request to see you more often.   °o After the first year, please follow up with your bariatric surgeon and dietitian at least once a year in order to maintain best weight loss results °[facility]: [phone] °[facility]: [phone] °Bariatric Nurse Coordinator: [phone] °  °   Reviewed and Endorsed  °by [facility] Patient Education Committee, June, 2016 °Edits Approved: Aug, 2018 ° ° ° °

## 2017-12-25 NOTE — Progress Notes (Signed)
Discussed post op day goals with patient including ambulation, IS, diet progression, pain, and nausea control.  Questions answered. 

## 2017-12-25 NOTE — Interval H&P Note (Signed)
History and Physical Interval Note:  12/25/2017 7:02 AM  Suzanne Gutierrez  has presented today for surgery, with the diagnosis of Morbid Obesity, GERD, HTN, Arthritis  The various methods of treatment have been discussed with the patient and family. After consideration of risks, benefits and other options for treatment, the patient has consented to  Procedure(s): LAPAROSCOPIC ROUX-EN-Y GASTRIC BYPASS WITH UPPER ENDOSCOPY, ERAS PATHWAY (N/A) as a surgical intervention .  The patient's history has been reviewed, patient examined, no change in status, stable for surgery.  I have reviewed the patient's chart and labs.  Questions were answered to the patient's satisfaction.     Darene Lamer Ilene Witcher

## 2017-12-25 NOTE — Op Note (Signed)
Suzanne Gutierrez [id number] 10/09/1965 12/25/2017  Preoperative diagnosis: roux en Y gastric bypass in progress  Postoperative diagnosis: Same   Procedure: Upper endoscopy   Surgeon: Catalina Antigua B. Hassell Done  M.D., FACS   Anesthesia: Gen.   Indications for procedure: This patient was undergoing a roux en Y gastric bypass by Dr. Excell Seltzer.  Endoscopy performed to look for leaks and bleeding.    Description of procedure: The endoscopy was placed in the mouth and into the oropharynx and under endoscopic vision it was advanced to the esophagogastric junction.  The pouch was insufflated and no bleeding was seen.  The patient is short in stature and the gastrojejunostomy was at 40 cm.  No hiatal hernia was noted.   No bleeding or leaks were detected.  The scope was withdrawn without difficulty.     Matt B. Hassell Done, MD, FACS General, Bariatric, & Minimally Invasive Surgery [facility], [place]

## 2017-12-25 NOTE — Transfer of Care (Signed)
Immediate Anesthesia Transfer of Care Note  Patient: Suzanne Gutierrez  Procedure(s) Performed: LAPAROSCOPIC ROUX-EN-Y GASTRIC BYPASS WITH UPPER ENDOSCOPY, ERAS PATHWAY (N/A Abdomen)  Patient Location: PACU  Anesthesia Type:General  Level of Consciousness: sedated, patient cooperative and responds to stimulation  Airway & Oxygen Therapy: Patient Spontanous Breathing and Patient connected to face mask oxygen  Post-op Assessment: Report given to RN and Post -op Vital signs reviewed and stable  Post vital signs: Reviewed and stable  Last Vitals:  Vitals Value Taken Time  BP 124/86 12/25/2017 10:52 AM  Temp    Pulse 94 12/25/2017 10:54 AM  Resp 15 12/25/2017 10:53 AM  SpO2 93 % 12/25/2017 10:54 AM  Vitals shown include unvalidated device data.  Last Pain:  Vitals:   12/25/17 0550  TempSrc: Oral         Complications: No apparent anesthesia complications

## 2017-12-25 NOTE — Op Note (Signed)
Preop diagnosis: Morbid obesity  Postop diagnosis: Morbid obesity  Body mass index is 40.6 kg/m.  Surgical procedure: Laparoscopic Roux-en-Y gastric bypass  Surgeon: Marland Kitchen T.Renesmee Raine M.D.  Asst.: Johnathan Hausen M.D.  Anesthesia: General  Complications:  None  EBL: Minimal  Drains: None  Disposition: PACU in good condition  Description of procedure: Patient is brought to the operating room and general anesthesia induced. She had received preoperative broad-spectrum IV antibiotics and subcutaneous heparin. The abdomen was widely sterilely prepped and draped. Patient timeout was performed and correct patient and procedure confirmed. Access was obtained with a 12 mm Optiview trocar in the left upper quadrant and pneumoperitoneum established without difficulty. Under direct vision 12 mm trocars were placed laterally in the right upper quadrant, right upper quadrant midclavicular line, and to the left and above the umbilicus for the camera port. A 5 mm trocar was placed laterally in the left upper quadrant.  There were dense omental adhesions to mesh at the umbilicus from previous hernia repair that were taken down with the harmonic scalpel and no evidence of bowel involvement.  A bilateral T AP block was performed with dilute Exparel under direct vision.  The omentum was brought into the upper abdomen and the transverse mesocolon elevated and the ligament of Treitz clearly identified. A 40 cm biliopancreatic limb was then carefully measured from the ligament of Treitz. The small intestine was divided at this point with a single firing of the white load linear stapler. A Penrose drain was sutured to the end of the Roux-en-Y limb for later identification. A 100 cm Roux-en-Y limb was then carefully measured. At this point a side-to-side anastomosis was created between the Roux limb and the end of the biliopancreatic limb. This was accomplished with a single firing of the 45 mm white load linear  stapler. The common enterotomy was closed with a running 2-0 Vicryl begun at either end of the enterotomy and tied centrally. The mesenteric defect was then closed with running 2-0 silk. The omentum was then divided with the harmonic scalpel up towards the transverse colon to allow mobility of the Roux limb toward the gastric pouch. The patient was then placed in steep reversed Trendelenburg. Through a 5 mm subxiphoid site the [facility] retractor was placed and the left lobe of the liver elevated with excellent exposure of the upper stomach and hiatus. The angle of Hiss was then mobilized with the harmonic scalpel. A 4 cm gastric pouch was then carefully measured along the lesser curve of the stomach. Dissection was carried along the lesser curve at this point with the Harmonic scalpel working carefully back toward the lesser sac at right angles to the lesser curve. The free lesser sac was then entered. After being sure all tubes were removed from the stomach an initial firing of the gold load 60 mm linear stapler was fired at right angles across the lesser curve for about 4 cm. The gastric pouch was further mobilized posteriorly and then the pouch was completed with 2 further firings of the 60 mm blue load linear stapler up through the previously dissected angle of His. It was ensured that the pouch was completely mobilized away from the gastric remnant. This created a nice tubular 4-5 cm gastric pouch. The staple line of the gastric remnant was then oversewn with 2-0 silk for hemostasis. The Roux limb was then brought up in an antecolic fashion with the candycane facing to the patient's left without undue tension. The gastrojejunostomy was created with an initial  posterior row of 2-0 Vicryl between the Roux limb and the staple line of the gastric pouch. Enterotomies were then made in the gastric pouch and the Roux limb with the harmonic scalpel and at approximately 2-2-1/2 cm anastomosis was created with a single  firing of the blue load linear stapler. The staple line was inspected and was intact without bleeding. The common enterotomy was then closed with running 2-0 Vicryl begun at either end and tied centrally. The wall tube was then easily passed through the anastomosis and an outer anterior layer of running 2-0 Vicryl was placed. The Ewald tube was removed. With the outlet of the gastrojejunostomy clamped and under saline irrigation the assistant performed upper endoscopy and with the gastric pouch tensely distended with air there was no evidence of leak. The pouch was desufflated. The Terance Hart defect was closed with running 2-0 silk. The abdomen was inspected for any evidence of bleeding or bowel injury and everything looked fine. The Nathanson retractor was removed under direct vision after coating the anastomosis with Tisseel tissue sealant. All CO2 was evacuated and trochars removed. Skin incisions were closed with staples. Sponge needle and instrument counts were correct. The patient was taken to the PACU in good condition.     Edward Jolly MD, FACS  12/25/2017, 10:48 AM

## 2017-12-25 NOTE — Anesthesia Preprocedure Evaluation (Addendum)
Anesthesia Evaluation  Patient identified by MRN, date of birth, ID band Patient awake    Reviewed: Allergy & Precautions, H&P , NPO status , Patient's Chart, lab work & pertinent test results  History of Anesthesia Complications (+) PONV and history of anesthetic complications  Airway Mallampati: II   Neck ROM: full    Dental   Pulmonary neg pulmonary ROS,    breath sounds clear to auscultation       Cardiovascular hypertension, + DOE   Rhythm:regular Rate:Normal     Neuro/Psych  Headaches, PSYCHIATRIC DISORDERS Bipolar Disorder    GI/Hepatic GERD  ,  Endo/Other  diabetes, Type 2Morbid obesity  Renal/GU      Musculoskeletal  (+) Arthritis ,   Abdominal   Peds  Hematology   Anesthesia Other Findings   Reproductive/Obstetrics                            Anesthesia Physical Anesthesia Plan  ASA: II  Anesthesia Plan: General   Post-op Pain Management:    Induction: Intravenous  PONV Risk Score and Plan: 4 or greater and Ondansetron, Dexamethasone, Midazolam, Scopolamine patch - Pre-op and Treatment may vary due to age or medical condition  Airway Management Planned: Oral ETT  Additional Equipment:   Intra-op Plan:   Post-operative Plan: Extubation in OR  Informed Consent: I have reviewed the patients History and Physical, chart, labs and discussed the procedure including the risks, benefits and alternatives for the proposed anesthesia with the patient or authorized representative who has indicated his/her understanding and acceptance.     Plan Discussed with: CRNA, Anesthesiologist and Surgeon  Anesthesia Plan Comments:         Anesthesia Quick Evaluation

## 2017-12-26 ENCOUNTER — Encounter (HOSPITAL_COMMUNITY): Payer: Self-pay | Admitting: General Surgery

## 2017-12-26 LAB — CBC WITH DIFFERENTIAL/PLATELET
BASOS PCT: 0 %
Basophils Absolute: 0 10*3/uL (ref 0.0–0.1)
EOS PCT: 0 %
Eosinophils Absolute: 0 10*3/uL (ref 0.0–0.7)
HCT: 32.5 % — ABNORMAL LOW (ref 36.0–46.0)
Hemoglobin: 10.8 g/dL — ABNORMAL LOW (ref 12.0–15.0)
Lymphocytes Relative: 10 %
Lymphs Abs: 0.9 10*3/uL (ref 0.7–4.0)
MCH: 31.2 pg (ref 26.0–34.0)
MCHC: 33.2 g/dL (ref 30.0–36.0)
MCV: 93.9 fL (ref 78.0–100.0)
MONO ABS: 0.6 10*3/uL (ref 0.1–1.0)
MONOS PCT: 7 %
Neutro Abs: 7.5 10*3/uL (ref 1.7–7.7)
Neutrophils Relative %: 83 %
Platelets: 282 10*3/uL (ref 150–400)
RBC: 3.46 MIL/uL — ABNORMAL LOW (ref 3.87–5.11)
RDW: 13.1 % (ref 11.5–15.5)
WBC: 9 10*3/uL (ref 4.0–10.5)

## 2017-12-26 LAB — GLUCOSE, CAPILLARY
GLUCOSE-CAPILLARY: 107 mg/dL — AB (ref 70–99)
Glucose-Capillary: 137 mg/dL — ABNORMAL HIGH (ref 70–99)

## 2017-12-26 MED ORDER — OXYCODONE HCL 5 MG/5ML PO SOLN: 5.0000 mg | ORAL | 0 refills | Status: DC | PRN

## 2017-12-26 NOTE — Progress Notes (Signed)
Patient alert and oriented, pain is controlled. Patient is tolerating fluids, advanced to protein shake today, patient is tolerating well. Reviewed Gastric Bypass discharge instructions with patient and patient is able to articulate understanding. Provided information on BELT program, Support Group and [facility] outpatient pharmacy. All questions answered, will continue to monitor.   Total fluid intake 920 Per dehydration protocol call back one day post op

## 2017-12-26 NOTE — Progress Notes (Signed)
Patient educated and started on protein drink, patient shows understanding and questions has been answered. We will continue to monitor.

## 2017-12-26 NOTE — Plan of Care (Signed)

## 2017-12-26 NOTE — Progress Notes (Signed)
Patient alert and oriented, Post op day 1.  Provided support and encouragement.  Encouraged pulmonary toilet, ambulation and small sips of liquids.  Completed 12 ounces of bari clear fluid and 5 ounces of protein shake.  All questions answered.  Will continue to monitor.

## 2017-12-26 NOTE — Discharge Summary (Signed)
  Patient ID: Suzanne Gutierrez [id number] 51 y.o. 1965-09-19  12/25/2017  Discharge date and time: 12/26/2017   Admitting Physician: Edward Jolly  Discharge Physician: Edward Jolly  Admission Diagnoses: Morbid Obesity, GERD, HTN, Arthritis  Discharge Diagnoses: Same  Operations: Procedure(s): LAPAROSCOPIC ROUX-EN-Y GASTRIC BYPASS WITH UPPER ENDOSCOPY, ERAS PATHWAY  Admission Condition: good  Discharged Condition: good  Indication for Admission: Patient with progressive morbid obesity unresponsive to multiple efforts at medical management who presents with a BMI of 41.4 and comorbidities of hypertension and significant GERD.  Following extensive preoperative evaluation and discussion detailed elsewhere she is electively admitted following laparoscopic Roux-en-Y gastric bypass for her morbid obesity.    Hospital Course: On the morning of admission she underwent an uneventful laparoscopic Roux-en-Y gastric bypass.  Her postoperative course was uncomplicated.  The following morning vital signs are stable.  She generally feels well.  She has had some nausea this morning and discharge is planned later today if this resolves.  Slight bruising around her incisions and mild drop in hemoglobin from 12-10.8.  No evidence of complications.  Her abdomen is benign.    Disposition: Home  Patient Instructions:  Allergies as of 12/26/2017      Reactions   Codeine Nausea And Vomiting      Medication List    STOP taking these medications   ibuprofen 600 MG tablet Commonly known as:  ADVIL,MOTRIN     TAKE these medications   atorvastatin 10 MG tablet Commonly known as:  LIPITOR Take 5 mg by mouth at bedtime.   clobetasol cream 0.05 % Commonly known as:  TEMOVATE Apply 1 application topically at bedtime. Apply to vulva for lichen sclerosus   escitalopram 10 MG tablet Commonly known as:  LEXAPRO Take 10 mg by mouth at bedtime.   hydrOXYzine 25 MG tablet Commonly  known as:  ATARAX/VISTARIL Take 25 mg by mouth at bedtime.   lidocaine 5 % ointment Commonly known as:  XYLOCAINE Apply 1 application topically daily as needed for mild pain. For numbing an area   metoprolol succinate 50 MG 24 hr tablet Commonly known as:  TOPROL-XL Take 50 mg by mouth daily. Take with or immediately following a meal. Notes to patient:  Monitor Blood Pressure Daily and keep a log for primary care physician.  You may need to make changes to your medications with rapid weight loss.     multivitamin with minerals Tabs tablet Take 1 tablet by mouth daily. Chewable   omeprazole 20 MG capsule Commonly known as:  PRILOSEC Take 2 capsules (40 mg total) by mouth daily. What changed:  how much to take   oxyCODONE 5 MG/5ML solution Commonly known as:  ROXICODONE Take 5-10 mLs (5-10 mg total) by mouth every 4 (four) hours as needed for moderate pain or severe pain.   rizatriptan 10 MG tablet Commonly known as:  MAXALT Take 10 mg by mouth every 2 (two) hours as needed for migraine.   zolpidem 10 MG tablet Commonly known as:  AMBIEN Take 10 mg by mouth at bedtime.       Activity: activity as tolerated Diet: Bariatric protein shakes Wound Care: none needed  Follow-up:  With Dr. Excell Seltzer in 3 weeks.  Signed: Edward Jolly MD, FACS  12/26/2017, 9:58 AM

## 2017-12-26 NOTE — Anesthesia Postprocedure Evaluation (Signed)
Anesthesia Post Note  Patient: Suzanne Gutierrez  Procedure(s) Performed: LAPAROSCOPIC ROUX-EN-Y GASTRIC BYPASS WITH UPPER ENDOSCOPY, ERAS PATHWAY (N/A Abdomen)     Patient location during evaluation: PACU Anesthesia Type: General Level of consciousness: awake and alert Pain management: pain level controlled Vital Signs Assessment: post-procedure vital signs reviewed and stable Respiratory status: spontaneous breathing, nonlabored ventilation, respiratory function stable and patient connected to nasal cannula oxygen Cardiovascular status: blood pressure returned to baseline and stable Postop Assessment: no apparent nausea or vomiting Anesthetic complications: no    Last Vitals:  Vitals:   12/26/17 0436 12/26/17 0949  BP: 106/72 119/74  Pulse: 79 77  Resp: 16 17  Temp: 36.5 C 37.1 C  SpO2: 94% 97%    Last Pain:  Vitals:   12/26/17 0949  TempSrc: Oral  PainSc:                  Laasia Arcos S

## 2017-12-26 NOTE — Progress Notes (Signed)
Discharge instructions discussed with patient and family, verbalized agreement and understanding, 

## 2017-12-26 NOTE — Progress Notes (Signed)
Patient ID: Suzanne Gutierrez, female   DOB: 1965/06/22, 52 y.o.   MRN: [id number] 1 Day Post-Op   Subjective: She had some nausea this morning after protein shakes.  Had been tolerating fluids well.  No vomiting.  Some left shoulder pain but denies abdominal pain.  Has been up walking frequently.  Objective: Vital signs in last 24 hours: Temp:  [97.5 F (36.4 C)-98.8 F (37.1 C)] 98.7 F (37.1 C) (07/30 0949) Pulse Rate:  [60-90] 77 (07/30 0949) Resp:  [13-25] 17 (07/30 0949) BP: (106-136)/(72-89) 119/74 (07/30 0949) SpO2:  [94 %-99 %] 97 % (07/30 0949) Weight:  [103.6 kg (228 lb 4.8 oz)] 103.6 kg (228 lb 4.8 oz) (07/30 3159) Last BM Date: 12/24/17  Intake/Output from previous day: 07/29 0701 - 07/30 0700 In: 4261.7 [P.O.:720; I.V.:3393.3; IV Piggyback:148.3] Out: 2675 [Urine:2625; Blood:50] Intake/Output this shift: Total I/O In: 180 [P.O.:180] Out: -   General appearance: alert, cooperative and no distress GI: normal findings: soft, non-tender Incision/Wound: Some bruising around some incisions but no erythema or drainage  Lab Results:  Recent Labs    12/25/17 1438 12/26/17 0407  WBC  --  9.0  HGB 12.2 10.8*  HCT 36.2 32.5*  PLT  --  282   BMET No results for input(s): NA, K, CL, CO2, GLUCOSE, BUN, CREATININE, CALCIUM in the last 72 hours.   Studies/Results: No results found.  Anti-infectives: Anti-infectives (From admission, onward)   Start     Dose/Rate Route Frequency Ordered Stop   12/25/17 0600  cefoTEtan (CEFOTAN) 2 g in sodium chloride 0.9 % 100 mL IVPB     2 g 200 mL/hr over 30 Minutes Intravenous On call to O.R. 12/25/17 0544 12/25/17 0747      Assessment/Plan: s/p Procedure(s): LAPAROSCOPIC ROUX-EN-Y GASTRIC BYPASS WITH UPPER ENDOSCOPY, ERAS PATHWAY Generally doing well postoperatively.  Some nausea this morning but had been taking fluids well.  Abdomen is benign.  Bruising abdominal wall and some slight drop in hemoglobin but I do not see  evidence of significant bleeding.  She would like to go home today which I think would be fine if her nausea resolves and she is taking fluid well later this afternoon.   LOS: 1 day    Edward Jolly 12/26/2017

## 2018-01-01 ENCOUNTER — Telehealth (HOSPITAL_COMMUNITY): Payer: Self-pay

## 2018-01-01 NOTE — Telephone Encounter (Signed)
Patient called to discuss post bariatric surgery follow up questions.  See below:   1.  Tell me about your pain and pain management?sore but no real pain, has not needed medication  2.  Let's talk about fluid intake.  How much total fluid are you taking in?58 ounceso fluid (22ounces from protein shake, 36 ounces of water)  3.  How much protein have you taken in the last 2 days?60 grams  4.  Have you had nausea?  Tell me about when have experienced nausea and what you did to help?no nausea  5.  Has the frequency or color changed with your urine?urinating frequently, light in color  6.  Tell me what your incisions look like?abd is bruised but incisions are healing no problems  7.  Have you been passing gas? BM?passing gas had bm, discussed constipation and reminded on discharge instructions options for constipation   8.  If a problem or question were to arise who would you call?  Do you know contact numbers for [facility], [facility], and [facility]?aware of how to contact services  9.  How has the walking going?walking regularly  10.  How are your vitamins and calcium going?  How are you taking them?using celebrate following vitamin schedule as directed  Discussed follow up appointments at [facility].  No further questions.

## 2018-01-09 ENCOUNTER — Encounter: Payer: 59 | Attending: General Surgery | Admitting: Skilled Nursing Facility1

## 2018-01-09 DIAGNOSIS — Z713 Dietary counseling and surveillance: Secondary | ICD-10-CM | POA: Diagnosis not present

## 2018-01-09 DIAGNOSIS — E119 Type 2 diabetes mellitus without complications: Secondary | ICD-10-CM

## 2018-01-10 ENCOUNTER — Encounter: Payer: Self-pay | Admitting: Skilled Nursing Facility1

## 2018-01-10 NOTE — Progress Notes (Signed)
Bariatric Class:  Appt start time: 1530 end time:  1630.  2 Week Post-Operative Nutrition Class  Patient was seen on 01/09/2018 for Post-Operative Nutrition education at [facility].   Surgery date: 12/25/2017 Surgery type: RYGB Start weight at [facility]: 225.1 Weight today: 210  TANITA  BODY COMP RESULTS  01/09/2018   BMI (kg/m^2) 38.4   Fat Mass (lbs) 100.6   Fat Free Mass (lbs) 109.4   Total Body Water (lbs) 78.8   The following the learning objectives were met by the patient during this course:  Identifies Phase 3A (Soft, High Proteins) Dietary Goals and will begin from 2 weeks post-operatively to 2 months post-operatively  Identifies appropriate sources of fluids and proteins   States protein recommendations and appropriate sources post-operatively  Identifies the need for appropriate texture modifications, mastication, and bite sizes when consuming solids  Identifies appropriate multivitamin and calcium sources post-operatively  Describes the need for physical activity post-operatively and will follow MD recommendations  States when to call healthcare provider regarding medication questions or post-operative complications  Handouts given during class include:  Phase 3A: Soft, High Protein Diet Handout  Follow-Up Plan: Patient will follow-up at [facility] in 6 weeks for 2 month post-op nutrition visit for diet advancement per MD.

## 2018-01-12 ENCOUNTER — Telehealth: Payer: Self-pay | Admitting: Registered"

## 2018-01-12 NOTE — Telephone Encounter (Signed)
RD called pt to verify fluid intake once starting soft, solid proteins 2 week post-bariatric surgery.   Daily Fluid intake: ~45 oz Daily Protein intake: 60 g  Concerns/issues: Pt states she is working on increasing fluid intake. Pt states she tracks fluid and protein. Pt was encouraged with strategies to increase fluid intake. RD will follow-up with pt next week.

## 2018-01-15 ENCOUNTER — Telehealth: Payer: Self-pay | Admitting: Registered"

## 2018-01-15 NOTE — Telephone Encounter (Signed)
RD returned pt's phone call. Pt states she has been nauseated, sick to stomach, and weak for the past 3 days. Pt states she is struggling with protein. Pt states she is consuming 45-55 oz of fluid. Pt states she vomited twice yesterday, taking zofran every 6 hours. Pt states she contacted surgeon's office and going to doctor's office tomorrow for blood work.

## 2018-01-16 ENCOUNTER — Other Ambulatory Visit: Payer: Self-pay | Admitting: Student

## 2018-01-17 ENCOUNTER — Ambulatory Visit (HOSPITAL_COMMUNITY)
Admission: RE | Admit: 2018-01-17 | Discharge: 2018-01-17 | Disposition: A | Discharge status: Home / Self Care | Payer: 59 | Source: Ambulatory Visit | Attending: Student | Admitting: Student

## 2018-01-17 DIAGNOSIS — L900 Lichen sclerosus et atrophicus: Secondary | ICD-10-CM | POA: Diagnosis present

## 2018-01-17 MED ORDER — SODIUM CHLORIDE 0.9 % IV SOLN: INTRAVENOUS | Status: DC

## 2018-01-17 MED ORDER — SODIUM CHLORIDE 0.9 % IV BOLUS
1000.0000 mL | Freq: Once | INTRAVENOUS | Status: AC
  Administered 2018-01-17: 1000 mL via INTRAVENOUS

## 2018-01-17 MED ORDER — THIAMINE HCL 100 MG/ML IJ SOLN
INTRAVENOUS | Status: DC
  Administered 2018-01-17: 10:00:00 via INTRAVENOUS
  Filled 2018-01-17 (×4): qty 1000

## 2018-01-17 NOTE — Discharge Instructions (Signed)
Today, you received sodium chloride 0.9 % 1,000 mL with thiamine 426 mg, folic acid 1 mg, multivitamins adult 10 mL infusion followed by an infusion of 1 liter of normal saline per physician order.

## 2018-01-17 NOTE — Progress Notes (Addendum)
Spoke with Abigail Butts, RN at [facility] regarding orders for pt infusion later this week; states that pt to have an infusion of  Sodium chloride with thiamine, folic acid, and multivitamins and bolus of normal saline one more time this week per note from Glenna Durand, PA; states will fax orders today

## 2018-01-17 NOTE — Progress Notes (Signed)
Pt arrived for IV infusion of normal saline with mulltivitamins, folic acid, and thiamine, and 1 liter of normal saline; infusion completed with no complications noted; pt alert, oriented, and ambulatory upon discharge;  Ordering Provider: Glenna Durand, PA

## 2018-01-19 ENCOUNTER — Ambulatory Visit (HOSPITAL_COMMUNITY)
Admission: RE | Admit: 2018-01-19 | Discharge: 2018-01-19 | Disposition: A | Discharge status: Home / Self Care | Payer: 59 | Source: Ambulatory Visit | Attending: Student | Admitting: Student

## 2018-01-19 DIAGNOSIS — L900 Lichen sclerosus et atrophicus: Secondary | ICD-10-CM | POA: Diagnosis not present

## 2018-01-19 MED ORDER — THIAMINE HCL 100 MG/ML IJ SOLN
Freq: Once | INTRAVENOUS | Status: AC
  Administered 2018-01-19: 09:00:00 via INTRAVENOUS
  Filled 2018-01-19: qty 1000

## 2018-01-19 MED ORDER — SODIUM CHLORIDE 0.9 % IV BOLUS
1000.0000 mL | Freq: Once | INTRAVENOUS | Status: AC
  Administered 2018-01-19: 1000 mL via INTRAVENOUS

## 2018-01-19 NOTE — Progress Notes (Signed)
PATIENT CARE CENTER NOTE  Provider: Dr. Glenna Durand   Procedure: 1 Liter Banana Bag and 1 Liter fluid bolus   Note: Patient received infusion of normal saline with multivitamins, folic acid, and thiamine and a 1 liter bolus of normal saline. Patient tolerated infusions well. Vital signs stable. Discharge instructions given. Patient alert, oriented and ambulatory at discharge.

## 2018-01-19 NOTE — Discharge Instructions (Signed)
Today you received a 1 Liter Banana Bag (Thiamine, Folic Acid, and multivitamins) and a 1 Liter bolus of 0.9% Normal Saline fluids.      Dehydration, Adult Dehydration is when there is not enough fluid or water in your body. This happens when you lose more fluids than you take in. Dehydration can range from mild to very bad. It should be treated right away to keep it from getting very bad. Symptoms of mild dehydration may include:  Thirst.  Dry lips.  Slightly dry mouth.  Dry, warm skin.  Dizziness. Symptoms of moderate dehydration may include:  Very dry mouth.  Muscle cramps.  Dark pee (urine). Pee may be the color of tea.  Your body making less pee.  Your eyes making fewer tears.  Heartbeat that is uneven or faster than normal (palpitations).  Headache.  Light-headedness, especially when you stand up from sitting.  Fainting (syncope). Symptoms of very bad dehydration may include:  Changes in skin, such as: ? Cold and clammy skin. ? Blotchy (mottled) or pale skin. ? Skin that does not quickly return to normal after being lightly pinched and let go (poor skin turgor).  Changes in body fluids, such as: ? Feeling very thirsty. ? Your eyes making fewer tears. ? Not sweating when body temperature is high, such as in hot weather. ? Your body making very little pee.  Changes in vital signs, such as: ? Weak pulse. ? Pulse that is more than 100 beats a minute when you are sitting still. ? Fast breathing. ? Low blood pressure.  Other changes, such as: ? Sunken eyes. ? Cold hands and feet. ? Confusion. ? Lack of energy (lethargy). ? Trouble waking up from sleep. ? Short-term weight loss. ? Unconsciousness. Follow these instructions at home:  If told by your doctor, drink an ORS: ? Make an ORS by using instructions on the package. ? Start by drinking small amounts, about  cup (120 mL) every 5-10 minutes. ? Slowly drink more until you have had the amount that  your doctor said to have.  Drink enough clear fluid to keep your pee clear or pale yellow. If you were told to drink an ORS, finish the ORS first, then start slowly drinking clear fluids. Drink fluids such as: ? Water. Do not drink only water by itself. Doing that can make the salt (sodium) level in your body get too low (hyponatremia). ? Ice chips. ? Fruit juice that you have added water to (diluted). ? Low-calorie sports drinks.  Avoid: ? Alcohol. ? Drinks that have a lot of sugar. These include high-calorie sports drinks, fruit juice that does not have water added, and soda. ? Caffeine. ? Foods that are greasy or have a lot of fat or sugar.  Take over-the-counter and prescription medicines only as told by your doctor.  Do not take salt tablets. Doing that can make the salt level in your body get too high (hypernatremia).  Eat foods that have minerals (electrolytes). Examples include bananas, oranges, potatoes, tomatoes, and spinach.  Keep all follow-up visits as told by your doctor. This is important. Contact a doctor if:  You have belly (abdominal) pain that: ? Gets worse. ? Stays in one area (localizes).  You have a rash.  You have a stiff neck.  You get angry or annoyed more easily than normal (irritability).  You are more sleepy than normal.  You have a harder time waking up than normal.  You feel: ? Weak. ? Dizzy. ?  Very thirsty.  You have peed (urinated) only a small amount of very dark pee during 6-8 hours. Get help right away if:  You have symptoms of very bad dehydration.  You cannot drink fluids without throwing up (vomiting).  Your symptoms get worse with treatment.  You have a fever.  You have a very bad headache.  You are throwing up or having watery poop (diarrhea) and it: ? Gets worse. ? Does not go away.  You have blood or something green (bile) in your throw-up.  You have blood in your poop (stool). This may cause poop to look black and  tarry.  You have not peed in 6-8 hours.  You pass out (faint).  Your heart rate when you are sitting still is more than 100 beats a minute.  You have trouble breathing. This information is not intended to replace advice given to you by your health care provider. Make sure you discuss any questions you have with your health care provider. Document Released: 03/12/2009 Document Revised: 12/04/2015 Document Reviewed: 07/10/2015 Elsevier Interactive Patient Education  2018 [employer].

## 2018-01-23 DIAGNOSIS — I10 Essential (primary) hypertension: Secondary | ICD-10-CM | POA: Diagnosis not present

## 2018-01-23 DIAGNOSIS — Z9884 Bariatric surgery status: Secondary | ICD-10-CM | POA: Diagnosis not present

## 2018-01-23 DIAGNOSIS — I700 Atherosclerosis of aorta: Secondary | ICD-10-CM | POA: Diagnosis not present

## 2018-02-16 ENCOUNTER — Encounter: Payer: 59 | Attending: General Surgery | Admitting: Registered"

## 2018-02-16 ENCOUNTER — Encounter: Payer: Self-pay | Admitting: Registered"

## 2018-02-16 DIAGNOSIS — Z713 Dietary counseling and surveillance: Secondary | ICD-10-CM | POA: Diagnosis not present

## 2018-02-16 DIAGNOSIS — E119 Type 2 diabetes mellitus without complications: Secondary | ICD-10-CM

## 2018-02-16 NOTE — Patient Instructions (Addendum)
Goals:  Follow Phase 3B: High Protein + Non-Starchy Vegetables  Eat 3-6 small meals/snacks, every 3-5 hrs  Increase lean protein foods to meet 60g goal  Increase fluid intake to 64oz +  Avoid drinking 15 minutes before, during and 30 minutes after eating  Aim for >30 min of physical activity daily  Crush multivitamin in yogurt.   Take multivitamin during the day instead of in the morning  Aim to keep fat and sugar in single digits per serving on nutrition facts label

## 2018-02-16 NOTE — Progress Notes (Signed)
Follow-up visit:  8 Weeks Post-Operative RYGB Surgery  Medical Nutrition Therapy:  Appt start time: 9:15 end time:  10:00.  Primary concerns today: Post-operative Bariatric Surgery Nutrition Management.  Non scale victories: reduction medications, more energy,no longer craves sugar, more motivated to get up and go, positive mentality, feels better, more active  Surgery date: 12/25/2017 Surgery type: RYGB Start weight at [facility]: 225.1 Weight today: 193.6 Weight change: 16.4 lbs from 210 (01/09/2018) Total weight lost: 31.5 lbs Weight loss goal: feel better, be more active   TANITA  BODY COMP RESULTS  01/09/2018 02/16/2018   BMI (kg/m^2) 38.4 35.4   Fat Mass (lbs) 100.6 88.8   Fat Free Mass (lbs) 109.4 104.8   Total Body Water (lbs) 78.8 75.0    Pt states she has been doing well. Pt states she went to receive fluids once due to dehydration. Pt states she has been doing well staying hydrated since then. Pt states she is a big water drinker. Pt states she adds protein powder to coffee in the morning. Pt states she is not a "big fish eater" but has been increasing her intake of fish.    Preferred Learning Style:   No preference indicated   Learning Readiness:   Ready  Change in progress  24-hr recall: B (AM): protein powder (25g) with coffee Snk (10 AM): cheese (7g) or sugar-free jello or protein shake (30g)  L (1-2 PM): greek yogurt (12-15g) or bacon, [place], cheese roll ups or chili beans or 1/2 burger patty Snk (PM): none  D (PM): 2 oz chicken or beef (14g) Snk (PM): sugar-free fudge popsicles  Fluid intake: decaf coffee (sugar-free coffee, 10 oz), water (32 ounces x2), diet lemonade; 64 ounces Estimated total protein intake: 60+ grams  Medications: See list Supplementation: Celebrate + 3 calcium supplements  CBG monitoring: no Average CBG per patient: none Last patient reported A1c: 5.7  Using straws: occasionally  Drinking while eating: sometimes a sip to clear  palate Having you been chewing well: yes Chewing/swallowing difficulties: sometimes due to history of esophagus  Changes in vision: no Changes to mood/headaches: no Hair loss/Cahnges to skin/Changes to nails: yes, no, no Any difficulty focusing or concentrating: no Sweating: no Dizziness/Lightheaded: often Palpitations: no  Carbonated beverages: no N/V/D/C/GAS: in the mornings improving daily, no, no, yes, no Abdominal Pain: no Dumping syndrome: no Last Lap-Band fill: N/A  Recent physical activity:  Stationary bike 10 min, 3x/week, walking with family 48 min+ 2x/week  Progress Towards Goal(s):  In progress.  Handouts given during visit include:  Phase IV: High Protein + NS vegetables   Nutritional Diagnosis:  [place]-3.3 Overweight/obesity related to past poor dietary habits and physical inactivity as evidenced by patient w/ recent RYGB surgery following dietary guidelines for continued weight loss.     Intervention:  Nutrition education and counseling. Pt was educated and counseled on the next phase of the diet. Pt was in agreement with goals listed.  Goals:  Follow Phase 3B: High Protein + Non-Starchy Vegetables  Eat 3-6 small meals/snacks, every 3-5 hrs  Increase lean protein foods to meet 60g goal  Increase fluid intake to 64oz +  Avoid drinking 15 minutes before, during and 30 minutes after eating  Aim for >30 min of physical activity daily  Crush multivitamin in yogurt.   Take multivitamin during the day instead of in the morning  Aim to keep fat and sugar in single digits per serving on nutrition facts label  Teaching Method Utilized:  Visual Auditory  Hands on  Barriers to learning/adherence to lifestyle change: none identified  Demonstrated degree of understanding via:  Teach Back   Monitoring/Evaluation:  Dietary intake, exercise, lap band fills, and body weight. Follow up in 4 months for 6 month post-op visit.

## 2018-03-15 DIAGNOSIS — Z23 Encounter for immunization: Secondary | ICD-10-CM | POA: Diagnosis not present

## 2018-03-15 DIAGNOSIS — G479 Sleep disorder, unspecified: Secondary | ICD-10-CM | POA: Diagnosis not present

## 2018-03-22 DIAGNOSIS — Z9884 Bariatric surgery status: Secondary | ICD-10-CM | POA: Diagnosis not present

## 2018-03-27 ENCOUNTER — Other Ambulatory Visit: Payer: Self-pay | Admitting: Sports Medicine

## 2018-03-27 ENCOUNTER — Ambulatory Visit (INDEPENDENT_AMBULATORY_CARE_PROVIDER_SITE_OTHER): Payer: 59

## 2018-03-27 ENCOUNTER — Encounter: Payer: Self-pay | Admitting: Sports Medicine

## 2018-03-27 ENCOUNTER — Ambulatory Visit (INDEPENDENT_AMBULATORY_CARE_PROVIDER_SITE_OTHER): Payer: 59 | Admitting: Sports Medicine

## 2018-03-27 DIAGNOSIS — S99921A Unspecified injury of right foot, initial encounter: Secondary | ICD-10-CM

## 2018-03-27 DIAGNOSIS — E119 Type 2 diabetes mellitus without complications: Secondary | ICD-10-CM | POA: Diagnosis not present

## 2018-03-27 DIAGNOSIS — S93601A Unspecified sprain of right foot, initial encounter: Secondary | ICD-10-CM | POA: Diagnosis not present

## 2018-03-27 DIAGNOSIS — G43909 Migraine, unspecified, not intractable, without status migrainosus: Secondary | ICD-10-CM | POA: Insufficient documentation

## 2018-03-27 NOTE — Progress Notes (Signed)
Subjective: Suzanne Gutierrez is a 52 y.o. female patient who presents to office for evaluation of right foot pain. Patient complains of progressive pain after a trip and fall on yesterday on a concrete where she turned in her foot and hurt the lateral of her foot and states that pain goes all the way into her little toe and onto her heel and ankle states she has a history of a bunion surgery that was done by Dr. Milinda Pointer 2 years ago and wants to have the area checked just to make sure she did not injure anything at her surgical site.  Patient reports that since her fall yesterday has used Tylenol elevation in her surgical stockings states that she could barely walk but it feels a little better today since she has taken Tylenol and wore her surgical stocking. Denies any other constitutional symptoms or pedal complaints at this time. Patient Active Problem List   Diagnosis Date Noted  . Migraine 03/27/2018  . Morbid obesity with BMI of 40.0-44.9, adult ([place]) 12/25/2017  . Costochondritis 08/08/2017  . Diabetes mellitus without complication ([place]) 85/63/1497  . Essential hypertension 07/24/2017  . Left-sided chest wall pain 07/21/2017  . DOE (dyspnea on exertion) 07/21/2017  . Chest pain 07/21/2017  . Abnormal finding on EKG 07/21/2017  . Hemorrhoids 06/07/2017  . Seasonal allergies 04/09/2011    Current Outpatient Medications on File Prior to Visit  Medication Sig Dispense Refill  . fluconazole (DIFLUCAN) 150 MG tablet Take 1 pill today, repeat in 3 days    . albuterol (VENTOLIN HFA) 108 (90 Base) MCG/ACT inhaler Ventolin HFA 90 mcg/actuation aerosol inhaler    . ALPRAZolam (XANAX) 0.25 MG tablet TAKE 1 TABLET BY MOUTH 3 TIMES A DAY AS NEEDED FOR ANXIETY  0  . amoxicillin (AMOXIL) 500 MG capsule TAKE 2 CAPSULES BY MOUTH NOW THEN 1 CAP EVERY 6 HOURS UNTIL FINISHED  0  . atorvastatin (LIPITOR) 10 MG tablet Take 5 mg by mouth at bedtime.  2  . clobetasol cream (TEMOVATE) 0.26 % Apply 1 application  topically at bedtime. Apply to vulva for lichen sclerosus    . cyclobenzaprine (FLEXERIL) 5 MG tablet cyclobenzaprine 5 mg tablet    . Desoximetasone 0.05 % OINT desoximetasone 0.05 % topical ointment    . escitalopram (LEXAPRO) 10 MG tablet Take 10 mg by mouth at bedtime.     . gabapentin (NEURONTIN) 100 MG capsule gabapentin 100 mg capsule    . HYDROcodone-homatropine (HYDROMET) 5-1.5 MG/5ML syrup Hydromet 5 mg-1.5 mg/5 mL oral syrup    . hydrOXYzine (ATARAX/VISTARIL) 25 MG tablet Take 25 mg by mouth at bedtime.    . indomethacin (INDOCIN) 25 MG capsule indomethacin 25 mg capsule  TAKE 2 CAPSULES BY MOUTH EVERY 12 HOURS    . lidocaine (XYLOCAINE) 5 % ointment Apply 1 application topically daily as needed for mild pain. For numbing an area    . meloxicam (MOBIC) 15 MG tablet meloxicam 15 mg tablet  take 1 tablet by mouth once daily    . metoprolol succinate (TOPROL-XL) 50 MG 24 hr tablet Take 50 mg by mouth daily. Take with or immediately following a meal.    . metroNIDAZOLE (FLAGYL) 500 MG tablet metronidazole 500 mg tablet  take 2 tablets by mouth EVERY WEEK    . metroNIDAZOLE (METROGEL) 0.75 % vaginal gel metronidazole 0.75 % vaginal gel    . Multiple Vitamin (MULTIVITAMIN WITH MINERALS) TABS tablet Take 1 tablet by mouth daily. Chewable    . omeprazole (  PRILOSEC) 20 MG capsule Take 2 capsules (40 mg total) by mouth daily. (Patient taking differently: Take 20 mg by mouth daily. ) 60 capsule 0  . ondansetron (ZOFRAN-ODT) 4 MG disintegrating tablet ondansetron 4 mg disintegrating tablet    . oseltamivir (TAMIFLU) 75 MG capsule Tamiflu 75 mg capsule    . oxybutynin (DITROPAN-XL) 10 MG 24 hr tablet oxybutynin chloride ER 10 mg tablet,extended release 24 hr    . oxyCODONE (ROXICODONE) 5 MG/5ML solution Take 5-10 mLs (5-10 mg total) by mouth every 4 (four) hours as needed for moderate pain or severe pain.  0  . predniSONE (STERAPRED UNI-PAK 21 TAB) 5 MG (21) TBPK tablet FOLLOWO ENCLOSED  INSTRUCTIONS  0  . rizatriptan (MAXALT) 10 MG tablet Take 10 mg by mouth every 2 (two) hours as needed for migraine.  0  . traMADol (ULTRAM) 50 MG tablet Take 50 mg by mouth every 6 (six) hours as needed.  0  . triamcinolone cream (KENALOG) 0.5 % triamcinolone acetonide 0.5 % topical cream    . valACYclovir (VALTREX) 500 MG tablet TAKE 4 TABLETS BY MOUTH NOW THEN ANOTHER 4 TABLETS BY MOUTH IN 12 HRS  11  . zolpidem (AMBIEN) 10 MG tablet Take 10 mg by mouth at bedtime.      No current facility-administered medications on file prior to visit.     Allergies  Allergen Reactions  . Codeine Nausea And Vomiting    Objective:  General: Alert and oriented x3 in no acute distress  Dermatology: Old surgical scar well-healed on right, no open lesions bilateral lower extremities, no webspace macerations, faint ecchymosis at right dorsal lateral foot and ankle, all nails x 10 are well manicured.  Vascular: Dorsalis Pedis and Posterior Tibial pedal pulses palpable, Capillary Fill Time 3 seconds,(+) pedal hair growth bilateral, no edema bilateral lower extremities, Temperature gradient within normal limits.  Neurology: Johney Maine sensation intact via light touch bilateral.  Musculoskeletal: Mild tenderness at the right foot along the fifth ray and peroneal tendon course.  There is mild discomfort with inversion of the foot on right and there is mild guarding with range of motion due to pain along the fifth ray.  Gait: Antalgic gait  Xrays  Right foot   Impression: Normal osseous mineralization there is no fracture or dislocation injury hardware is intact at the first metatarsal, soft tissue swelling noted, no other acute findings.  Assessment and Plan: Problem List Items Addressed This Visit      Endocrine   Diabetes mellitus without complication ([place])    Other Visit Diagnoses    Right foot injury, initial encounter    -  Primary   Sprain of right foot, initial encounter           -Complete  examination performed -Xrays reviewed -Discussed treatement options for likely sprain with soft tissue injury -Applied a Unna boot strapping and advised patient to keep clean dry and intact for 5 days and to use cam boot of which she has at home to assist her with ambulation advised patient to continue with Tylenol rest ice elevation as needed for pain and edema control and advised patient to return to office in 1 week and if pain is not better we will proceed with ordering a MRI.  Landis Martins, DPM

## 2018-04-02 DIAGNOSIS — Z6833 Body mass index (BMI) 33.0-33.9, adult: Secondary | ICD-10-CM | POA: Diagnosis not present

## 2018-04-02 DIAGNOSIS — Z01419 Encounter for gynecological examination (general) (routine) without abnormal findings: Secondary | ICD-10-CM | POA: Diagnosis not present

## 2018-04-03 ENCOUNTER — Ambulatory Visit: Payer: 59 | Admitting: Sports Medicine

## 2018-04-10 DIAGNOSIS — Z1231 Encounter for screening mammogram for malignant neoplasm of breast: Secondary | ICD-10-CM | POA: Diagnosis not present

## 2018-04-10 DIAGNOSIS — Z13820 Encounter for screening for osteoporosis: Secondary | ICD-10-CM | POA: Diagnosis not present

## 2018-04-24 DIAGNOSIS — G479 Sleep disorder, unspecified: Secondary | ICD-10-CM | POA: Diagnosis not present

## 2018-04-24 DIAGNOSIS — R51 Headache: Secondary | ICD-10-CM | POA: Diagnosis not present

## 2018-05-07 DIAGNOSIS — N950 Postmenopausal bleeding: Secondary | ICD-10-CM | POA: Diagnosis not present

## 2018-06-12 ENCOUNTER — Encounter: Payer: 59 | Attending: General Surgery | Admitting: Skilled Nursing Facility1

## 2018-06-12 DIAGNOSIS — E119 Type 2 diabetes mellitus without complications: Secondary | ICD-10-CM | POA: Insufficient documentation

## 2018-06-13 ENCOUNTER — Encounter: Payer: Self-pay | Admitting: Skilled Nursing Facility1

## 2018-06-13 NOTE — Progress Notes (Addendum)
Follow-up visit:  Post-Operative  Surgery  Medical Nutrition Therapy:  Appt start time: 6:00pm end time:  7:00pm  Primary concerns today: Post-operative Bariatric Surgery Nutrition Management 6 Month Post-Op Class  Pt states she is having clumps of hair loss and is concerned. According to pts report she seems to be meeting her fluid and protein needs but she has not been taking the appropriate multivitamin.  Pt will call in 1 month for a follow up if her hair does not slow its loss.   Surgery date: 12/25/2017 Surgery type: RYGB Start weight at [facility]: 225.1 Weight today: 161.6 Weight change: 32  Weight loss goal: feel better, be more active   TANITA  BODY COMP RESULTS  01/09/2018 02/16/2018   BMI (kg/m^2) 38.4 35.4   Fat Mass (lbs) 100.6 88.8   Fat Free Mass (lbs) 109.4 104.8   Total Body Water (lbs) 78.8 75.0    Information Reviewed/ Discussed During Appointment: -Review of composition scale numbers -Fluid requirements (64-100 ounces) -Protein requirements (60-80g) -Strategies for tolerating diet -Advancement of diet to include Starchy vegetables -Barriers to inclusion of new foods -Inclusion of appropriate multivitamin and calcium supplements  -Exercise recommendations   Fluid intake: Adequate  Medications: See List Supplementation:  Not appropriate  Using straws: no Drinking while eating: no Having you been chewing well: yes Chewing/swallowing difficulties: no Changes in vision: no Changes to mood/headaches: no Hair loss/Cahnges to skin/Changes to nails: no Any difficulty focusing or concentrating: no Sweating: no Dizziness/Lightheaded: no Palpitations: no  Carbonated beverages: no N/V/D/C/GAS: no Abdominal Pain: no Dumping syndrome: no  Recent physical activity:  ADL's  Progress Towards Goal(s):  In Progress  Handouts given during visit include:  Phase V diet Progression   Goals Sheet  The Benefits of Exercise are endless.....  Support Group  Topics  Pt Chosen Goals: Nutrition Goals: I will drink 64 ounces of any sugar free, carbonation free option 7 days a week by (specific date) ____01/30/2020____  Physical Activity Goals: I will take the stairs whenever available by (specific date) ________________01/15/2020______________  Teaching Method Utilized:  Visual Auditory Hands on  Demonstrated degree of understanding via:  Teach Back   Monitoring/Evaluation:  Dietary intake, exercise, and body weight. Follow up in 3 months for 9 month post-op visit.

## 2018-06-14 DIAGNOSIS — R1013 Epigastric pain: Secondary | ICD-10-CM | POA: Diagnosis not present

## 2018-06-14 DIAGNOSIS — R1012 Left upper quadrant pain: Secondary | ICD-10-CM | POA: Diagnosis not present

## 2018-06-14 DIAGNOSIS — R110 Nausea: Secondary | ICD-10-CM | POA: Diagnosis not present

## 2018-06-15 ENCOUNTER — Other Ambulatory Visit: Payer: Self-pay | Admitting: Surgery

## 2018-07-12 ENCOUNTER — Encounter (HOSPITAL_COMMUNITY): Payer: Self-pay | Admitting: *Deleted

## 2018-07-12 ENCOUNTER — Other Ambulatory Visit: Payer: Self-pay

## 2018-07-12 NOTE — Progress Notes (Signed)
Spoke with patient via telephone for pre procedure interview. No medications AM of procedure. Husband Ray will be driver. Arrival time 0600. NPO after MN.

## 2018-07-12 NOTE — H&P (Signed)
Suzanne Gutierrez  Location: [facility] Patient #: [id number] DOB: [birth year] Married / Language: English / Race: White Female   History of Present Illness   The patient is a 53 year old female presenting status-post bariatric surgery. She returns 6 months following laparoscopic Roux-en-Y gastric bypass (12/25/2017 - [facility]) with preoperative comorbidities of hypertension and reflux. She had some postop issues with dizziness which resolved. She has been doing well over the past few months.   Her weight has gone from 223 (BMI 40.9) to 163 (BMI 29.8).  Over the past week, she has developed intense nausea, especially after eating foods. The smells of certain foods are now bothering her whereas they were not previously. This week she has basically been eating yogurt and cheese for protein. No issues with keeping fluids down. Two days ago, she felt an episode of sharp, stabbing, left upper quadrant pain which woke her up at night. After this resolved, she felt another episode one hour later. She only experiences vomiting about twice in 2 weeks, and this is always related to food not settling well. She denies heartburn. Has not taken Prilosec in 5 months. She has taken some Phenergan this week which has not been effective. Denies NSAID use. Has one cup of coffee every morning with protein powder. Does not smoke. Bowels are moving well.   Allergies Sabino Gasser, CMA; 06/14/2018 11:30 AM) Codeine Phosphate *ANALGESICS - OPIOID*  Nausea. Allergies Reconciled   Medication History Sabino Gasser, CMA; 06/14/2018 11:32 AM) Estradiol (0.05MG /78GN Patch Weekly, Transdermal) Active. Progesterone Micronized (200MG  Capsule, Oral) Active. traMADol-Acetaminophen (37.5-325MG  Tablet, Oral) Active. Zolpidem Tartrate (10MG  Tablet, Oral) Active. Atorvastatin Calcium (10MG  Tablet, Oral) Active. Escitalopram Oxalate (10MG  Tablet, Oral) Active. Multi-Vitamin (Oral) Active. Calcium  (500MG  Capsule, Oral) Active. Medications Reconciled    Review of Systems Kellie Shropshire PA C; 06/14/2018 12:40 PM) General Present- Night Sweats. Not Present- Appetite Loss, Chills, Fatigue, Fever, Weight Gain and Weight Loss. Skin Not Present- Change in Wart/Mole, Dryness, Hives, Jaundice, New Lesions, Non-Healing Wounds, Rash and Ulcer. HEENT Present- Wears glasses/contact lenses. Not Present- Earache, Hearing Loss, Hoarseness, Nose Bleed, Oral Ulcers, Ringing in the Ears, Seasonal Allergies, Sinus Pain, Sore Throat, Visual Disturbances and Yellow Eyes. Respiratory Not Present- Bloody sputum, Chronic Cough, Difficulty Breathing, Snoring and Wheezing. Breast Not Present- Breast Mass, Breast Pain, Nipple Discharge and Skin Changes. Cardiovascular Present- Leg Cramps and Swelling of Extremities. Not Present- Chest Pain, Difficulty Breathing Lying Down, Palpitations, Rapid Heart Rate and Shortness of Breath. Gastrointestinal Present- Difficulty Swallowing, Hemorrhoids and Indigestion. Not Present- Abdominal Pain, Bloating, Bloody Stool, Change in Bowel Habits, Chronic diarrhea, Constipation, Excessive gas, Gets full quickly at meals, Nausea, Rectal Pain and Vomiting. Female Genitourinary Not Present- Frequency, Nocturia, Painful Urination, Pelvic Pain and Urgency. Musculoskeletal Present- Back Pain and Joint Pain. Not Present- Joint Stiffness, Muscle Pain, Muscle Weakness and Swelling of Extremities. Neurological Present- Headaches. Not Present- Decreased Memory, Fainting, Numbness, Seizures, Tingling, Tremor, Trouble walking and Weakness. Psychiatric Present- Anxiety. Not Present- Bipolar, Change in Sleep Pattern, Depression, Fearful and Frequent crying. Endocrine Present- Hair Changes and Hot flashes. Not Present- Cold Intolerance, Excessive Hunger, Heat Intolerance and New Diabetes. Hematology Not Present- Blood Thinners, Easy Bruising, Excessive bleeding, Gland problems, HIV and Persistent  Infections.  Vitals Sabino Gasser CMA; 06/14/2018 11:33 AM) 06/14/2018 11:32 AM Weight: 163 lb Height: 62in Body Surface Area: 1.75 m Body Mass Index: 29.81 kg/m  Temp.: 33F(Oral)  Pulse: 100 (Regular)  BP: 122/72 (Sitting, Left Arm, Standard)   Physical  Exam  General: Appears well Lungs: Clear to respirations Abdomen: Generally soft and nontender. At the left upper quadrant, near the inferior aspect of the costal ridge, there is some point tenderness with deep palpation. No rebound or guarding. No hernia or mass.  Assessment & Plan  1.  S/P GASTRIC BYPASS (Z98.84)  Impression: She is 6 months following laparoscopic gastric bypass for morbid obesity and comorbidities of hypertension and reflux. Overall she is doing well, but with her recent nausea and 2 episodes of left upper quadrant pain, I discussed her case with Dr. Excell Seltzer who recommends obtaining a CBC, BMP, upper endoscopy, and placing her on Protonix and Carafate. Although it is not very clear at this time, we would like to rule out an ulcer. We will send her to Dr. Lucia Gaskins to have an upper endoscopy and we'll touch base with her once the results are finalized. She knows to call with questions or concerns in the meantime including new or worsening symptoms.   Current Plans  Started Protonix 40 MG Oral Tablet Delayed Release, 1 (one) Tablet once daily, #30, 30 days starting 06/14/2018, No Refill.  Started Carafate 1 GM/10ML Oral Suspension, 1 (one) Milliliter 62ml four times daily, 1200 Milliliter, 30 days starting 06/14/2018, No Refill.   Follow Up - Call [facility] office after tests / studies doneto discuss further plans - We will set you up for an upper endoscopy by Dr. Lucia Gaskins - We will update Dr. Excell Seltzer once the endoscopy is done    Alphonsa Overall, MD, [facility] Pager: [phone] Office phone:  [phone]

## 2018-07-13 ENCOUNTER — Encounter (HOSPITAL_COMMUNITY): Payer: Self-pay | Admitting: *Deleted

## 2018-07-13 ENCOUNTER — Ambulatory Visit (HOSPITAL_COMMUNITY)
Admission: RE | Admit: 2018-07-13 | Discharge: 2018-07-13 | Disposition: A | Discharge status: Home / Self Care | Payer: 59 | Attending: Surgery | Admitting: Surgery

## 2018-07-13 ENCOUNTER — Encounter (HOSPITAL_COMMUNITY)
Admission: RE | Disposition: A | Discharge status: Home / Self Care | Payer: Self-pay | Source: Home / Self Care | Attending: Surgery

## 2018-07-13 ENCOUNTER — Ambulatory Visit (HOSPITAL_COMMUNITY): Payer: 59 | Admitting: Anesthesiology

## 2018-07-13 DIAGNOSIS — Z885 Allergy status to narcotic agent status: Secondary | ICD-10-CM | POA: Insufficient documentation

## 2018-07-13 DIAGNOSIS — Z6829 Body mass index (BMI) 29.0-29.9, adult: Secondary | ICD-10-CM | POA: Diagnosis not present

## 2018-07-13 DIAGNOSIS — R109 Unspecified abdominal pain: Secondary | ICD-10-CM | POA: Diagnosis not present

## 2018-07-13 DIAGNOSIS — R1013 Epigastric pain: Secondary | ICD-10-CM | POA: Insufficient documentation

## 2018-07-13 DIAGNOSIS — Z886 Allergy status to analgesic agent status: Secondary | ICD-10-CM | POA: Diagnosis not present

## 2018-07-13 DIAGNOSIS — K219 Gastro-esophageal reflux disease without esophagitis: Secondary | ICD-10-CM | POA: Diagnosis not present

## 2018-07-13 DIAGNOSIS — Z9884 Bariatric surgery status: Secondary | ICD-10-CM | POA: Diagnosis not present

## 2018-07-13 DIAGNOSIS — E119 Type 2 diabetes mellitus without complications: Secondary | ICD-10-CM | POA: Diagnosis not present

## 2018-07-13 DIAGNOSIS — Z09 Encounter for follow-up examination after completed treatment for conditions other than malignant neoplasm: Secondary | ICD-10-CM | POA: Diagnosis not present

## 2018-07-13 DIAGNOSIS — Z79899 Other long term (current) drug therapy: Secondary | ICD-10-CM | POA: Diagnosis not present

## 2018-07-13 DIAGNOSIS — I10 Essential (primary) hypertension: Secondary | ICD-10-CM | POA: Diagnosis not present

## 2018-07-13 HISTORY — PX: BIOPSY: SHX5522

## 2018-07-13 HISTORY — PX: ESOPHAGOGASTRODUODENOSCOPY (EGD) WITH PROPOFOL: SHX5813

## 2018-07-13 SURGERY — ESOPHAGOGASTRODUODENOSCOPY (EGD) WITH PROPOFOL
Anesthesia: Monitor Anesthesia Care

## 2018-07-13 MED ORDER — PROPOFOL 500 MG/50ML IV EMUL
INTRAVENOUS | Status: DC | PRN
  Administered 2018-07-13: 150 ug/kg/min via INTRAVENOUS

## 2018-07-13 MED ORDER — SODIUM CHLORIDE 0.9 % IV SOLN: INTRAVENOUS | Status: DC

## 2018-07-13 MED ORDER — PROPOFOL 10 MG/ML IV BOLUS
INTRAVENOUS | Status: DC | PRN
  Administered 2018-07-13: 20 mg via INTRAVENOUS

## 2018-07-13 MED ORDER — PROPOFOL 10 MG/ML IV BOLUS
INTRAVENOUS | Status: AC
  Filled 2018-07-13: qty 40

## 2018-07-13 MED ORDER — PANTOPRAZOLE SODIUM 40 MG PO TBEC: 40.0000 mg | DELAYED_RELEASE_TABLET | Freq: Every day | ORAL | 2 refills | Status: DC

## 2018-07-13 MED ORDER — LACTATED RINGERS IV SOLN
INTRAVENOUS | Status: DC
  Administered 2018-07-13: 1000 mL via INTRAVENOUS

## 2018-07-13 MED ORDER — LIDOCAINE 2% (20 MG/ML) 5 ML SYRINGE
INTRAMUSCULAR | Status: DC | PRN
  Administered 2018-07-13: 100 mg via INTRAVENOUS

## 2018-07-13 SURGICAL SUPPLY — 15 items

## 2018-07-13 NOTE — Discharge Instructions (Signed)
[facility] - DISCHARGE INSTRUCTIONS TO PATIENT  Diet:  Bariatric diet  Follow up appointment:  Call Dr. Lear Ng office [facility]) at [phone] for an appointment in about 6 weeks.  Medications and dosages:  Resume your home medications.  You have a prescription for:  Protonix   In an emergency, call 911 or go to an Emergency Department at a nearby hospital.

## 2018-07-13 NOTE — Op Note (Addendum)
07/13/2018  7:54 AM  PATIENT:  Suzanne Gutierrez, 53 y.o., female, MRN: [id number]  PREOP DIAGNOSIS:  History of gastric bypass, epigastric pain  POSTOP DIAGNOSIS:   Normal gastric bypass anatomy, no obvious ulcer (though questionable healing area at anastomosis)  PROCEDURE:  Esophagogastrojejunoscopy [photos taken in Epic]  SURGEON:   Alphonsa Overall, M.D.  ANESTHESIA:   Anesthesiologist: Freddrick March, MD CRNA: Marijo Conception, CRNA  INDICATIONS FOR PROCEDURE:  Suzanne Gutierrez is a 53 y.o. (DOB: [birth year])  white female whose primary care physician is Demorest, L.Marlou Sa, MD and comes for upper endoscopy to evaluate epigastric pain.  The patient had a RYGB on 12/25/2017 by Dr. Excell Seltzer.  She saw Puja about 3 weeks ago who put her on protonix.  Her symptoms are a little better.   The indications and risks of the endoscopy were explained to the patient.  The risks include, but are not limited to, perforation, bleeding, or injury to the bowel.  If balloon dilatation is needed, the risk of perforation is higher.  PROCEDURE:  The patient was in room 1 at [facility] endoscopy unit.  The patient was monitored with a pulse oximetry, BP cuff, and EKG.  The patient has nasal O2 flowing during the procedure.   A time was held prior to the procedure.   Anesthesia provided the sedation.  A flexible Olympus endoscope was passed down the throat without difficulty.   Findings include:   Esophagus:   Normal   GE junction at:  33 cm   Stomach pouch: Normal.  There was still some evidence of the staple line healing.   Gastrojejunal anastomosis:   38 cm.  There was no obvious ulcer.  There was an area that may be a healing ulcer vs a healing anastomosis - it was not obvious.  The anastomosis is widely patent.   Efferent jejunal limb:  15 cm - normal   Afferent jejunal limb:  10 cm and normal   CLO test:  done  PLAN:   Photos taken and given to patient.    Continue daily Protonix  Follow up with  Dr. Excell Seltzer in 4 to 6 weeks.  Alphonsa Overall, MD, [facility] Pager: [phone] Office phone:  [phone]

## 2018-07-13 NOTE — Transfer of Care (Signed)
Immediate Anesthesia Transfer of Care Note  Patient: Suzanne Gutierrez  Procedure(s) Performed: ESOPHAGOGASTRODUODENOSCOPY (EGD) WITH PROPOFOL (N/A ) BIOPSY  Patient Location: PACU  Anesthesia Type:MAC  Level of Consciousness: awake, alert  and oriented  Airway & Oxygen Therapy: Patient Spontanous Breathing and Patient connected to face mask oxygen  Post-op Assessment: Report given to RN and Post -op Vital signs reviewed and stable  Post vital signs: Reviewed and stable  Last Vitals:  Vitals Value Taken Time  BP 114/73 07/13/2018  8:00 AM  Temp    Pulse 71 07/13/2018  8:00 AM  Resp 16 07/13/2018  8:00 AM  SpO2 100 % 07/13/2018  8:00 AM  Vitals shown include unvalidated device data.  Last Pain:  Vitals:   07/13/18 8250  PainSc: 0-No pain         Complications: No apparent anesthesia complications

## 2018-07-13 NOTE — Interval H&P Note (Signed)
History and Physical Interval Note:  07/13/2018 7:29 AM  Suzanne Gutierrez  has presented today for surgery, with the diagnosis of abdominal pain  The various methods of treatment have been discussed with the patient and family.   Her husband is with her.  She had an upper endo by Dr. Amedeo Plenty about 2 years ago.  He stretched her esophagu.  After consideration of risks, benefits and other options for treatment, the patient has consented to  Procedure(s): ESOPHAGOGASTRODUODENOSCOPY (EGD) WITH PROPOFOL (N/A) as a surgical intervention .  The patient's history has been reviewed, patient examined, no change in status, stable for surgery.  I have reviewed the patient's chart and labs.  Questions were answered to the patient's satisfaction.     Shann Medal

## 2018-07-13 NOTE — Anesthesia Postprocedure Evaluation (Signed)
Anesthesia Post Note  Patient: Suzanne Gutierrez  Procedure(s) Performed: ESOPHAGOGASTRODUODENOSCOPY (EGD) WITH PROPOFOL (N/A ) BIOPSY     Patient location during evaluation: Endoscopy Anesthesia Type: MAC Level of consciousness: awake and alert Pain management: pain level controlled Vital Signs Assessment: post-procedure vital signs reviewed and stable Respiratory status: spontaneous breathing, nonlabored ventilation, respiratory function stable and patient connected to nasal cannula oxygen Cardiovascular status: blood pressure returned to baseline and stable Postop Assessment: no apparent nausea or vomiting Anesthetic complications: no    Last Vitals:  Vitals:   07/13/18 0801 07/13/18 0810  BP: 114/73 107/68  Pulse: 69 68  Resp: 14 13  Temp: 36.8 C   SpO2: 100% 100%    Last Pain:  Vitals:   07/13/18 0810  TempSrc:   PainSc: 0-No pain                 Elliott Gutierrez L Cataleyah Colborn

## 2018-07-13 NOTE — Anesthesia Preprocedure Evaluation (Addendum)
Anesthesia Evaluation  Patient identified by MRN, date of birth, ID band Patient awake    Reviewed: Allergy & Precautions, NPO status , Patient's Chart, lab work & pertinent test results  History of Anesthesia Complications (+) PONV and history of anesthetic complications  Airway Mallampati: I  TM Distance: >3 FB Neck ROM: Full    Dental no notable dental hx. (+) Teeth Intact, Dental Advisory Given   Pulmonary neg pulmonary ROS,    Pulmonary exam normal breath sounds clear to auscultation       Cardiovascular hypertension, negative cardio ROS Normal cardiovascular exam Rhythm:Regular Rate:Normal  TTE 06/2018 EF 60-65%, no valvular abnormalities   Neuro/Psych  Headaches, PSYCHIATRIC DISORDERS Bipolar Disorder    GI/Hepatic Neg liver ROS, GERD  Medicated,  Endo/Other  negative endocrine ROSdiabetes, Type 2  Renal/GU negative Renal ROS  negative genitourinary   Musculoskeletal negative musculoskeletal ROS (+)   Abdominal   Peds negative pediatric ROS (+)  Hematology negative hematology ROS (+)   Anesthesia Other Findings EGD for abdominal pain, s/p gastric bypass 11/2017  Reproductive/Obstetrics negative OB ROS                            Anesthesia Physical Anesthesia Plan  ASA: II  Anesthesia Plan: MAC   Post-op Pain Management:    Induction: Intravenous  PONV Risk Score and Plan: 3 and Treatment may vary due to age or medical condition and Propofol infusion  Airway Management Planned: Natural Airway  Additional Equipment:   Intra-op Plan:   Post-operative Plan:   Informed Consent: I have reviewed the patients History and Physical, chart, labs and discussed the procedure including the risks, benefits and alternatives for the proposed anesthesia with the patient or authorized representative who has indicated his/her understanding and acceptance.     Dental advisory  given  Plan Discussed with: CRNA  Anesthesia Plan Comments:         Anesthesia Quick Evaluation

## 2018-07-14 LAB — CLOTEST (H. PYLORI), BIOPSY: Helicobacter screen: NEGATIVE

## 2018-07-16 ENCOUNTER — Encounter (HOSPITAL_COMMUNITY): Payer: Self-pay | Admitting: Surgery

## 2018-07-24 DIAGNOSIS — Z9884 Bariatric surgery status: Secondary | ICD-10-CM | POA: Diagnosis not present

## 2018-07-24 DIAGNOSIS — M25511 Pain in right shoulder: Secondary | ICD-10-CM | POA: Diagnosis not present

## 2018-07-31 DIAGNOSIS — M25519 Pain in unspecified shoulder: Secondary | ICD-10-CM | POA: Diagnosis not present

## 2018-07-31 DIAGNOSIS — M7541 Impingement syndrome of right shoulder: Secondary | ICD-10-CM | POA: Diagnosis not present

## 2018-07-31 DIAGNOSIS — M25511 Pain in right shoulder: Secondary | ICD-10-CM | POA: Diagnosis not present

## 2018-08-02 ENCOUNTER — Other Ambulatory Visit: Payer: Self-pay

## 2018-08-02 DIAGNOSIS — D485 Neoplasm of uncertain behavior of skin: Secondary | ICD-10-CM | POA: Diagnosis not present

## 2018-08-02 DIAGNOSIS — L570 Actinic keratosis: Secondary | ICD-10-CM | POA: Diagnosis not present

## 2018-08-02 DIAGNOSIS — L309 Dermatitis, unspecified: Secondary | ICD-10-CM | POA: Diagnosis not present

## 2018-08-07 ENCOUNTER — Encounter: Payer: 59 | Attending: General Surgery | Admitting: Skilled Nursing Facility1

## 2018-08-07 DIAGNOSIS — E119 Type 2 diabetes mellitus without complications: Secondary | ICD-10-CM | POA: Diagnosis not present

## 2018-08-07 NOTE — Progress Notes (Signed)
Follow-up visit:  Post-Operative RYGB Surgery  Medical Nutrition Therapy:  Appt start time: 6:00pm end time:  7:00pm  Primary concerns today: Post-operative Bariatric Surgery Nutrition Management 6 Month Post-Op Class  Pt arrives stating she is still having hair loss. Pt states she sleeps well at night no longer taking Ambien. Pt states she was having some nausea due to an ulcer that is still healing so taking protonix thinking her doctor will take her off it soon.  Dietitian asked specifically if pt was taking the serving per the label pt answered yes dietitian advised pt the multivitamin she is taking is not a once daily she needs to take it 3 times a day. Pt states she eat her protein and vegetable and then if she has room takes a bite of some kind of carbohydrate, Pt state she does not desire to lose weight anymore. Pt states she does not like meat.  Pt is concerned with her weight loss and wishes it to stop.   Surgery date: 12/25/2017 Surgery type: RYGB Start weight at [facility]: 225.1 Weight today: 150.6 Weight change: 11  Weight loss goal: feel better, be more active   Body Composition Scale  Total Body Fat: 32.7 %  Visceral Fat: 8  Fat-Free Mass: 67.2  %   Total Body Water: 48.1  %  Muscle-Mass: 28.4 lbs  Body Fat Displacement:  Torso: 30.4  lbs Left Leg: 6  lbs Right Leg: 6  lbs Left Arm: 3 lbs Right Arm: 3 lbs  24 hr recall: Eating every 2-3 hours Coffee with sugar free creamer  Drinking water up until 5pm and then diet cranberry or apple juice  First meal: 1/2 packet maple oatmeal or yogurt (eaten every day) or 1 egg with cheese and 2 bacon/ham/sausage Cheese stick or 1-2 pork rinds Second meal: yogurt with cheese stick or yogurt or chicken and green beans Low sugar cheese puffs or paleo bar Third meal: chicken or fish or shrimp or beans with green beans or broccoli or cauliflower pizza sugar free popcicle     Fluid intake: water, diet juice, coffee:  55-70oz  Medications: See List Supplementation:  1 of 3 appropriate multi and calcium  Using straws: no Drinking while eating: no Having you been chewing well: yes Chewing/swallowing difficulties: no Changes in vision: no Changes to mood/headaches: no Hair loss/Cahnges to skin/Changes to nails: no Any difficulty focusing or concentrating: no Sweating: no Dizziness/Lightheaded: no Palpitations: no  Carbonated beverages: no N/V/D/C/GAS: no Abdominal Pain: no Dumping syndrome: no  Recent physical activity:  BELT and stationary bike while watching tv  Progress Towards Goal(s):  In Progress  Goals: Choose more vegetarian and seafood options rather than chicken Take 3 of your multivitamin   Teaching Method Utilized:  Visual Auditory Hands on  Demonstrated degree of understanding via:  Teach Back   Monitoring/Evaluation:  Dietary intake, exercise, and body weight. Follow up in

## 2018-08-14 DIAGNOSIS — M7541 Impingement syndrome of right shoulder: Secondary | ICD-10-CM | POA: Insufficient documentation

## 2018-08-15 DIAGNOSIS — M7541 Impingement syndrome of right shoulder: Secondary | ICD-10-CM | POA: Diagnosis not present

## 2018-09-21 DIAGNOSIS — M7541 Impingement syndrome of right shoulder: Secondary | ICD-10-CM | POA: Diagnosis not present

## 2018-09-24 DIAGNOSIS — M7501 Adhesive capsulitis of right shoulder: Secondary | ICD-10-CM | POA: Diagnosis not present

## 2018-09-24 DIAGNOSIS — M7541 Impingement syndrome of right shoulder: Secondary | ICD-10-CM | POA: Diagnosis not present

## 2018-09-25 DIAGNOSIS — M7501 Adhesive capsulitis of right shoulder: Secondary | ICD-10-CM | POA: Insufficient documentation

## 2018-10-15 DIAGNOSIS — M7501 Adhesive capsulitis of right shoulder: Secondary | ICD-10-CM | POA: Diagnosis not present

## 2018-10-24 DIAGNOSIS — Z0000 Encounter for general adult medical examination without abnormal findings: Secondary | ICD-10-CM | POA: Diagnosis not present

## 2018-10-30 ENCOUNTER — Other Ambulatory Visit: Payer: Self-pay

## 2018-10-30 ENCOUNTER — Encounter: Payer: 59 | Attending: General Surgery | Admitting: Skilled Nursing Facility1

## 2018-10-30 DIAGNOSIS — E119 Type 2 diabetes mellitus without complications: Secondary | ICD-10-CM | POA: Diagnosis present

## 2018-10-30 NOTE — Progress Notes (Signed)
Follow-up visit:  Post-Operative RYGB Surgery  Medical Nutrition Therapy:  Appt start time: 6:00pm end time:  7:00pm  Primary concerns today: Post-operative Bariatric Surgery Nutrition Management 6 Month Post-Op Class  Pt states since covid she has been planning her meals more and eating better. Pt states she is no longer taking protonix and no longer has acid reflux. Pt states she is eating anything she wants to but she has noticed sweets causes her to feel flushed and get cramps  So she avoids them: occurring when she overeats them so 1 cookie or a couple bites of cake is totally fine. Pt advised to take advantage of her body teaching hr what portions are appropriate for long term success. Pt states she is very comfortable with her current weight and very happy. Pt states she feels she needs caffeine to get her day started. Pt wants to stop losing weight.  Pt states she does have a skin condition resulting in dry patches.   Surgery date: 12/25/2017 Surgery type: RYGB Start weight at [facility]: 225.1 Weight today: 144.5 Weight change: 6.1  Weight loss goal: feel better, be more active   Body Composition Scale 10/30/2018  Total Body Fat % 31.1  Visceral Fat 7  Fat-Free Mass % 68.8   Total Body Water % 48.9   Muscle-Mass lbs 28.3  Body Fat Displacement          Torso  lbs 27.7         Left Leg  lbs 5.5         Right Leg  lbs 5.5         Left Arm  lbs 2.7         Right Arm   lbs 2.7    24 hr recall: Eating every 2-3 hours Coffee with sugar free creamer with 1 scoop of protein powder Drinking water up until 5pm and then diet cranberry or apple juice  First meal: greek yogurt or 1 scrambled egg with cheese and ham or 1 peanut butter on wheat toast or 1 packet of oatmeal  Cheese stick or 1-2 pork rinds or handful peanuts or 1 cup of fairlife milk or low carb cheese puffs or trailmix super food pieces  Or atkin bar Second meal: yogurt with cheese stick or yogurt or chicken and green beans or  chicken nugget grilled with diet lemonade  Low sugar cheese puffs or paleo bar or 4-5 pretzels with peanut butter or 1/2 muscle milk Third meal: chicken or fish or shrimp or beans with green beans or broccoli or cauliflower pizza  Fluid intake: water, diet juice, coffee: 70oz+ Protein: A minimum of 80 grams of protein a day Medications: See List Supplementation: appropriate multi capsule and calcium  Using straws: no Drinking while eating: no Having you been chewing well: yes Chewing/swallowing difficulties: no Changes in vision: no Changes to mood/headaches: no Hair loss/Changes to skin/Changes to nails: losing hair; clumps of hair coming out  Any difficulty focusing or concentrating: no Sweating: no Dizziness/Lightheaded: no Palpitations: no  Carbonated beverages: no N/V/D/C/GAS: having a bowel movement once every 3-4 days: using miralax  Abdominal Pain: no Dumping syndrome: no  Recent physical activity:  stationary bike while watching tv and floor stretches and walking   Progress Towards Goal(s):  In Progress  Goals: Have more substantial lunches Have a caloric snack after dinner instead of sugar free popcicle Try kefir  Speak with your surgeon, OB, and your condition specialist about your hair loss  Teaching Method Utilized:  Visual Auditory Hands on  Demonstrated degree of understanding via:  Teach Back   Monitoring/Evaluation:  Dietary intake, exercise, and body weight. Follow up in 2 months

## 2019-01-01 ENCOUNTER — Ambulatory Visit: Payer: 59 | Admitting: Skilled Nursing Facility1

## 2019-01-16 ENCOUNTER — Ambulatory Visit: Payer: 59 | Admitting: Skilled Nursing Facility1

## 2019-02-11 ENCOUNTER — Encounter: Payer: 59 | Attending: General Surgery | Admitting: Skilled Nursing Facility1

## 2019-02-11 ENCOUNTER — Other Ambulatory Visit: Payer: Self-pay

## 2019-02-11 DIAGNOSIS — E119 Type 2 diabetes mellitus without complications: Secondary | ICD-10-CM | POA: Diagnosis not present

## 2019-02-11 NOTE — Progress Notes (Signed)
Follow-up visit:  Post-Operative RYGB Surgery  Primary concerns today: Post-operative Bariatric Surgery Nutrition Management 6 Month Post-Op Class  Pt states she really does not want to lose anymore weight.  Pt states her plastic surgeon told her her B12 was low but doe snot remember the number. Pt states she is still experiencing hair loss and has yet to call her doctor about it.  Pt states she had rotator cuff surgery and eye lid skin surgery.  Pt states after her workout she feels energized.   Surgery date: 12/25/2017 Surgery type: RYGB Start weight at [facility]: 225.1 Weight today: 136.3 Weight change: 8.2  Weight loss goal: feel better, be more active   Body Composition Scale 10/30/2018 02/11/2019  Total Body Fat % 31.1 28.9  Visceral Fat 7 7  Fat-Free Mass % 68.8 71   Total Body Water % 48.9 50   Muscle-Mass lbs 28.3 28.2  Body Fat Displacement           Torso  lbs 27.7 24.2         Left Leg  lbs 5.5 4.8         Right Leg  lbs 5.5 4.8         Left Arm  lbs 2.7 2.4         Right Arm   lbs 2.7 2.4    24 hr recall: Eating every 2-3 hours Coffee with sugar free creamer with 1 scoop of protein powder Drinking water up until 5pm and then diet cranberry or apple juice  First meal 9am: greek yogurt or 1 scrambled egg with cheese and ham or 1 peanut butter on wheat toast or 1 packet of oatmeal or granola bar sometimes with half an apple 2 spoons of peanut butter or nuts Second meal: yogurt or 2 bites shy of half a wrap  Protein shake or handful of peanuts  Third meal: chicken or fish or shrimp or beans with green beans or broccoli or cauliflower pizza  Fluid intake: water, diet juice, coffee: 70oz+ Protein: A minimum of 80 grams of protein a day Medications: See List Supplementation: appropriate multi capsule and calcium  Using straws: no Drinking while eating: no Having you been chewing well: yes Chewing/swallowing difficulties: no Changes in vision: no Changes to  mood/headaches: no Hair loss/Changes to skin/Changes to nails: losing hair; clumps of hair coming out  Any difficulty focusing or concentrating: no Sweating: no Dizziness/Lightheaded: no Palpitations: no  Carbonated beverages: no N/V/D/C/GAS: having a bowel movement once every 3-4 days: using miralax  Abdominal Pain: no Dumping syndrome: no  Recent physical activity:  stationary bike while watching tv and floor stretches and walking   Progress Towards Goal(s):  In Progress  Goals:  Aim to have 1 snack shake with at least [place] Call your surgeon for your yearly check up Call your plastic surgeon and ask for your b12 lab: 450 and up is good   Teaching Method Utilized:  Visual Auditory Hands on  Demonstrated degree of understanding via:  Teach Back   Monitoring/Evaluation:  Dietary intake, exercise, and body weight. Follow up in 6 weeks

## 2019-02-13 ENCOUNTER — Ambulatory Visit: Payer: 59 | Admitting: Skilled Nursing Facility1

## 2019-03-02 IMAGING — DX DG CHEST 2V
2 series · 2 of 2 positions shown · non-contrast
Comparison: June 23, 2017.

CLINICAL DATA: Chest pain and shortness of breath

EXAM:
CHEST  2 VIEW

[chest pa]
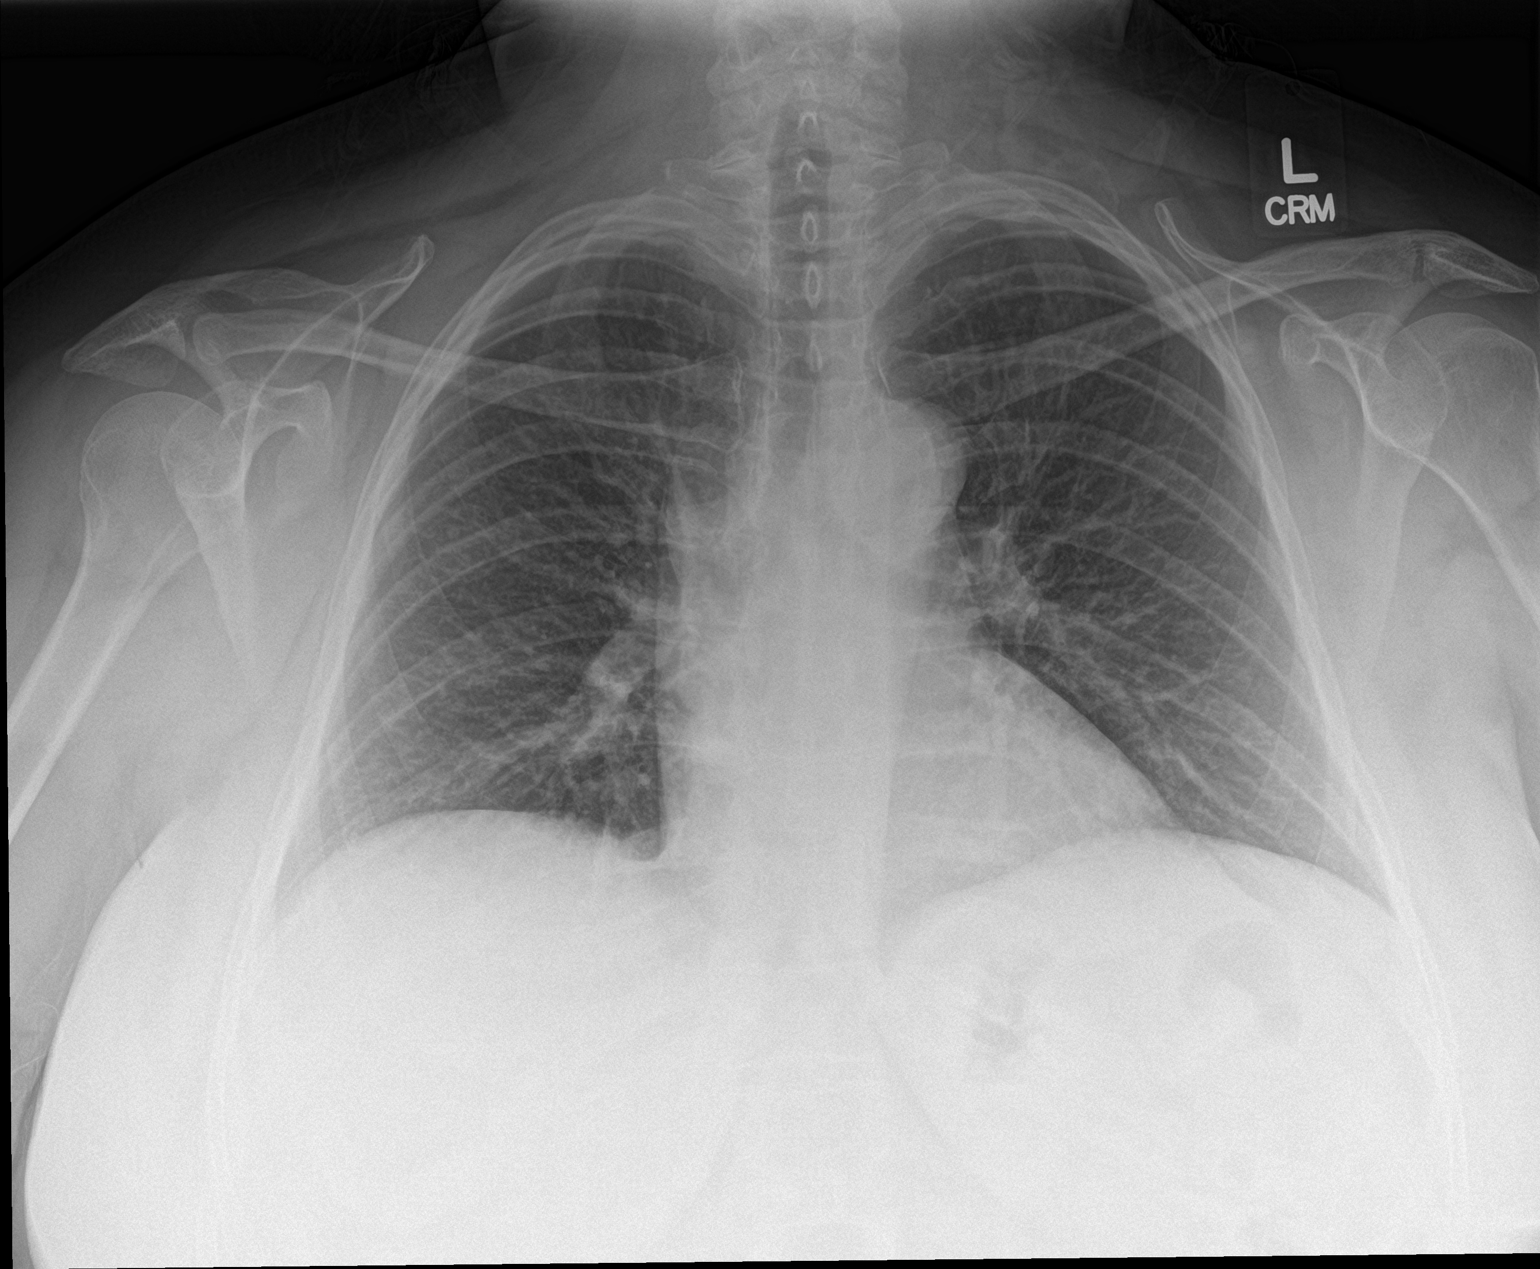

[chest lat]
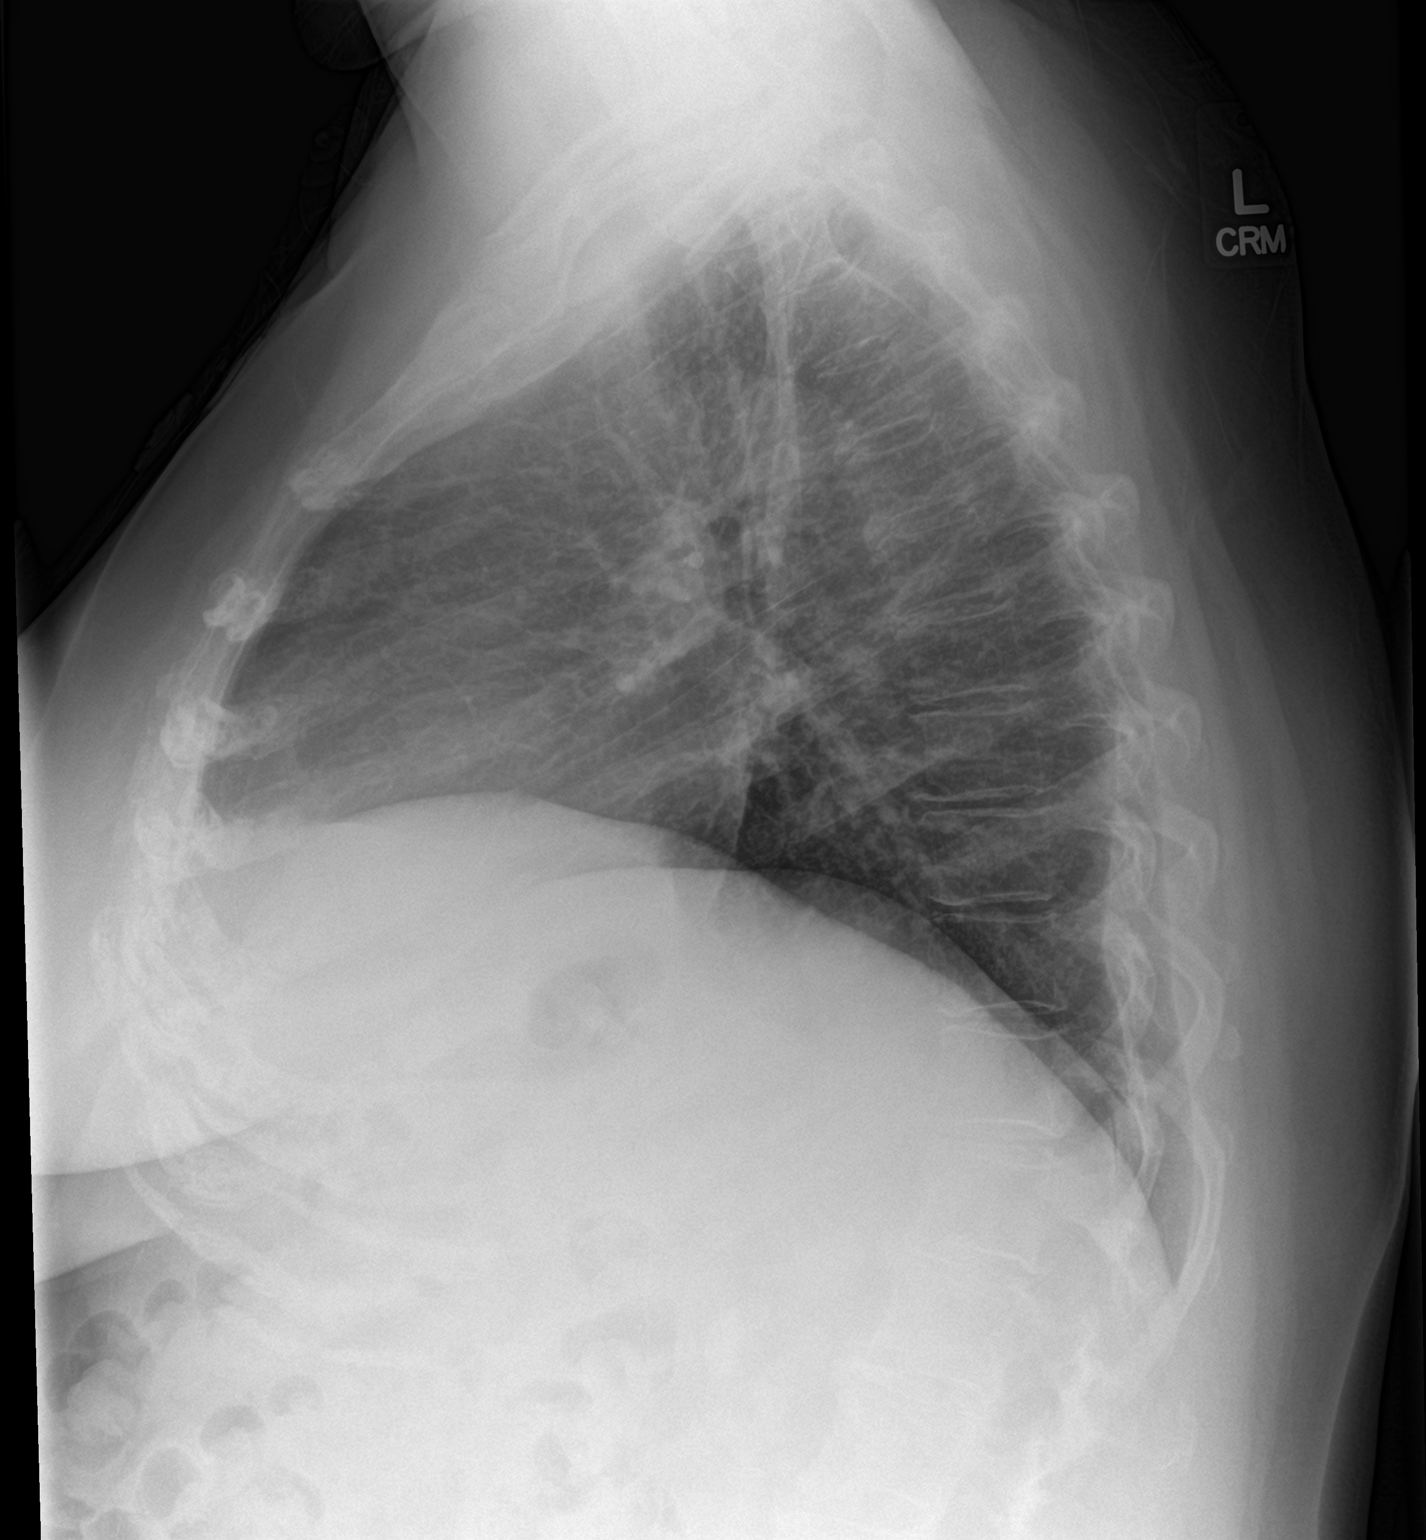

[2 of 2 positions shown; findings below may reference images not displayed]

FINDINGS: There is no edema or consolidation. The heart size and pulmonary
vascularity are normal. No adenopathy. There is aortic
atherosclerosis. No evident bone lesions.
IMPRESSION: Aortic atherosclerosis.  No edema or consolidation.

Aortic Atherosclerosis (Q6BTX-TIN.N).

## 2019-03-26 ENCOUNTER — Ambulatory Visit: Payer: 59 | Admitting: Dietician

## 2019-04-08 ENCOUNTER — Ambulatory Visit (HOSPITAL_COMMUNITY)
Admission: RE | Admit: 2019-04-08 | Discharge: 2019-04-08 | Disposition: A | Discharge status: Home / Self Care | Payer: 59 | Source: Ambulatory Visit | Attending: Student | Admitting: Student

## 2019-04-08 ENCOUNTER — Other Ambulatory Visit: Payer: Self-pay | Admitting: Student

## 2019-04-08 ENCOUNTER — Other Ambulatory Visit: Payer: Self-pay

## 2019-04-08 ENCOUNTER — Other Ambulatory Visit (HOSPITAL_COMMUNITY): Payer: Self-pay | Admitting: Student

## 2019-04-08 DIAGNOSIS — R1084 Generalized abdominal pain: Secondary | ICD-10-CM | POA: Insufficient documentation

## 2019-04-08 MED ORDER — SODIUM CHLORIDE (PF) 0.9 % IJ SOLN
INTRAMUSCULAR | Status: AC
  Filled 2019-04-08: qty 50

## 2019-04-08 MED ORDER — IOHEXOL 300 MG/ML  SOLN
100.0000 mL | Freq: Once | INTRAMUSCULAR | Status: AC | PRN
  Administered 2019-04-08: 15:00:00 100 mL via INTRAVENOUS

## 2019-04-08 MED ORDER — IOHEXOL 300 MG/ML  SOLN
30.0000 mL | Freq: Once | INTRAMUSCULAR | Status: AC | PRN
  Administered 2019-04-08: 13:00:00 30 mL via ORAL

## 2019-05-01 ENCOUNTER — Emergency Department (HOSPITAL_COMMUNITY): Payer: 59

## 2019-05-01 ENCOUNTER — Encounter (HOSPITAL_COMMUNITY): Payer: Self-pay | Admitting: Emergency Medicine

## 2019-05-01 ENCOUNTER — Emergency Department (HOSPITAL_COMMUNITY)
Admission: EM | Admit: 2019-05-01 | Discharge: 2019-05-01 | Disposition: A | Discharge status: Home / Self Care | Payer: 59 | Attending: Emergency Medicine | Admitting: Emergency Medicine

## 2019-05-01 ENCOUNTER — Other Ambulatory Visit: Payer: Self-pay

## 2019-05-01 DIAGNOSIS — R1032 Left lower quadrant pain: Secondary | ICD-10-CM | POA: Insufficient documentation

## 2019-05-01 DIAGNOSIS — E119 Type 2 diabetes mellitus without complications: Secondary | ICD-10-CM | POA: Insufficient documentation

## 2019-05-01 DIAGNOSIS — Z79899 Other long term (current) drug therapy: Secondary | ICD-10-CM | POA: Diagnosis not present

## 2019-05-01 DIAGNOSIS — I10 Essential (primary) hypertension: Secondary | ICD-10-CM | POA: Diagnosis not present

## 2019-05-01 LAB — COMPREHENSIVE METABOLIC PANEL
ALT: 16 U/L (ref 0–44)
AST: 18 U/L (ref 15–41)
Albumin: 4.5 g/dL (ref 3.5–5.0)
Alkaline Phosphatase: 74 U/L (ref 38–126)
Anion gap: 10 (ref 5–15)
BUN: 15 mg/dL (ref 6–20)
CO2: 25 mmol/L (ref 22–32)
Calcium: 9.3 mg/dL (ref 8.9–10.3)
Chloride: 106 mmol/L (ref 98–111)
Creatinine, Ser: 0.6 mg/dL (ref 0.44–1.00)
GFR calc Af Amer: >60 mL/min (ref >60)
GFR calc non Af Amer: >60 mL/min (ref >60)
Glucose, Bld: 99 mg/dL (ref 70–99)
Potassium: 4 mmol/L (ref 3.5–5.1)
Sodium: 141 mmol/L (ref 135–145)
Total Bilirubin: 0.9 mg/dL (ref 0.3–1.2)
Total Protein: 7.6 g/dL (ref 6.5–8.1)

## 2019-05-01 LAB — URINALYSIS, ROUTINE W REFLEX MICROSCOPIC
Bilirubin Urine: NEGATIVE
Glucose, UA: NEGATIVE mg/dL
Hgb urine dipstick: NEGATIVE
Ketones, ur: NEGATIVE mg/dL
Nitrite: NEGATIVE
Protein, ur: NEGATIVE mg/dL
Specific Gravity, Urine: 1.008 (ref 1.005–1.030)
pH: 6 (ref 5.0–8.0)

## 2019-05-01 LAB — CBC WITH DIFFERENTIAL/PLATELET
Abs Immature Granulocytes: 0.01 10*3/uL (ref 0.00–0.07)
Basophils Absolute: 0 10*3/uL (ref 0.0–0.1)
Basophils Relative: 0 %
Eosinophils Absolute: 0.1 10*3/uL (ref 0.0–0.5)
Eosinophils Relative: 1 %
HCT: 46 % (ref 36.0–46.0)
Hemoglobin: 15 g/dL (ref 12.0–15.0)
Immature Granulocytes: 0 %
Lymphocytes Relative: 30 %
Lymphs Abs: 2 10*3/uL (ref 0.7–4.0)
MCH: 31.7 pg (ref 26.0–34.0)
MCHC: 32.6 g/dL (ref 30.0–36.0)
MCV: 97.3 fL (ref 80.0–100.0)
Monocytes Absolute: 0.4 10*3/uL (ref 0.1–1.0)
Monocytes Relative: 6 %
Neutro Abs: 4.2 10*3/uL (ref 1.7–7.7)
Neutrophils Relative %: 63 %
Platelets: 265 10*3/uL (ref 150–400)
RBC: 4.73 MIL/uL (ref 3.87–5.11)
RDW: 12.4 % (ref 11.5–15.5)
WBC: 6.7 10*3/uL (ref 4.0–10.5)
nRBC: 0 % (ref 0.0–0.2)

## 2019-05-01 LAB — LIPASE, BLOOD: Lipase: 35 U/L (ref 11–51)

## 2019-05-01 MED ORDER — SODIUM CHLORIDE (PF) 0.9 % IJ SOLN
INTRAMUSCULAR | Status: AC
  Filled 2019-05-01: qty 50

## 2019-05-01 MED ORDER — IOHEXOL 9 MG/ML PO SOLN
ORAL | Status: AC
  Filled 2019-05-01: qty 500

## 2019-05-01 MED ORDER — ONDANSETRON HCL 4 MG/2ML IJ SOLN
4.0000 mg | Freq: Once | INTRAMUSCULAR | Status: AC
  Administered 2019-05-01: 4 mg via INTRAVENOUS
  Filled 2019-05-01: qty 2

## 2019-05-01 MED ORDER — IOHEXOL 300 MG/ML  SOLN
100.0000 mL | Freq: Once | INTRAMUSCULAR | Status: AC | PRN
  Administered 2019-05-01: 16:00:00 100 mL via INTRAVENOUS

## 2019-05-01 MED ORDER — OXYCODONE-ACETAMINOPHEN 5-325 MG PO TABS
1.0000 | ORAL_TABLET | Freq: Once | ORAL | Status: AC
  Administered 2019-05-01: 14:00:00 1 via ORAL
  Filled 2019-05-01: qty 1

## 2019-05-01 MED ORDER — IOHEXOL 9 MG/ML PO SOLN
500.0000 mL | ORAL | Status: DC
  Administered 2019-05-01: 500 mL via ORAL

## 2019-05-01 NOTE — ED Provider Notes (Signed)
[facility] DEPT Provider Note   CSN: [id number] Arrival date & time: 05/01/19  1303     History   Chief Complaint Chief Complaint  Patient presents with  . Abdominal Pain    HPI Suzanne Gutierrez is a 53 y.o. female.  Presents to the [facility] department chief complaint of abdominal pain.  Past medical history diabetes, morbid obesity s/p gastric bypass 2019, Roux-en-Y.  States pain started approximately 12:00, sharp, stabbing, left mid to left lower abdomen.  Does not radiate to groin.  Seems to be crampy, initially severe, now mild to moderate severity.  Has not taken any pain medication for this, no associated vomiting or nausea.  No fevers.    HPI  Past Medical History:  Diagnosis Date  . Bipolar disorder [facility])    patient denies; reports only having post partum depression 21 years ago   . Diabetes mellitus without complication ([place])   . Elevated temperature    at PAT appt 99.3 ; denies fevers at home nor cold/flu sx; reports hot flahses due to menopause   . GERD (gastroesophageal reflux disease)   . Headache    Migraine  . High cholesterol    per patient report   . Hypertension   . Lichen sclerosus of female genitalia   . Morbid obesity with BMI of 40.0-44.9, adult ([place])    Has been in the process of evaluation for GOP  . Osteoarthritis   . PONV (postoperative nausea and vomiting)     Patient Active Problem List   Diagnosis Date Noted  . Migraine 03/27/2018  . Morbid obesity with BMI of 40.0-44.9, adult ([place]) 12/25/2017  . Costochondritis 08/08/2017  . Diabetes mellitus without complication ([place]) [id number]  . Essential hypertension 07/24/2017  . Left-sided chest wall pain 07/21/2017  . DOE (dyspnea on exertion) 07/21/2017  . Chest pain 07/21/2017  . Abnormal finding on EKG 07/21/2017  . Hemorrhoids 06/07/2017  . Seasonal allergies 04/09/2011    Past Surgical History:  Procedure Laterality Date  . APPENDECTOMY  1994  . BIOPSY   07/13/2018   Procedure: BIOPSY;  Surgeon: Alphonsa Overall, MD;  Location: [facility];  Service: General;;  . CARDIAC CATHETERIZATION  2016   [facility] --was told she had no significant disease.  Marland Kitchen CARDIAC CATHETERIZATION  2019   reports she had this done at [facility] at the same time as ECHO   . CESAREAN SECTION    . ESOPHAGOGASTRODUODENOSCOPY (EGD) WITH PROPOFOL N/A 07/13/2018   Procedure: ESOPHAGOGASTRODUODENOSCOPY (EGD) WITH PROPOFOL;  Surgeon: Alphonsa Overall, MD;  Location: [facility];  Service: General;  Laterality: N/A;  . GASTRIC ROUX-EN-Y N/A 12/25/2017   Procedure: LAPAROSCOPIC ROUX-EN-Y GASTRIC BYPASS WITH UPPER ENDOSCOPY, ERAS PATHWAY;  Surgeon: Excell Seltzer, MD;  Location: [facility];  Service: General;  Laterality: N/A;  . HERNIA REPAIR     X2  . NM MYOVIEW LTD  07/27/2017    LOW risk.  EF 67%.  No reversible perfusion noted.  There is a fixed defect in the lateral wall toward the apex likely breast attenuation.  . TRANSTHORACIC ECHOCARDIOGRAM  07/27/2017   No regional wall motion abnormality.  (Poor quality).  Mild RV dilation.  Mild LA dilation.  . TUBAL LIGATION    . WISDOM TOOTH EXTRACTION       OB History    Gravida  2   Para  2   Term  2   Preterm      AB  Living  2     SAB      TAB      Ectopic      Multiple      Live Births               Home Medications    Prior to Admission medications   Medication Sig Start Date End Date Taking? Authorizing Provider  acetaminophen (TYLENOL) 500 MG tablet Take 1,000-1,500 mg by mouth 2 (two) times daily as needed for moderate pain or headache.    [provider]  ALPRAZolam Duanne Moron) 0.25 MG tablet Take 0.25 mg by mouth daily as needed for anxiety.  01/23/18   [provider]  Calcium Carb-Cholecalciferol (CALCIUM 600 + D PO) Take 1 tablet by mouth 3 (three) times daily.    [provider]  clobetasol cream (TEMOVATE) [id number] % Apply 1 application topically 2  (two) times a week. Apply to vulva for lichen sclerosus    [provider]  escitalopram (LEXAPRO) 10 MG tablet Take 10 mg by mouth at bedtime.     [provider]  estradiol (CLIMARA - DOSED IN MG/24 HR) 0.05 mg/24hr patch Place 0.05 mg onto the skin every Wednesday.    [provider]  lidocaine (XYLOCAINE) 5 % ointment Apply 1 application topically daily as needed for mild pain. For numbing an area 07/24/17   [provider]  Multiple Vitamin (MULTIVITAMIN WITH MINERALS) TABS tablet Take 1 tablet by mouth daily.     [provider]  pantoprazole (PROTONIX) 40 MG tablet Take 1 tablet (40 mg total) by mouth daily. 07/13/18   Alphonsa Overall, MD  progesterone (PROMETRIUM) 200 MG capsule Take 200 mg by mouth every evening.    [provider]  promethazine (PHENERGAN) 25 MG tablet Take 25 mg by mouth every 6 (six) hours as needed for nausea or vomiting.    [provider]  rizatriptan (MAXALT) 10 MG tablet Take 10 mg by mouth every 2 (two) hours as needed for migraine. 10/10/17   [provider]  traMADol (ULTRAM) 50 MG tablet Take 50 mg by mouth every 6 (six) hours as needed (migraines).  03/16/18   [provider]    Family History Family History  Problem Relation Age of Onset  . COPD Other   . Cancer Other   . Hypertension Other   . Stroke Other   . Diabetes Other   . Dementia Mother   . Diabetes Father        With diabetic retinopathy  . Stroke Father   . Stroke Sister   . Other Brother        3 brothers with unknown history    Social History Social History   Tobacco Use  . Smoking status: Never Smoker  . Smokeless tobacco: Never Used  Substance Use Topics  . Alcohol use: Yes    Alcohol/week: 2.0 standard drinks    Types: 2 Glasses of wine per week  . Drug use: No     Allergies   Codeine   Review of Systems Review of Systems  Constitutional: Negative for chills and fever.  HENT: Negative  for ear pain and sore throat.   Eyes: Negative for pain and visual disturbance.  Respiratory: Negative for cough and shortness of breath.   Cardiovascular: Negative for chest pain and palpitations.  Gastrointestinal: Positive for abdominal pain. Negative for vomiting.  Genitourinary: Negative for dysuria and hematuria.  Musculoskeletal: Negative for arthralgias and back pain.  Skin: Negative for color change and rash.  Neurological: Negative for seizures and syncope.  All other systems reviewed and are negative.    Physical Exam Updated Vital Signs BP 140/88 (BP Location: Left Arm)   Pulse 67   Temp 98.1 F (36.7 C) (Oral)   Resp 16   SpO2 100%   Physical Exam Vitals signs and nursing note reviewed.  Constitutional:      General: She is not in acute distress.    Appearance: She is well-developed.  HENT:     Head: Normocephalic and atraumatic.  Eyes:     Conjunctiva/sclera: Conjunctivae normal.  Neck:     Musculoskeletal: Neck supple.  Cardiovascular:     Rate and Rhythm: Normal rate and regular rhythm.     Heart sounds: No murmur.  Pulmonary:     Effort: Pulmonary effort is normal. No respiratory distress.     Breath sounds: Normal breath sounds.  Abdominal:     Palpations: Abdomen is soft.     Comments: Soft, no rebound or guarding but did note mild tenderness in the left mid abdomen, left lower quadrant, no tenderness in left upper quadrant  Skin:    General: Skin is warm and dry.  Neurological:     Mental Status: She is alert.      ED Treatments / Results  Labs (all labs ordered are listed, but only abnormal results are displayed) Labs Reviewed  CBC WITH DIFFERENTIAL/PLATELET  COMPREHENSIVE METABOLIC PANEL  LIPASE, BLOOD  URINALYSIS, ROUTINE W REFLEX MICROSCOPIC    EKG None  Radiology No results found.  Procedures Procedures (including critical care time)  Medications Ordered in ED Medications  oxyCODONE-acetaminophen (PERCOCET/ROXICET) 5-325  MG per tablet 1 tablet (1 tablet Oral Given 05/01/19 1429)  ondansetron (ZOFRAN) injection 4 mg (4 mg Intravenous Given 05/01/19 1430)     Initial Impression / Assessment and Plan / ED Course  I have reviewed the triage vital signs and the nursing notes.  Pertinent labs & imaging results that were available during my care of the patient were reviewed by me and considered in my medical decision making (see chart for details).        53 year old lady with notable history for gastric bypass, Roux-en-Y surgery presents to ER with acute onset of left-sided abdominal pain.  Labs within normal limits, CT scan ordered to further evaluate.  While awaiting CT scan, patient signed out to Dr. Tyrone Nine, please refer to his note for final plan disposition.  If CT negative, anticipate patient can be discharged home with outpatient follow-up.  Final Clinical Impressions(s) / ED Diagnoses   Final diagnoses:  Left lower quadrant abdominal pain    ED Discharge Orders    None       Lucrezia Starch, MD 05/01/19 1526

## 2019-05-01 NOTE — ED Triage Notes (Signed)
Pt c/o LLQ pains that started an hour ago that is intermittent. Reports had gastric bypass last year. Was told that Dr Lucia Gaskins is on call today.

## 2019-05-01 NOTE — ED Notes (Signed)
Computer would not load in room for pt to sign. Pt verbalized understanding of d/c instructions.

## 2019-05-01 NOTE — ED Provider Notes (Signed)
53 yo F with a chief complaints of left-sided abdominal pain.  She had an episode like this couple weeks ago and had a CT scan that was unremarkable.  Patient had a recurrent episode today and called her surgeon and suggested she come into the ED for evaluation.  I see the patient in signout from Dr. Roslynn Amble.  Pending CT.  CT scan without an obvious cause of the patient's pain.  It was documented in the read that the patient had a significant stool burden the left lower quadrant.  Since this is the area of the patient's symptoms we will have her attempt a cleanout at home.  Have her discussed with your surgeon.   Deno Etienne, DO 05/01/19 1655

## 2019-05-01 NOTE — Discharge Instructions (Addendum)
Recommend taking Tylenol as needed for pain control.  Recommend follow-up with your general surgery office tomorrow or Friday for close recheck given the symptoms you are experiencing today.  If you develop worsening pain, vomiting, fever or other new concerning symptom, return to ER for reassessment.  Take 8 scoops of miralax in 32oz of whatever you would like to drink.(Gatorade comes in this size) You can also use a fleets enema which you can buy over the counter at the pharmacy.  Return for worsening abdominal pain, vomiting or fever.

## 2019-05-01 NOTE — ED Notes (Signed)
Pt ambulatory to bathroom independently

## 2019-05-31 HISTORY — PX: OTHER SURGICAL HISTORY: SHX169

## 2019-07-12 ENCOUNTER — Other Ambulatory Visit: Payer: Self-pay | Admitting: Student

## 2019-07-12 DIAGNOSIS — R110 Nausea: Secondary | ICD-10-CM

## 2019-07-17 ENCOUNTER — Other Ambulatory Visit: Payer: Self-pay | Admitting: Student

## 2019-07-17 ENCOUNTER — Ambulatory Visit
Admission: RE | Admit: 2019-07-17 | Discharge: 2019-07-17 | Disposition: A | Discharge status: Home / Self Care | Payer: 59 | Source: Ambulatory Visit | Attending: Student | Admitting: Student

## 2019-07-17 DIAGNOSIS — R110 Nausea: Secondary | ICD-10-CM

## 2019-07-30 ENCOUNTER — Other Ambulatory Visit: Payer: Self-pay | Admitting: Obstetrics & Gynecology

## 2019-07-30 DIAGNOSIS — Z1231 Encounter for screening mammogram for malignant neoplasm of breast: Secondary | ICD-10-CM

## 2019-08-21 ENCOUNTER — Encounter: Payer: Self-pay | Admitting: Physician Assistant

## 2019-08-21 ENCOUNTER — Other Ambulatory Visit: Payer: Self-pay | Admitting: Physician Assistant

## 2019-08-21 ENCOUNTER — Ambulatory Visit (INDEPENDENT_AMBULATORY_CARE_PROVIDER_SITE_OTHER): Payer: 59 | Admitting: Physician Assistant

## 2019-08-21 ENCOUNTER — Other Ambulatory Visit: Payer: Self-pay

## 2019-08-21 DIAGNOSIS — Z872 Personal history of diseases of the skin and subcutaneous tissue: Secondary | ICD-10-CM

## 2019-08-21 DIAGNOSIS — M71341 Other bursal cyst, right hand: Secondary | ICD-10-CM

## 2019-08-21 DIAGNOSIS — L03012 Cellulitis of left finger: Secondary | ICD-10-CM

## 2019-08-21 DIAGNOSIS — M67449 Ganglion, unspecified hand: Secondary | ICD-10-CM

## 2019-08-21 MED ORDER — CLOTRIMAZOLE-BETAMETHASONE 1-0.05 % EX CREA: 1.0000 "application " | TOPICAL_CREAM | Freq: Two times a day (BID) | CUTANEOUS | 0 refills | Status: AC

## 2019-08-21 MED ORDER — MUPIROCIN CALCIUM 2 % EX CREA: 1.0000 "application " | TOPICAL_CREAM | Freq: Two times a day (BID) | CUTANEOUS | 0 refills | Status: DC

## 2019-08-21 NOTE — Progress Notes (Signed)
   Follow up Visit  Subjective  Suzanne Gutierrez is a 54 y.o. female who presents for the following: Nail Problem (Patient stated nail fungus present for 1-2 months multiple fingers on both hands). She has two fingers that are a problem and in different ways. The right 3rd has a bump that has clear jelly in it that comes up along her cuticle and has caused an indentation in the nail. Gets sore and inflamed and ruptures on its own.  The left 2nd finger has inflamed skin around the cuticle back to the DIP and a loss of cuticle. She has a history of lichen sclerosis and at her last visit 07/2018 we biopsied two precancers from her right forehead and left nose. She says these areas have cleared. She is continuing to be seen at [facility] for her LS she has had so much atrophy they are considering starting an estorgen cream and possible Josph Macho laser procedure.   Objective  Well appearing patient in no apparent distress; mood and affect are within normal limits.  A focused examination was performed including Fingers and fingernails and face.  Relevant physical exam findings are noted in the Assessment and Plan. No suspicious moles noted on back.    Assessment & Plan  Digital mucous cyst of finger Right 3rd Finger Nail Plate  Paronychia of finger, left Left Distal 2nd Finger  Aks face biopsied 2020 appear clear.

## 2019-08-27 DIAGNOSIS — M67449 Ganglion, unspecified hand: Secondary | ICD-10-CM | POA: Insufficient documentation

## 2019-09-13 DIAGNOSIS — L900 Lichen sclerosus et atrophicus: Secondary | ICD-10-CM | POA: Insufficient documentation

## 2019-11-29 ENCOUNTER — Encounter: Payer: Self-pay | Admitting: Emergency Medicine

## 2019-11-29 ENCOUNTER — Ambulatory Visit
Admission: EM | Admit: 2019-11-29 | Discharge: 2019-11-29 | Disposition: A | Discharge status: Home / Self Care | Payer: 59 | Attending: Emergency Medicine | Admitting: Emergency Medicine

## 2019-11-29 ENCOUNTER — Other Ambulatory Visit: Payer: Self-pay

## 2019-11-29 DIAGNOSIS — G44209 Tension-type headache, unspecified, not intractable: Secondary | ICD-10-CM | POA: Diagnosis not present

## 2019-11-29 MED ORDER — DEXAMETHASONE SODIUM PHOSPHATE 10 MG/ML IJ SOLN
10.0000 mg | Freq: Once | INTRAMUSCULAR | Status: AC
  Administered 2019-11-29: 10 mg via INTRAMUSCULAR

## 2019-11-29 MED ORDER — ONDANSETRON 4 MG PO TBDP
4.0000 mg | ORAL_TABLET | Freq: Once | ORAL | Status: AC
  Administered 2019-11-29: 4 mg via ORAL

## 2019-11-29 MED ORDER — KETOROLAC TROMETHAMINE 30 MG/ML IJ SOLN
30.0000 mg | Freq: Once | INTRAMUSCULAR | Status: AC
  Administered 2019-11-29: 30 mg via INTRAMUSCULAR

## 2019-11-29 NOTE — ED Triage Notes (Signed)
Pt presents to [facility] for assessment of headache/neck pain to right posterior head which started suddenly 1 week ago.  Patient states pain increases with head movement or palpation.  C/o nausea, vomiting (x 1).  Denies changes in vision, denies fevers but complains of chills, c/o dry mouth.

## 2019-11-29 NOTE — Discharge Instructions (Signed)
Recommend you go to ER for further evaluation if your headache does not improve, or gets worse on such as you develop chest pain, difficulty breathing, numbness, weakness, lightheadedness, dizziness, emesis. Recommend you keep a headache log, follow-up with PCP in 1-2 weeks for further evaluation.

## 2019-11-29 NOTE — ED Provider Notes (Signed)
[name]    CSN: [id number] Arrival date & time: 11/29/19  1602      History   Chief Complaint Chief Complaint  Patient presents with  . Headache    HPI Suzanne Gutierrez is a 54 y.o. female with history of obesity, hypertension, migraines, bipolar disorder, diabetes, OA presenting for right-sided posterior headache.  States it began roughly week ago, though has been worse last for days.  Denies trauma, inciting event, increased stress level.  No change in diet, lifestyle, medications.  Did have single episode of nausea and vomiting without bile or blood.  No abdominal pain, change in urination or bowel habit.  No change in vision, hearing, tinnitus, weakness, numbness, chest pain, difficulty breathing.  Denies worst headache of life, thunderclap headache.   Past Medical History:  Diagnosis Date  . Bipolar disorder [facility])    patient denies; reports only having post partum depression 21 years ago   . Diabetes mellitus without complication ([place])   . Elevated temperature    at PAT appt 99.3 ; denies fevers at home nor cold/flu sx; reports hot flahses due to menopause   . GERD (gastroesophageal reflux disease)   . Headache    Migraine  . High cholesterol    per patient report   . Hypertension   . Lichen sclerosus of female genitalia   . Morbid obesity with BMI of 40.0-44.9, adult ([place])    Has been in the process of evaluation for GOP  . Osteoarthritis   . PONV (postoperative nausea and vomiting)     Patient Active Problem List   Diagnosis Date Noted  . Migraine 03/27/2018  . Morbid obesity with BMI of 40.0-44.9, adult ([place]) 12/25/2017  . Costochondritis 08/08/2017  . Diabetes mellitus without complication ([place]) 24/01/7352  . Essential hypertension 07/24/2017  . Left-sided chest wall pain 07/21/2017  . DOE (dyspnea on exertion) 07/21/2017  . Chest pain 07/21/2017  . Abnormal finding on EKG 07/21/2017  . Hemorrhoids 06/07/2017  . Seasonal allergies  04/09/2011    Past Surgical History:  Procedure Laterality Date  . APPENDECTOMY  1994  . BIOPSY  07/13/2018   Procedure: BIOPSY;  Surgeon: Alphonsa Overall, MD;  Location: [facility];  Service: General;;  . CARDIAC CATHETERIZATION  2016   [facility] --was told she had no significant disease.  Marland Kitchen CARDIAC CATHETERIZATION  2019   reports she had this done at [facility] at the same time as ECHO   . CESAREAN SECTION    . ESOPHAGOGASTRODUODENOSCOPY (EGD) WITH PROPOFOL N/A 07/13/2018   Procedure: ESOPHAGOGASTRODUODENOSCOPY (EGD) WITH PROPOFOL;  Surgeon: Alphonsa Overall, MD;  Location: [facility];  Service: General;  Laterality: N/A;  . GASTRIC ROUX-EN-Y N/A 12/25/2017   Procedure: LAPAROSCOPIC ROUX-EN-Y GASTRIC BYPASS WITH UPPER ENDOSCOPY, ERAS PATHWAY;  Surgeon: Excell Seltzer, MD;  Location: [facility];  Service: General;  Laterality: N/A;  . HERNIA REPAIR     X2  . NM MYOVIEW LTD  07/27/2017    LOW risk.  EF 67%.  No reversible perfusion noted.  There is a fixed defect in the lateral wall toward the apex likely breast attenuation.  . TRANSTHORACIC ECHOCARDIOGRAM  07/27/2017   No regional wall motion abnormality.  (Poor quality).  Mild RV dilation.  Mild LA dilation.  . TUBAL LIGATION    . WISDOM TOOTH EXTRACTION      OB History    Gravida  2   Para  2   Term  2   Preterm  AB      Living  2     SAB      TAB      Ectopic      Multiple      Live Births               Home Medications    Prior to Admission medications   Medication Sig Start Date End Date Taking? Authorizing Provider  ALPRAZolam Duanne Moron) 0.25 MG tablet Take 0.25 mg by mouth daily as needed for anxiety.  01/23/18   [provider]  Calcium Carb-Cholecalciferol (CALCIUM 600 + D PO) Take 1 tablet by mouth 3 (three) times daily.    [provider]  clotrimazole-betamethasone (LOTRISONE) cream Apply 1 application topically 2 (two) times daily. 08/21/19   Clark-Bruning,  Anderson Malta, PA-C  escitalopram (LEXAPRO) 10 MG tablet Take 10 mg by mouth at bedtime.     [provider]  lidocaine (XYLOCAINE) 5 % ointment Apply 1 application topically daily as needed for mild pain. For numbing an area 07/24/17   [provider]  Multiple Vitamin (MULTIVITAMIN WITH MINERALS) TABS tablet Take 1 tablet by mouth daily.     [provider]  mupirocin ointment (BACTROBAN) 2 % Please specify directions, refills and quantity 08/21/19   Clark-Bruning, Anderson Malta, PA-C  pantoprazole (PROTONIX) 40 MG tablet Take 1 tablet (40 mg total) by mouth daily. 07/13/18   Alphonsa Overall, MD  progesterone (PROMETRIUM) 200 MG capsule Take 200 mg by mouth every evening.    [provider]  promethazine (PHENERGAN) 25 MG tablet Take 25 mg by mouth every 6 (six) hours as needed for nausea or vomiting.    [provider]  rizatriptan (MAXALT) 10 MG tablet Take 10 mg by mouth every 2 (two) hours as needed for migraine. 10/10/17   [provider]  traMADol (ULTRAM) 50 MG tablet Take 50 mg by mouth every 6 (six) hours as needed (migraines).  03/16/18   [provider]    Family History Family History  Problem Relation Age of Onset  . COPD Other   . Cancer Other   . Hypertension Other   . Stroke Other   . Diabetes Other   . Dementia Mother   . Diabetes Father        With diabetic retinopathy  . Stroke Father   . Stroke Sister   . Other Brother        3 brothers with unknown history    Social History Social History   Tobacco Use  . Smoking status: Never Smoker  . Smokeless tobacco: Never Used  Substance Use Topics  . Alcohol use: Yes    Alcohol/week: 2.0 standard drinks    Types: 2 Glasses of wine per week  . Drug use: No     Allergies   Codeine and Nsaids   Review of Systems As per HPI   Physical Exam Triage Vital Signs ED Triage Vitals  Enc Vitals Group     BP      Pulse      Resp      Temp      Temp src       SpO2      Weight      Height      Head Circumference      Peak Flow      Pain Score      Pain Loc      Pain Edu?      Excl. in  [place]?    No data found.  Updated Vital Signs BP 134/90 (BP Location: Right Arm)   Pulse 71   Temp 97.9 F (36.6 C) (Oral)   Resp 16   SpO2 97%   Visual Acuity Right Eye Distance:   Left Eye Distance:   Bilateral Distance:    Right Eye Near:   Left Eye Near:    Bilateral Near:     Physical Exam Constitutional:      General: She is not in acute distress. HENT:     Head: Normocephalic and atraumatic.     Right Ear: Tympanic membrane, ear canal and external ear normal.     Left Ear: Tympanic membrane, ear canal and external ear normal.     Mouth/Throat:     Mouth: Mucous membranes are moist.     Pharynx: Oropharynx is clear.  Eyes:     General: No scleral icterus.    Extraocular Movements: Extraocular movements intact.     Conjunctiva/sclera: Conjunctivae normal.     Pupils: Pupils are equal, round, and reactive to light.  Cardiovascular:     Rate and Rhythm: Normal rate.  Pulmonary:     Effort: Pulmonary effort is normal. No respiratory distress.     Breath sounds: No wheezing.  Musculoskeletal:        General: No deformity. Normal range of motion.     Cervical back: Normal range of motion. No rigidity or tenderness.  Lymphadenopathy:     Cervical: No cervical adenopathy.  Skin:    Capillary Refill: Capillary refill takes less than 2 seconds.     Coloration: Skin is not jaundiced.     Findings: No bruising or rash.  Neurological:     Mental Status: She is alert.     Cranial Nerves: Cranial nerves are intact.     Sensory: Sensation is intact.     Motor: Motor function is intact.     Coordination: Coordination is intact.     Gait: Gait is intact.  Psychiatric:        Mood and Affect: Mood normal.        Behavior: Behavior normal.      UC Treatments / Results  Labs (all labs ordered are listed, but only abnormal results are  displayed) Labs Reviewed - No data to display  EKG   Radiology No results found.  Procedures Procedures (including critical care time)  Medications Ordered in UC Medications  ketorolac (TORADOL) 30 MG/ML injection 30 mg (30 mg Intramuscular Given 11/29/19 1700)  dexamethasone (DECADRON) injection 10 mg (10 mg Intramuscular Given 11/29/19 1657)  ondansetron (ZOFRAN-ODT) disintegrating tablet 4 mg (4 mg Oral Given 11/29/19 1655)    Initial Impression / Assessment and Plan / UC Course  I have reviewed the triage vital signs and the nursing notes.  Pertinent labs & imaging results that were available during my care of the patient were reviewed by me and considered in my medical decision making (see chart for details).     Patient febrile, nontoxic in office today.  No neurocognitive deficit on exam.  Given headache cocktail as outlined above which patient tolerated well.  Endorsing mild improvement of symptoms at time of discharge.  Will keep headache log, follow-up with PCP in 1 to 2 weeks for repeat evaluation.  ER return precautions discussed, patient verbalized understanding and is agreeable to plan. Final Clinical Impressions(s) / UC Diagnoses   Final diagnoses:  Tension headache     Discharge Instructions  Recommend you go to ER for further evaluation if your headache does not improve, or gets worse on such as you develop chest pain, difficulty breathing, numbness, weakness, lightheadedness, dizziness, emesis. Recommend you keep a headache log, follow-up with PCP in 1-2 weeks for further evaluation.    ED Prescriptions    None     PDMP not reviewed this encounter.   Hall-Potvin, [place], [place] 11/30/19 [phone]

## 2020-02-20 ENCOUNTER — Other Ambulatory Visit: Payer: Self-pay

## 2020-02-20 ENCOUNTER — Ambulatory Visit
Admission: EM | Admit: 2020-02-20 | Discharge: 2020-02-20 | Disposition: A | Discharge status: Home / Self Care | Payer: 59 | Attending: Emergency Medicine | Admitting: Emergency Medicine

## 2020-02-20 DIAGNOSIS — Z1152 Encounter for screening for COVID-19: Secondary | ICD-10-CM | POA: Diagnosis not present

## 2020-02-20 DIAGNOSIS — Z20822 Contact with and (suspected) exposure to covid-19: Secondary | ICD-10-CM | POA: Diagnosis not present

## 2020-02-20 MED ORDER — ONDANSETRON 4 MG PO TBDP: 4.0000 mg | ORAL_TABLET | Freq: Three times a day (TID) | ORAL | 0 refills | Status: DC | PRN

## 2020-02-20 MED ORDER — BENZONATATE 200 MG PO CAPS: 200.0000 mg | ORAL_CAPSULE | Freq: Three times a day (TID) | ORAL | 0 refills | Status: AC | PRN

## 2020-02-20 NOTE — ED Triage Notes (Signed)
Pt states on Sunday had a severe headache, Monday and Tuesday had bodyaches and fever. States today having same sx's with now a cough, scratchy throat, and chest tightness. Denies SOB. States her daughter was tested covid positive today.

## 2020-02-20 NOTE — ED Provider Notes (Signed)
[name]    CSN: [id number] Arrival date & time: 02/20/20  1817      History   Chief Complaint Chief Complaint  Patient presents with  . Cough    HPI Suzanne Gutierrez is a 54 y.o. female presenting today for evaluation of headache and URI symptoms/Covid exposure.  Patient reports that beginning Sunday evening she began to develop a migraine with associated body aches.  Is awake went on she had worsening aches nausea but her headache improved.  She has had subjective fevers and night sweats at times.  Developed cough sore throat and chest pressure today.  Recently found out that her daughter and attendees had a recent wedding are positive for Covid.   HPI  Past Medical History:  Diagnosis Date  . Bipolar disorder [facility])    patient denies; reports only having post partum depression 21 years ago   . Diabetes mellitus without complication ([place])   . Elevated temperature    at PAT appt 99.3 ; denies fevers at home nor cold/flu sx; reports hot flahses due to menopause   . GERD (gastroesophageal reflux disease)   . Headache    Migraine  . High cholesterol    per patient report   . Hypertension   . Lichen sclerosus of female genitalia   . Morbid obesity with BMI of 40.0-44.9, adult ([place])    Has been in the process of evaluation for GOP  . Osteoarthritis   . PONV (postoperative nausea and vomiting)     Patient Active Problem List   Diagnosis Date Noted  . Migraine 03/27/2018  . Morbid obesity with BMI of 40.0-44.9, adult ([place]) 12/25/2017  . Costochondritis 08/08/2017  . Diabetes mellitus without complication ([place]) 32/04/2481  . Essential hypertension 07/24/2017  . Left-sided chest wall pain 07/21/2017  . DOE (dyspnea on exertion) 07/21/2017  . Chest pain 07/21/2017  . Abnormal finding on EKG 07/21/2017  . Hemorrhoids 06/07/2017  . Seasonal allergies 04/09/2011    Past Surgical History:  Procedure Laterality Date  . APPENDECTOMY  1994  . BIOPSY   07/13/2018   Procedure: BIOPSY;  Surgeon: Alphonsa Overall, MD;  Location: [facility];  Service: General;;  . CARDIAC CATHETERIZATION  2016   [facility] --was told she had no significant disease.  Marland Kitchen CARDIAC CATHETERIZATION  2019   reports she had this done at [facility] at the same time as ECHO   . CESAREAN SECTION    . ESOPHAGOGASTRODUODENOSCOPY (EGD) WITH PROPOFOL N/A 07/13/2018   Procedure: ESOPHAGOGASTRODUODENOSCOPY (EGD) WITH PROPOFOL;  Surgeon: Alphonsa Overall, MD;  Location: [facility];  Service: General;  Laterality: N/A;  . GASTRIC ROUX-EN-Y N/A 12/25/2017   Procedure: LAPAROSCOPIC ROUX-EN-Y GASTRIC BYPASS WITH UPPER ENDOSCOPY, ERAS PATHWAY;  Surgeon: Excell Seltzer, MD;  Location[facility];  Service: General;  Laterality: N/A;  . HERNIA REPAIR     X2  . NM MYOVIEW LTD  07/27/2017    LOW risk.  EF 67%.  No reversible perfusion noted.  There is a fixed defect in the lateral wall toward the apex likely breast attenuation.  . TRANSTHORACIC ECHOCARDIOGRAM  07/27/2017   No regional wall motion abnormality.  (Poor quality).  Mild RV dilation.  Mild LA dilation.  . TUBAL LIGATION    . WISDOM TOOTH EXTRACTION      OB History    Gravida  2   Para  2   Term  2   Preterm      AB  Living  2     SAB      TAB      Ectopic      Multiple      Live Births               Home Medications    Prior to Admission medications   Medication Sig Start Date End Date Taking? Authorizing Provider  ALPRAZolam Duanne Moron) 0.25 MG tablet Take 0.25 mg by mouth daily as needed for anxiety.  01/23/18   [provider]  benzonatate (TESSALON) 200 MG capsule Take 1 capsule (200 mg total) by mouth 3 (three) times daily as needed for up to 7 days for cough. 02/20/20 02/27/20  Kayloni Rocco C, PA-C  Calcium Carb-Cholecalciferol (CALCIUM 600 + D PO) Take 1 tablet by mouth 3 (three) times daily.    [provider]  clotrimazole-betamethasone (LOTRISONE) cream  Apply 1 application topically 2 (two) times daily. 08/21/19   Clark-Bruning, Anderson Malta, PA-C  escitalopram (LEXAPRO) 10 MG tablet Take 10 mg by mouth at bedtime.     [provider]  lidocaine (XYLOCAINE) 5 % ointment Apply 1 application topically daily as needed for mild pain. For numbing an area 07/24/17   [provider]  Multiple Vitamin (MULTIVITAMIN WITH MINERALS) TABS tablet Take 1 tablet by mouth daily.     [provider]  mupirocin ointment (BACTROBAN) 2 % Please specify directions, refills and quantity 08/21/19   Clark-Bruning, Anderson Malta, PA-C  ondansetron (ZOFRAN ODT) 4 MG disintegrating tablet Take 1 tablet (4 mg total) by mouth every 8 (eight) hours as needed for nausea or vomiting. 02/20/20   Madelyne Millikan C, PA-C  pantoprazole (PROTONIX) 40 MG tablet Take 1 tablet (40 mg total) by mouth daily. 07/13/18   Alphonsa Overall, MD  progesterone (PROMETRIUM) 200 MG capsule Take 200 mg by mouth every evening.    [provider]  promethazine (PHENERGAN) 25 MG tablet Take 25 mg by mouth every 6 (six) hours as needed for nausea or vomiting.    [provider]  traMADol (ULTRAM) 50 MG tablet Take 50 mg by mouth every 6 (six) hours as needed (migraines).  03/16/18   [provider]    Family History Family History  Problem Relation Age of Onset  . COPD Other   . Cancer Other   . Hypertension Other   . Stroke Other   . Diabetes Other   . Dementia Mother   . Diabetes Father        With diabetic retinopathy  . Stroke Father   . Stroke Sister   . Other Brother        3 brothers with unknown history    Social History Social History   Tobacco Use  . Smoking status: Never Smoker  . Smokeless tobacco: Never Used  Substance Use Topics  . Alcohol use: Yes    Alcohol/week: 2.0 standard drinks    Types: 2 Glasses of wine per week  . Drug use: No     Allergies   Codeine and Nsaids   Review of Systems Review of Systems    Constitutional: Positive for chills, fatigue and fever. Negative for activity change and appetite change.  HENT: Positive for congestion, rhinorrhea and sore throat. Negative for ear pain, sinus pressure and trouble swallowing.   Eyes: Negative for discharge and redness.  Respiratory: Positive for cough. Negative for chest tightness and shortness of breath.   Cardiovascular: Negative for chest pain.  Gastrointestinal: Positive for nausea. Negative  for abdominal pain, diarrhea and vomiting.  Musculoskeletal: Negative for myalgias.  Skin: Negative for rash.  Neurological: Positive for headaches. Negative for dizziness and light-headedness.     Physical Exam Triage Vital Signs ED Triage Vitals [02/20/20 1903]  Enc Vitals Group     BP 131/81     Pulse Rate 64     Resp 18     Temp 98.9 F (37.2 C)     Temp Source Oral     SpO2 98 %     Weight      Height      Head Circumference      Peak Flow      Pain Score 0     Pain Loc      Pain Edu?      Excl. in [place]?    No data found.  Updated Vital Signs BP 131/81 (BP Location: Left Arm)   Pulse 64   Temp 98.9 F (37.2 C) (Oral)   Resp 18   LMP 08/28/2017 (Approximate)   SpO2 98%   Visual Acuity Right Eye Distance:   Left Eye Distance:   Bilateral Distance:    Right Eye Near:   Left Eye Near:    Bilateral Near:     Physical Exam Vitals and nursing note reviewed.  Constitutional:      Appearance: She is well-developed.     Comments: No acute distress  HENT:     Head: Normocephalic and atraumatic.     Ears:     Comments: Bilateral ears without tenderness to palpation of external auricle, tragus and mastoid, EAC's without erythema or swelling, TM's with good bony landmarks and cone of light. Non erythematous.     Nose: Nose normal.  Eyes:     Conjunctiva/sclera: Conjunctivae normal.  Cardiovascular:     Rate and Rhythm: Normal rate.  Pulmonary:     Effort: Pulmonary effort is normal. No respiratory distress.      Comments: Breathing comfortably at rest, CTABL, no wheezing, rales or other adventitious sounds auscultated  Abdominal:     General: There is no distension.  Musculoskeletal:        General: Normal range of motion.     Cervical back: Neck supple.  Skin:    General: Skin is warm and dry.  Neurological:     Mental Status: She is alert and oriented to person, place, and time.      UC Treatments / Results  Labs (all labs ordered are listed, but only abnormal results are displayed) Labs Reviewed  NOVEL CORONAVIRUS, NAA    EKG   Radiology No results found.  Procedures Procedures (including critical care time)  Medications Ordered in UC Medications - No data to display  Initial Impression / Assessment and Plan / UC Course  I have reviewed the triage vital signs and the nursing notes.  Pertinent labs & imaging results that were available during my care of the patient were reviewed by me and considered in my medical decision making (see chart for details).     Covid PCR pending, high suspicion of Covid given close exposure and symptoms.  Symptomatic and supportive care.  Vital signs stable without fever tachycardia or hypoxia.  Lungs clear to auscultation.  Discussed strict return precautions. Patient verbalized understanding and is agreeable with plan.  Final Clinical Impressions(s) / UC Diagnoses   Final diagnoses:  Encounter for screening for COVID-19  Suspected COVID-19 virus infection     Discharge Instructions  Covid test pending, monitor my chart for results Tylenol for fevers body aches headache Tessalon/benzonatate every 8 hours as needed for cough May take daily cetirizine/Zyrtec or loratadine/Claritin for congestion and postnasal drainage/throat irritation Zofran for nausea Rest and fluids Follow-up if not improving or worsening    ED Prescriptions    Medication Sig Dispense Auth. Provider   benzonatate (TESSALON) 200 MG capsule Take 1 capsule  (200 mg total) by mouth 3 (three) times daily as needed for up to 7 days for cough. 28 capsule Breaunna Gottlieb C, PA-C   ondansetron (ZOFRAN ODT) 4 MG disintegrating tablet Take 1 tablet (4 mg total) by mouth every 8 (eight) hours as needed for nausea or vomiting. 20 tablet Jamin Humphries, [place] C, PA-C     PDMP not reviewed this encounter.   Janith Lima, PA-C 02/21/20 1758

## 2020-02-20 NOTE — Discharge Instructions (Addendum)
Covid test pending, monitor my chart for results Tylenol for fevers body aches headache Tessalon/benzonatate every 8 hours as needed for cough May take daily cetirizine/Zyrtec or loratadine/Claritin for congestion and postnasal drainage/throat irritation Zofran for nausea Rest and fluids Follow-up if not improving or worsening

## 2020-02-22 LAB — NOVEL CORONAVIRUS, NAA: SARS-CoV-2, NAA: NOT DETECTED

## 2020-02-22 LAB — SARS-COV-2, NAA 2 DAY TAT

## 2020-02-26 ENCOUNTER — Other Ambulatory Visit: Payer: Self-pay | Admitting: Oncology

## 2020-02-26 ENCOUNTER — Encounter: Payer: Self-pay | Admitting: Oncology

## 2020-02-26 DIAGNOSIS — U071 COVID-19: Secondary | ICD-10-CM

## 2020-02-26 NOTE — Progress Notes (Signed)
I connected by phone with Suzanne Gutierrez to discuss the potential use of an new treatment for mild to moderate COVID-19 viral infection in non-hospitalized patients.   This patient is a age/sex that meets the FDA criteria for Emergency Use Authorization of casirivimab\imdevimab.  Has a (+) direct SARS-CoV-2 viral test result 1. Has mild or moderate COVID-19  2. Is ? 54 years of age and weighs ? 40 kg 3. Is NOT hospitalized due to COVID-19 4. Is NOT requiring oxygen therapy or requiring an increase in baseline oxygen flow rate due to COVID-19 5. Is within 10 days of symptom onset 6. Has at least one of the high risk factor(s) for progression to severe COVID-19 and/or hospitalization as defined in EUA. Specific high risk criteria : Past Medical History:  Diagnosis Date  . Bipolar disorder [facility])    patient denies; reports only having post partum depression 21 years ago   . Diabetes mellitus without complication ([place])   . Elevated temperature    at PAT appt 99.3 ; denies fevers at home nor cold/flu sx; reports hot flahses due to menopause   . GERD (gastroesophageal reflux disease)   . Headache    Migraine  . High cholesterol    per patient report   . Hypertension   . Lichen sclerosus of female genitalia   . Morbid obesity with BMI of 40.0-44.9, adult ([place])    Has been in the process of evaluation for GOP  . Osteoarthritis   . PONV (postoperative nausea and vomiting)   ?  ?    Symptom onset  02/21/20   I have spoken and communicated the following to the patient or parent/caregiver:   1. FDA has authorized the emergency use of casirivimab\imdevimab for the treatment of mild to moderate COVID-19 in adults and pediatric patients with positive results of direct SARS-CoV-2 viral testing who are 39 years of age and older weighing at least 40 kg, and who are at high risk for progressing to severe COVID-19 and/or hospitalization.   2. The significant known and potential risks and benefits of  casirivimab\imdevimab, and the extent to which such potential risks and benefits are unknown.   3. Information on available alternative treatments and the risks and benefits of those alternatives, including clinical trials.   4. Patients treated with casirivimab\imdevimab should continue to self-isolate and use infection control measures (e.g., wear mask, isolate, social distance, avoid sharing personal items, clean and disinfect "high touch" surfaces, and frequent handwashing) according to CDC guidelines.    5. The patient or parent/caregiver has the option to accept or refuse casirivimab\imdevimab .   After reviewing this information with the patient, The patient agreed to proceed with receiving casirivimab\imdevimab infusion and will be provided a copy of the Fact sheet prior to receiving the infusion.Rulon Abide, AGNP-C [phone] ([place])

## 2020-02-27 ENCOUNTER — Ambulatory Visit (HOSPITAL_COMMUNITY)
Admission: RE | Admit: 2020-02-27 | Discharge: 2020-02-27 | Disposition: A | Discharge status: Home / Self Care | Payer: 59 | Source: Ambulatory Visit | Attending: Pulmonary Disease | Admitting: Pulmonary Disease

## 2020-02-27 ENCOUNTER — Other Ambulatory Visit (HOSPITAL_COMMUNITY): Payer: Self-pay

## 2020-02-27 DIAGNOSIS — U071 COVID-19: Secondary | ICD-10-CM | POA: Insufficient documentation

## 2020-02-27 MED ORDER — EPINEPHRINE 0.3 MG/0.3ML IJ SOAJ: 0.3000 mg | Freq: Once | INTRAMUSCULAR | Status: DC | PRN

## 2020-02-27 MED ORDER — METHYLPREDNISOLONE SODIUM SUCC 125 MG IJ SOLR: 125.0000 mg | Freq: Once | INTRAMUSCULAR | Status: DC | PRN

## 2020-02-27 MED ORDER — FAMOTIDINE IN NACL 20-0.9 MG/50ML-% IV SOLN: 20.0000 mg | Freq: Once | INTRAVENOUS | Status: DC | PRN

## 2020-02-27 MED ORDER — SODIUM CHLORIDE 0.9 % IV SOLN
1200.0000 mg | Freq: Once | INTRAVENOUS | Status: AC
  Administered 2020-02-27: 1200 mg via INTRAVENOUS

## 2020-02-27 MED ORDER — ALBUTEROL SULFATE HFA 108 (90 BASE) MCG/ACT IN AERS: 2.0000 | INHALATION_SPRAY | Freq: Once | RESPIRATORY_TRACT | Status: DC | PRN

## 2020-02-27 MED ORDER — SODIUM CHLORIDE 0.9 % IV SOLN: INTRAVENOUS | Status: DC | PRN

## 2020-02-27 MED ORDER — DIPHENHYDRAMINE HCL 50 MG/ML IJ SOLN: 50.0000 mg | Freq: Once | INTRAMUSCULAR | Status: DC | PRN

## 2020-02-27 NOTE — Progress Notes (Signed)
  Diagnosis: COVID-19  Physician: Dr. Wright  Procedure: Covid Infusion Clinic Med: casirivimab\imdevimab infusion - Provided patient with casirivimab\imdevimab fact sheet for patients, parents and caregivers prior to infusion.  Complications: No immediate complications noted.  Discharge: Discharged home   Kerina Simoneau Ann 02/27/2020  

## 2020-02-27 NOTE — Discharge Instructions (Signed)

## 2020-04-14 ENCOUNTER — Other Ambulatory Visit: Payer: Self-pay | Admitting: Obstetrics & Gynecology

## 2020-04-14 ENCOUNTER — Other Ambulatory Visit: Payer: Self-pay

## 2020-04-14 ENCOUNTER — Ambulatory Visit
Admission: RE | Admit: 2020-04-14 | Discharge: 2020-04-14 | Disposition: A | Discharge status: Home / Self Care | Payer: 59 | Source: Ambulatory Visit | Attending: Obstetrics & Gynecology | Admitting: Obstetrics & Gynecology

## 2020-04-14 DIAGNOSIS — Z1231 Encounter for screening mammogram for malignant neoplasm of breast: Secondary | ICD-10-CM

## 2020-04-16 DIAGNOSIS — R200 Anesthesia of skin: Secondary | ICD-10-CM | POA: Insufficient documentation

## 2020-04-17 ENCOUNTER — Other Ambulatory Visit: Payer: Self-pay | Admitting: Obstetrics & Gynecology

## 2020-04-17 DIAGNOSIS — R928 Other abnormal and inconclusive findings on diagnostic imaging of breast: Secondary | ICD-10-CM

## 2020-05-01 ENCOUNTER — Ambulatory Visit: Payer: 59

## 2020-05-01 ENCOUNTER — Other Ambulatory Visit: Payer: Self-pay

## 2020-05-01 ENCOUNTER — Ambulatory Visit
Admission: RE | Admit: 2020-05-01 | Discharge: 2020-05-01 | Disposition: A | Discharge status: Home / Self Care | Payer: 59 | Source: Ambulatory Visit | Attending: Obstetrics & Gynecology | Admitting: Obstetrics & Gynecology

## 2020-05-01 DIAGNOSIS — R928 Other abnormal and inconclusive findings on diagnostic imaging of breast: Secondary | ICD-10-CM

## 2020-05-24 ENCOUNTER — Ambulatory Visit
Admission: EM | Admit: 2020-05-24 | Discharge: 2020-05-24 | Disposition: A | Discharge status: Home / Self Care | Payer: 59 | Attending: Emergency Medicine | Admitting: Emergency Medicine

## 2020-05-24 ENCOUNTER — Other Ambulatory Visit: Payer: Self-pay

## 2020-05-24 ENCOUNTER — Ambulatory Visit (INDEPENDENT_AMBULATORY_CARE_PROVIDER_SITE_OTHER): Payer: 59

## 2020-05-24 DIAGNOSIS — S299XXA Unspecified injury of thorax, initial encounter: Secondary | ICD-10-CM

## 2020-05-24 DIAGNOSIS — R0781 Pleurodynia: Secondary | ICD-10-CM | POA: Diagnosis not present

## 2020-05-24 MED ORDER — HYDROCODONE-ACETAMINOPHEN 5-325 MG PO TABS
1.0000 | ORAL_TABLET | Freq: Four times a day (QID) | ORAL | 0 refills | Status: DC | PRN
Start: 2020-05-24 — End: 2021-03-02
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
## 2020-05-24 NOTE — Discharge Instructions (Addendum)
X-ray negative for any rib fractures Tylenol 1000 mg every 4-6 hours Supple with hydrocodone for severe pain as needed Alternate ice and heat Follow-up if not improving or worsening, developing cough shortness of breath or fevers
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
## 2020-10-05 DIAGNOSIS — Z6827 Body mass index (BMI) 27.0-27.9, adult: Secondary | ICD-10-CM | POA: Insufficient documentation

## 2020-10-05 DIAGNOSIS — M545 Low back pain, unspecified: Secondary | ICD-10-CM | POA: Insufficient documentation

## 2020-10-05 DIAGNOSIS — R030 Elevated blood-pressure reading, without diagnosis of hypertension: Secondary | ICD-10-CM | POA: Insufficient documentation

## 2020-10-13 DIAGNOSIS — M479 Spondylosis, unspecified: Secondary | ICD-10-CM | POA: Insufficient documentation

## 2020-11-16 DIAGNOSIS — M47816 Spondylosis without myelopathy or radiculopathy, lumbar region: Secondary | ICD-10-CM | POA: Insufficient documentation

## 2021-03-02 ENCOUNTER — Ambulatory Visit (INDEPENDENT_AMBULATORY_CARE_PROVIDER_SITE_OTHER): Payer: 59 | Admitting: Podiatry

## 2021-03-02 ENCOUNTER — Other Ambulatory Visit: Payer: Self-pay

## 2021-03-02 ENCOUNTER — Encounter: Payer: Self-pay | Admitting: Podiatry

## 2021-03-02 DIAGNOSIS — M24574 Contracture, right foot: Secondary | ICD-10-CM

## 2021-03-02 DIAGNOSIS — M778 Other enthesopathies, not elsewhere classified: Secondary | ICD-10-CM

## 2021-03-02 DIAGNOSIS — R2242 Localized swelling, mass and lump, left lower limb: Secondary | ICD-10-CM

## 2021-03-02 MED ORDER — NITROGLYCERIN 0.2 MG/HR TD PT24: 0.2000 mg | MEDICATED_PATCH | Freq: Every day | TRANSDERMAL | 3 refills | Status: DC

## 2021-03-03 NOTE — Progress Notes (Signed)
She presents today concerned about a small nodular mass the distal aspect of the foot not far from her previous plantar fibroma excision.  States that it is been there for few repeat weeks is already increased in size and is tender and looks discolored.  He denies any trauma.  Objective: Vital signs are stable alert oriented x3.  Pulses are palpable.  Left foot demonstrates a plantar scar from excision of plantar fibroma distal to that area and more lateral there is a small nodular mass that is tender on palpation measures less than a centimeter in diameter and is hyperpigmented this appears to be more of a thrombotic lesion of the superficial veins.  Assessment small nodular lesion more than likely a varicosity.  Plan: I recommended warm moist heat with a Nitropatch and a baby aspirin daily.  I will follow-up with her in 6 weeks if not improved we will consider excision of the lesion for biopsy.

## 2021-04-13 ENCOUNTER — Encounter: Payer: Self-pay | Admitting: Podiatry

## 2021-04-13 ENCOUNTER — Ambulatory Visit (INDEPENDENT_AMBULATORY_CARE_PROVIDER_SITE_OTHER): Payer: 59 | Admitting: Podiatry

## 2021-04-13 ENCOUNTER — Other Ambulatory Visit: Payer: Self-pay

## 2021-04-13 DIAGNOSIS — R2242 Localized swelling, mass and lump, left lower limb: Secondary | ICD-10-CM

## 2021-04-13 NOTE — Progress Notes (Signed)
She presents today for follow-up of a nodule to the plantar arch left.  States that is smaller and less painful than it was in the past but I wore a pair shoes yesterday that really aggravated it and states that she can use the patches which seem to help because it caused a migraine headache.  Objective: Vital signs are stable she is alert and oriented x3.  Pulses are palpable.  She has a vascular lesion plantar aspect of her left foot that is painful.  It measures l approximately 5 mm in diameter and I do not want to have to try to image it with smaller imaging.  I think going to the surgery center and doing exploratory removing a total sentence pathologies will be her best bet.  Assessment: Vascular lesion plantar aspect left foot.  Plan: At this point I consented her for excision lesion plantar aspect left foot.  Consented her for this and we will go to the surgery center for we did discuss possible complications associated with it.  She understands and is amenable to a follow-up with me in

## 2021-04-16 ENCOUNTER — Telehealth: Payer: Self-pay | Admitting: Urology

## 2021-04-16 NOTE — Telephone Encounter (Signed)
DOS - 05/07/21  EXC TUMOR LEFT --- 28090  [employer] EFFECTIVE DATE - 05/30/20   PLAN DEDUCTIBLE - Member's plan does not have an In-Network individual plan deductible OUT OF POCKET - $6,850.00 W/ $2,381.77 REMAINING COINSURANCE - 20%  COPAY - $0.00   PER [employer] WEB SITE FOR CPT CODE 64353 Notification or Prior Authorization is not required for the requested services  Decision ID #:[id number]

## 2021-05-06 ENCOUNTER — Other Ambulatory Visit: Payer: Self-pay | Admitting: Podiatry

## 2021-05-06 MED ORDER — ONDANSETRON HCL 4 MG PO TABS: 4.0000 mg | ORAL_TABLET | Freq: Three times a day (TID) | ORAL | 0 refills | Status: DC | PRN

## 2021-05-06 MED ORDER — TRAMADOL HCL 50 MG PO TABS
50.0000 mg | ORAL_TABLET | Freq: Three times a day (TID) | ORAL | 0 refills | Status: AC | PRN
Start: 2021-05-06 — End: 2021-05-11

## 2021-05-07 DIAGNOSIS — R2242 Localized swelling, mass and lump, left lower limb: Secondary | ICD-10-CM | POA: Diagnosis not present

## 2021-05-10 ENCOUNTER — Telehealth: Payer: Self-pay | Admitting: *Deleted

## 2021-05-10 NOTE — Telephone Encounter (Signed)
Returned call and spoke with patient giving recommendations per Dr Cannon Kettle, said that she has been using the tylenol in between tramadol, still not helping with pain but she will try to loosen up bandages to see if that helps, will call back if not.

## 2021-05-10 NOTE — Telephone Encounter (Signed)
Patient is calling for aftercare surgery instructions and that the pain medicine is not helping with pain, suppose to take q 8 hours,having to take q 6 hours and is still waking her up in middle of the night.  Returned the call to patient and explained the post surgery instructions, will send message to doctor concerning additional pain medicine request. Please advise.

## 2021-05-13 ENCOUNTER — Encounter: Payer: Self-pay | Admitting: Podiatry

## 2021-05-13 ENCOUNTER — Ambulatory Visit (INDEPENDENT_AMBULATORY_CARE_PROVIDER_SITE_OTHER): Payer: 59 | Admitting: Podiatry

## 2021-05-13 ENCOUNTER — Other Ambulatory Visit: Payer: Self-pay

## 2021-05-13 DIAGNOSIS — Z9889 Other specified postprocedural states: Secondary | ICD-10-CM

## 2021-05-13 DIAGNOSIS — R2242 Localized swelling, mass and lump, left lower limb: Secondary | ICD-10-CM

## 2021-05-15 NOTE — Progress Notes (Signed)
She presents today for her first postop visit she is status post excision soft tissue lesion plantar aspect of the foot.  States that is doing pretty well mildly tender to walk on.  Objective: No erythema edema cellulitis drainage or odor dry sterile dressing was intact once removed did not demonstrate any bleeding.  Sutures are intact margins are well coapted.  Assessment: Well-healing surgical foot.  Plan: Redressed today dressed a compressive dressing follow-up with me in a week hopefully be able to remove the sutures at that time.

## 2021-05-20 ENCOUNTER — Encounter: Payer: Self-pay | Admitting: Podiatry

## 2021-05-20 ENCOUNTER — Other Ambulatory Visit: Payer: Self-pay

## 2021-05-20 ENCOUNTER — Ambulatory Visit (INDEPENDENT_AMBULATORY_CARE_PROVIDER_SITE_OTHER): Payer: 59 | Admitting: Podiatry

## 2021-05-20 DIAGNOSIS — R2242 Localized swelling, mass and lump, left lower limb: Secondary | ICD-10-CM

## 2021-05-20 DIAGNOSIS — Z9889 Other specified postprocedural states: Secondary | ICD-10-CM

## 2021-05-20 NOTE — Progress Notes (Signed)
She presents today for follow-up of excision soft tissue tumor plantar aspect of the left foot.  She states that she is doing quite well though it is some still little tender but all in all is feeling pretty good.  Objective: Plantar aspect of the left foot does demonstrate 3 simple stitches margins appear to be intact and no dehiscence is noted.  No signs of infection no erythema cellulitis drainage or odor.  Assessment: Well-healing surgical foot.  Plan: Remove stitches today allow her to get back to her regular routine follow-up with me on an as-needed basis or in the next 2 to 3 weeks.  Pathology did demonstrate a varix.

## 2021-06-03 ENCOUNTER — Encounter: Payer: 59 | Admitting: Podiatry

## 2021-06-17 ENCOUNTER — Encounter: Payer: 59 | Admitting: Podiatry

## 2021-08-16 ENCOUNTER — Other Ambulatory Visit: Payer: Self-pay | Admitting: Obstetrics & Gynecology

## 2021-08-16 DIAGNOSIS — Z1231 Encounter for screening mammogram for malignant neoplasm of breast: Secondary | ICD-10-CM

## 2021-08-19 ENCOUNTER — Ambulatory Visit
Admission: RE | Admit: 2021-08-19 | Discharge: 2021-08-19 | Disposition: A | Discharge status: Home / Self Care | Payer: 59 | Source: Ambulatory Visit | Attending: Obstetrics & Gynecology | Admitting: Obstetrics & Gynecology

## 2021-08-19 DIAGNOSIS — Z1231 Encounter for screening mammogram for malignant neoplasm of breast: Secondary | ICD-10-CM

## 2022-01-24 ENCOUNTER — Emergency Department (HOSPITAL_BASED_OUTPATIENT_CLINIC_OR_DEPARTMENT_OTHER)
Admission: EM | Admit: 2022-01-24 | Discharge: 2022-01-24 | Disposition: A | Discharge status: Home / Self Care | Payer: 59 | Attending: Emergency Medicine | Admitting: Emergency Medicine

## 2022-01-24 ENCOUNTER — Encounter (HOSPITAL_BASED_OUTPATIENT_CLINIC_OR_DEPARTMENT_OTHER): Payer: Self-pay | Admitting: Emergency Medicine

## 2022-01-24 ENCOUNTER — Other Ambulatory Visit: Payer: Self-pay

## 2022-01-24 ENCOUNTER — Emergency Department (HOSPITAL_BASED_OUTPATIENT_CLINIC_OR_DEPARTMENT_OTHER): Payer: 59

## 2022-01-24 DIAGNOSIS — N132 Hydronephrosis with renal and ureteral calculous obstruction: Secondary | ICD-10-CM | POA: Insufficient documentation

## 2022-01-24 DIAGNOSIS — I10 Essential (primary) hypertension: Secondary | ICD-10-CM | POA: Insufficient documentation

## 2022-01-24 DIAGNOSIS — N200 Calculus of kidney: Secondary | ICD-10-CM

## 2022-01-24 DIAGNOSIS — R109 Unspecified abdominal pain: Secondary | ICD-10-CM | POA: Diagnosis present

## 2022-01-24 DIAGNOSIS — E119 Type 2 diabetes mellitus without complications: Secondary | ICD-10-CM | POA: Insufficient documentation

## 2022-01-24 LAB — URINALYSIS, ROUTINE W REFLEX MICROSCOPIC
Bilirubin Urine: NEGATIVE
Glucose, UA: NEGATIVE mg/dL
Ketones, ur: NEGATIVE mg/dL
Leukocytes,Ua: NEGATIVE
Nitrite: NEGATIVE
Specific Gravity, Urine: 1.01 (ref 1.005–1.030)
pH: 6 (ref 5.0–8.0)

## 2022-01-24 LAB — BASIC METABOLIC PANEL
Anion gap: 9 (ref 5–15)
BUN: 13 mg/dL (ref 6–20)
CO2: 24 mmol/L (ref 22–32)
Calcium: 9.7 mg/dL (ref 8.9–10.3)
Chloride: 109 mmol/L (ref 98–111)
Creatinine, Ser: 0.9 mg/dL (ref 0.44–1.00)
GFR, Estimated: >60 mL/min (ref >60)
Glucose, Bld: 113 mg/dL — ABNORMAL HIGH (ref 70–99)
Potassium: 4 mmol/L (ref 3.5–5.1)
Sodium: 142 mmol/L (ref 135–145)

## 2022-01-24 LAB — LIPASE, BLOOD: Lipase: 50 U/L (ref 11–51)

## 2022-01-24 LAB — CBC
HCT: 41.1 % (ref 36.0–46.0)
Hemoglobin: 13.8 g/dL (ref 12.0–15.0)
MCH: 31.6 pg (ref 26.0–34.0)
MCHC: 33.6 g/dL (ref 30.0–36.0)
MCV: 94.1 fL (ref 80.0–100.0)
Platelets: 261 10*3/uL (ref 150–400)
RBC: 4.37 MIL/uL (ref 3.87–5.11)
RDW: 12.4 % (ref 11.5–15.5)
WBC: 5.7 10*3/uL (ref 4.0–10.5)
nRBC: 0 % (ref 0.0–0.2)

## 2022-01-24 LAB — PREGNANCY, URINE: Preg Test, Ur: NEGATIVE

## 2022-01-24 MED ORDER — FENTANYL CITRATE PF 50 MCG/ML IJ SOSY
50.0000 ug | PREFILLED_SYRINGE | Freq: Once | INTRAMUSCULAR | Status: AC
  Administered 2022-01-24: 50 ug via INTRAVENOUS
  Filled 2022-01-24: qty 1

## 2022-01-24 MED ORDER — ONDANSETRON HCL 4 MG/2ML IJ SOLN
4.0000 mg | Freq: Once | INTRAMUSCULAR | Status: AC
  Administered 2022-01-24: 4 mg via INTRAVENOUS
  Filled 2022-01-24: qty 2

## 2022-01-24 MED ORDER — FENTANYL CITRATE PF 50 MCG/ML IJ SOSY: 50.0000 ug | PREFILLED_SYRINGE | Freq: Once | INTRAMUSCULAR | Status: DC

## 2022-01-24 MED ORDER — ONDANSETRON HCL 4 MG PO TABS: 4.0000 mg | ORAL_TABLET | Freq: Four times a day (QID) | ORAL | 0 refills | Status: DC

## 2022-01-24 MED ORDER — OXYCODONE-ACETAMINOPHEN 5-325 MG PO TABS: 1.0000 | ORAL_TABLET | Freq: Four times a day (QID) | ORAL | 0 refills | Status: DC | PRN

## 2022-01-24 MED ORDER — HYDROMORPHONE HCL 1 MG/ML IJ SOLN
0.5000 mg | Freq: Once | INTRAMUSCULAR | Status: AC
  Administered 2022-01-24: 0.5 mg via INTRAVENOUS
  Filled 2022-01-24: qty 1

## 2022-01-24 MED ORDER — TAMSULOSIN HCL 0.4 MG PO CAPS: 0.4000 mg | ORAL_CAPSULE | Freq: Every day | ORAL | 0 refills | Status: DC

## 2022-01-24 MED ORDER — LACTATED RINGERS IV BOLUS
1000.0000 mL | Freq: Once | INTRAVENOUS | Status: AC
  Administered 2022-01-24: 1000 mL via INTRAVENOUS

## 2022-01-24 NOTE — ED Triage Notes (Signed)
Pt also states she has green stool,normal consistency for 5 days.

## 2022-01-24 NOTE — ED Provider Notes (Signed)
[facility] EMERGENCY DEPT Provider Note   CSN: [id number] Arrival date & time: 01/24/22  1259     History  Chief Complaint  Patient presents with   Flank Pain    Suzanne Gutierrez is a 56 y.o. female. With past medical history of hypertension, diabetes, obesity, OA, who presents to the emergency department with flank pain.  States she began having flank pain today at work this morning.  She states that she was sitting at her desk when she had a sudden onset of sharp left-sided flank pain that was nonradiating.  She states that she also became nauseated without vomiting.  States that the pain has been intermittent since then and will completely absolve and then returned to severe pain.  She was going to see her PCP but they were closed today.  She states that she has also had difficulty urinating since then.  She denies gross hematuria.  She denies any fevers or diaphoresis.  She does state that on Saturday she was setting up a wedding outside when she got very overheated and had to rest.  She states at that time she did not have any abdominal or flank pain.  She postulates that this is related.  No previous history of stones or family history.  She denies alcohol use, NSAID use, pelvic pain, vaginal discharge, diarrhea.     Flank Pain       Home Medications Prior to Admission medications   Medication Sig Start Date End Date Taking? Authorizing Provider  ondansetron (ZOFRAN) 4 MG tablet Take 1 tablet (4 mg total) by mouth every 6 (six) hours. 01/24/22  Yes Mickie Hillier, PA-C  oxyCODONE-acetaminophen (PERCOCET/ROXICET) 5-325 MG tablet Take 1 tablet by mouth every 6 (six) hours as needed for severe pain. 01/24/22  Yes Mickie Hillier, PA-C  tamsulosin (FLOMAX) 0.4 MG CAPS capsule Take 1 capsule (0.4 mg total) by mouth daily. 01/24/22  Yes Mickie Hillier, PA-C  albuterol (VENTOLIN HFA) 108 (90 Base) MCG/ACT inhaler Inhale 2 puffs into the lungs 2 (two) times daily. 11/18/20    [provider]  ALPRAZolam Duanne Moron) 0.25 MG tablet Take 0.25 mg by mouth daily as needed for anxiety.  01/23/18   [provider]  buPROPion (WELLBUTRIN XL) 150 MG 24 hr tablet Take 150 mg by mouth every morning. 12/28/20   [provider]  Calcium Carb-Cholecalciferol (CALCIUM 600 + D PO) Take 1 tablet by mouth 3 (three) times daily.    [provider]  clotrimazole-betamethasone (LOTRISONE) cream Apply 1 application topically 2 (two) times daily. 08/21/19   Clark-Burning, Anderson Malta, PA-C  eletriptan (RELPAX) 40 MG tablet Take by mouth. 02/08/21   [provider]  escitalopram (LEXAPRO) 10 MG tablet Take 10 mg by mouth at bedtime.     [provider]  lidocaine (XYLOCAINE) 5 % ointment Apply 1 application topically daily as needed for mild pain. For numbing an area 07/24/17   [provider]  Multiple Vitamin (MULTIVITAMIN WITH MINERALS) TABS tablet Take 1 tablet by mouth daily.     [provider]  nitroGLYCERIN (NITRO-DUR) 0.2 mg/hr patch Place 1 patch (0.2 mg total) onto the skin daily. 12 hours on and 12 hours off 03/02/21   Hyatt, Max T, DPM  progesterone (PROMETRIUM) 200 MG capsule Take 200 mg by mouth every evening.    [provider]  topiramate (TOPAMAX) 100 MG tablet Take 100 mg by mouth 2 (two) times daily.    [provider]  traMADol (ULTRAM) 50 MG tablet Take 50 mg by mouth every 6 (six) hours as needed (migraines).  03/16/18   [provider]  valACYclovir (VALTREX) 500 MG tablet Take by mouth. 10/06/20   [provider]  pantoprazole (PROTONIX) 40 MG tablet Take 1 tablet (40 mg total) by mouth daily. 07/13/18 05/24/20  Alphonsa Overall, MD      Allergies    Codeine and Nsaids    Review of Systems   Review of Systems  Gastrointestinal:  Positive for nausea. Negative for vomiting.  Genitourinary:  Positive for difficulty urinating and flank pain. Negative for hematuria.  All other  systems reviewed and are negative.   Physical Exam Updated Vital Signs BP 118/89   Pulse 65   Temp 98.1 F (36.7 C) (Oral)   Resp 18   LMP 08/28/2017 (Approximate)   SpO2 100%  Physical Exam Vitals and nursing note reviewed.  Constitutional:      General: She is not in acute distress.    Appearance: Normal appearance. She is ill-appearing. She is not toxic-appearing.  HENT:     Head: Normocephalic.     Mouth/Throat:     Mouth: Mucous membranes are moist.     Pharynx: Oropharynx is clear.  Eyes:     General: No scleral icterus.    Extraocular Movements: Extraocular movements intact.     Pupils: Pupils are equal, round, and reactive to light.  Cardiovascular:     Rate and Rhythm: Normal rate and regular rhythm.     Pulses: Normal pulses.     Heart sounds: No murmur heard. Pulmonary:     Effort: Pulmonary effort is normal. No respiratory distress.     Breath sounds: Normal breath sounds.  Abdominal:     General: Bowel sounds are normal. There is no distension.     Palpations: Abdomen is soft.     Tenderness: There is no abdominal tenderness. There is left CVA tenderness. There is no right CVA tenderness, guarding or rebound.  Musculoskeletal:        General: Normal range of motion.     Cervical back: Neck supple.  Skin:    General: Skin is warm and dry.     Capillary Refill: Capillary refill takes less than 2 seconds.  Neurological:     General: No focal deficit present.     Mental Status: She is alert and oriented to person, place, and time. Mental status is at baseline.  Psychiatric:        Mood and Affect: Mood normal.        Behavior: Behavior normal.        Thought Content: Thought content normal.        Judgment: Judgment normal.     ED Results / Procedures / Treatments   Labs (all labs ordered are listed, but only abnormal results are displayed) Labs Reviewed  BASIC METABOLIC PANEL - Abnormal; Notable for the following components:      Result Value    Glucose, Bld 113 (*)    All other components within normal limits  URINALYSIS, ROUTINE W REFLEX MICROSCOPIC - Abnormal; Notable for the following components:   APPearance HAZY (*)    Hgb urine dipstick LARGE (*)    Protein, ur TRACE (*)    Bacteria, UA RARE (*)    All other components within normal limits  CBC  LIPASE, BLOOD  PREGNANCY, URINE   EKG None  Radiology CT ABDOMEN PELVIS WO CONTRAST  Result Date: 01/24/2022 CLINICAL  DATA:  Left flank pain started about an hour back, kidney stone suspected EXAM: CT ABDOMEN AND PELVIS WITHOUT CONTRAST TECHNIQUE: Multidetector CT imaging of the abdomen and pelvis was performed following the standard protocol without IV contrast. RADIATION DOSE REDUCTION: This exam was performed according to the departmental dose-optimization program which includes automated exposure control, adjustment of the mA and/or kV according to patient size and/or use of iterative reconstruction technique. COMPARISON:  May 01, 2019 FINDINGS: Lower chest: No acute abnormality. Hepatobiliary: Hyperdense likely small gallstones in the dependent gallbladder. Small subcentimeter cyst at the dome of the right lobe of the liver is without significant interval change. Remainder of the hepatic parenchyma has a normal appearance. No intrahepatic biliary dilatation. Pancreas: Unremarkable. No pancreatic ductal dilatation or surrounding inflammatory changes. Spleen: Normal in size without focal abnormality. Adrenals/Urinary Tract: Both the adrenals and the right kidney have a normal appearance. At the left side, there is mild hydronephrosis and proximal hydroureter seen secondary to a 3.5 mm obstructing calculus at the pelvic ureteral junction (image 33/2). Urinary bladder has a normal appearance. Stomach/Bowel: Again seen are the post bariatric surgical changes. Bowel-gas pattern is nonobstructive. Vascular/Lymphatic: No significant vascular findings are present. No enlarged abdominal or  pelvic lymph nodes. Reproductive: Uterus and bilateral adnexa are unremarkable. Other: Small umbilical hernia contains fat. No abdominopelvic ascites. Musculoskeletal: There are left-sided L5-S1 vertebral body fusion changes seen. IMPRESSION: 1. Mild left hydronephrosis and proximal hydroureter secondary to a 3.5 mm obstructing calculus at the pelvic ureteral junction. No perinephric stranding. Right kidney is unremarkable. 2. Apparent tiny dense gallstones in the dependent gallbladder without CT evidence of cholecystitis. 3. Status post bariatric surgery. Bowel-gas pattern is nonobstructive. Moderate stool burden in the colon. Electronically Signed   By: Frazier Richards M.D.   On: 01/24/2022 14:52    Procedures Procedures   Medications Ordered in ED Medications  HYDROmorphone (DILAUDID) injection 0.5 mg (has no administration in time range)  lactated ringers bolus 1,000 mL (0 mLs Intravenous Stopped 01/24/22 1748)  ondansetron (ZOFRAN) injection 4 mg (4 mg Intravenous Given 01/24/22 1638)  fentaNYL (SUBLIMAZE) injection 50 mcg (50 mcg Intravenous Given 01/24/22 1640)    ED Course/ Medical Decision Making/ A&P                           Medical Decision Making Amount and/or Complexity of Data Reviewed Labs: ordered. Radiology: ordered.  Risk Prescription drug management.  This patient presents to the ED with chief complaint(s) of flank pain with pertinent past medical history of hypertension, OA, GERD which further complicates the presenting complaint. The complaint involves an extensive differential diagnosis and also carries with it a high risk of complications and morbidity.    The differential diagnosis includes Acute hepatobiliary disease, pancreatitis, appendicitis, PUD, gastritis, SBO, diverticulitis, colitis, viral gastroenteritis, Crohn's, UC, vascular catastrophe, UTI, pyelonephritis, renal stone, obstructed stone, infected stone, ovarian torsion, ectopic pregnancy, TOA, PID, STD, etc.     Additional history obtained: Additional history obtained from family Records reviewed Care Everywhere/External Records  ED Course and Reassessment: 56 year old female who presents to the emergency department with a 1 day history of left flank pain, nausea. Physical exam consistent with left CVA tenderness.  No abdominal tenderness.  No peritoneal findings on exam. Labs and CT imaging was ordered in triage.  No AKI, leukocytosis, electrolyte derangements.  She was found to have a 3.5 mm left obstructing stone at the UPJ with hydroureter and hydronephrosis. I added  on a UA Given fluids, Zofran, fentanyl as she has contraindication to NSAIDs. On reassessment patient still has ongoing pain.  Given her 0.5 mg Dilaudid.  She thinks that overall though she feels well enough to go home. I consulted and spoke with Dr. Lovena Neighbours, urology who has given medication recommendations for discharge including tamsulosin, pain control, Zofran.  We will follow-up next week.  I have given his information to the patient.  UA has resulted without UTI.  No evidence of infected stone.  Do not feel that her left flank pain is related to an underlying pneumonia, lipase is negative doubt pancreatitis.  Symptoms are inconsistent with PUD or GERD or gastroenteritis, bowel obstruction, viscus perforation or other etiology.  I have discussed the findings with her and plan for pain, nausea and tamsulosin.  She is agreeable to this plan.  Given return precautions for fevers or inability to urinate.  She verbalized understanding.  Feel that she is safe for discharge at this time.  Independent labs interpretation:  The following labs were independently interpreted:  CBC within normal limits  BMP without AKI, electrolyte derangement  Lipase 50, negative  UA negative  Independent visualization of imaging: - I independently visualized the following imaging with scope of interpretation limited to determining acute life threatening  conditions related to emergency care: CT A/P with contrast, which revealed 3.17m obstructing left stone at UPJ with hydroureter and mild hydronephrosis   Consultation: - Consulted or discussed management/test interpretation w/ external professional: Dr. WLovena Neighbours urology who requests zofran, tamsulosin, pain control and follow-up next week. If UA with UTI he is still okay with outpatient follow-up given no fever, leukocytosis or AKI.   Consideration for admission or further workup: Not indicated Social Determinants of health: none identified  Final Clinical Impression(s) / ED Diagnoses Final diagnoses:  Nephrolithiasis    Rx / DC Orders ED Discharge Orders          Ordered    tamsulosin (FLOMAX) 0.4 MG CAPS capsule  Daily        01/24/22 1851    ondansetron (ZOFRAN) 4 MG tablet  Every 6 hours        01/24/22 1851    oxyCODONE-acetaminophen (PERCOCET/ROXICET) 5-325 MG tablet  Every 6 hours PRN        01/24/22 1851              AMickie Hillier PA-C 01/24/22 1900    BElgie Congo MD 01/25/22 [phone]

## 2022-01-24 NOTE — ED Triage Notes (Signed)
Stabbing left pain started about an hour ago. Pt states Friday she was vomiting, Saturday was out in heat and got hot  and weak and almost passed out. Felt like couldn't quench her thirst. Sunday she still didn't feel well and very thirsty. No fevers

## 2022-01-24 NOTE — ED Triage Notes (Signed)
About a month ago, had blood in urine about a month ago for checkup and Ct did not show anything. She was treated with antibiotic with suspicion of UTI.

## 2022-01-24 NOTE — Discharge Instructions (Signed)
You were seen in the emergency department today for left flank pain.  You have a kidney stone.  I prescribed you 3 medications that you will use as your kidney stone passes.  1 is Zofran and you will use this for nausea.  The other is tamsulosin which will help to relax your ureter and increased flow out of your kidneys.  And the other one is pain medication.  I have prescribed you Percocet.  Please do not take this with other narcotics.  You have been prescribed a medication that is considered an opiate. Opiates are pain medications that should be used with caution. It is important that you do not drive while taking this medication as it can cause drowsiness and impaired reaction times. Do not mix this medication with benzodiazepine medications or alcohol as this can cause respiratory depression. Additionally, opiates have addicting properties to them. Please use medication as prescribed by your provider.  Please return to the emergency department if you begin to have fever or inability to urinate.  Please follow-up with urology if you have ongoing symptoms.

## 2022-03-12 ENCOUNTER — Encounter: Payer: Self-pay | Admitting: Emergency Medicine

## 2022-03-12 ENCOUNTER — Ambulatory Visit
Admission: EM | Admit: 2022-03-12 | Discharge: 2022-03-12 | Disposition: A | Discharge status: Home / Self Care | Payer: 59 | Attending: Family Medicine | Admitting: Family Medicine

## 2022-03-12 DIAGNOSIS — J069 Acute upper respiratory infection, unspecified: Secondary | ICD-10-CM | POA: Diagnosis present

## 2022-03-12 DIAGNOSIS — Z1152 Encounter for screening for COVID-19: Secondary | ICD-10-CM | POA: Insufficient documentation

## 2022-03-12 LAB — RESP PANEL BY RT-PCR (FLU A&B, COVID) ARPGX2
Influenza A by PCR: NEGATIVE
Influenza B by PCR: NEGATIVE
SARS Coronavirus 2 by RT PCR: NEGATIVE

## 2022-03-12 MED ORDER — PROMETHAZINE-DM 6.25-15 MG/5ML PO SYRP: 5.0000 mL | ORAL_SOLUTION | Freq: Four times a day (QID) | ORAL | 0 refills | Status: DC | PRN

## 2022-03-12 MED ORDER — KETOROLAC TROMETHAMINE 30 MG/ML IJ SOLN
30.0000 mg | Freq: Once | INTRAMUSCULAR | Status: AC
  Administered 2022-03-12: 30 mg via INTRAMUSCULAR

## 2022-03-12 NOTE — ED Provider Notes (Signed)
[name]    CSN: [id number] Arrival date & time: 03/12/22  1037      History   Chief Complaint Chief Complaint  Patient presents with   Cough   Nasal Congestion   Headache    HPI Suzanne Gutierrez is a 56 y.o. female.    Cough Associated symptoms: headaches   Headache Associated symptoms: cough    Here for congestion and sore throat and cough that began October 10. The congestion became more intense and she had some low-grade fever to 100.8 on October 12.  No vomiting or diarrhea.  She has had some sore throat.  Her headache is bothering her a lot also.  She does not tolerate NSAIDs orally due to her history of gastric bypass.  She also does not tolerate codeine   Past Medical History:  Diagnosis Date   Bipolar disorder [facility])    patient denies; reports only having post partum depression 21 years ago    Elevated temperature    at PAT appt 99.3 ; denies fevers at home nor cold/flu sx; reports hot flahses due to menopause    GERD (gastroesophageal reflux disease)    Headache    Migraine   High cholesterol    per patient report    Hypertension    Lichen sclerosus of female genitalia    Morbid obesity with BMI of 40.0-44.9, adult ([place])    Has been in the process of evaluation for GOP   Osteoarthritis    PONV (postoperative nausea and vomiting)     Patient Active Problem List   Diagnosis Date Noted   Arthropathy of lumbar facet joint 11/16/2020   Advanced osteoarthritis of spine 10/13/2020   Acute left-sided low back pain without sciatica 10/05/2020   Body mass index (BMI) 27.0-27.9, adult 10/05/2020   Elevated blood-pressure reading, without diagnosis of hypertension 10/05/2020   Numbness of hand 62/94/7654   Lichen sclerosus 65/07/5463   Digital mucous cyst 08/27/2019   Adhesive capsulitis of right shoulder 09/25/2018   Impingement syndrome of right shoulder region 08/14/2018   Migraine 03/27/2018   Morbid obesity with BMI of 40.0-44.9,  adult ([place]) 12/25/2017   Costochondritis 08/08/2017   Diabetes mellitus without complication ([place]) 68/04/7516   Essential hypertension 07/24/2017   Left-sided chest wall pain 07/21/2017   DOE (dyspnea on exertion) 07/21/2017   Chest pain 07/21/2017   Abnormal finding on EKG 07/21/2017   Hemorrhoids 06/07/2017   Seasonal allergies 04/09/2011    Past Surgical History:  Procedure Laterality Date   APPENDECTOMY  1994   BIOPSY  07/13/2018   Procedure: BIOPSY;  Surgeon: Alphonsa Overall, MD;  Location: [facility];  Service: General;;   breast lift Bilateral 05/2019   CARDIAC CATHETERIZATION  2016   [facility] --was told she had no significant disease.   CARDIAC CATHETERIZATION  2019   reports she had this done at [facility] at the same time as ECHO    CESAREAN SECTION     ESOPHAGOGASTRODUODENOSCOPY (EGD) WITH PROPOFOL N/A 07/13/2018   Procedure: ESOPHAGOGASTRODUODENOSCOPY (EGD) WITH PROPOFOL;  Surgeon: Alphonsa Overall, MD;  Location: [facility];  Service: General;  Laterality: N/A;   GASTRIC ROUX-EN-Y N/A 12/25/2017   Procedure: LAPAROSCOPIC ROUX-EN-Y GASTRIC BYPASS WITH UPPER ENDOSCOPY, ERAS PATHWAY;  Surgeon: Excell Seltzer, MD;  Location: [facility];  Service: General;  Laterality: N/A;   HERNIA REPAIR     X2   NM MYOVIEW LTD  07/27/2017    LOW risk.  EF 67%.  No reversible perfusion noted.  There is a fixed defect in the lateral wall toward the apex likely breast attenuation.   TRANSTHORACIC ECHOCARDIOGRAM  07/27/2017   No regional wall motion abnormality.  (Poor quality).  Mild RV dilation.  Mild LA dilation.   TUBAL LIGATION     WISDOM TOOTH EXTRACTION      OB History     Gravida  2   Para  2   Term  2   Preterm      AB      Living  2      SAB      IAB      Ectopic      Multiple      Live Births               Home Medications    Prior to Admission medications   Medication Sig Start Date End Date Taking? Authorizing Provider   promethazine-dextromethorphan (PROMETHAZINE-DM) 6.25-15 MG/5ML syrup Take 5 mLs by mouth 4 (four) times daily as needed for cough. 03/12/22  Yes Rocio Roam, Gwenlyn Perking, MD  albuterol (VENTOLIN HFA) 108 (90 Base) MCG/ACT inhaler Inhale 2 puffs into the lungs 2 (two) times daily. 11/18/20   [provider]  ALPRAZolam Duanne Moron) 0.25 MG tablet Take 0.25 mg by mouth daily as needed for anxiety.  01/23/18   [provider]  buPROPion (WELLBUTRIN XL) 150 MG 24 hr tablet Take 150 mg by mouth every morning. 12/28/20   [provider]  Calcium Carb-Cholecalciferol (CALCIUM 600 + D PO) Take 1 tablet by mouth 3 (three) times daily.    [provider]  clotrimazole-betamethasone (LOTRISONE) cream Apply 1 application topically 2 (two) times daily. 08/21/19   Clark-Burning, Anderson Malta, PA-C  eletriptan (RELPAX) 40 MG tablet Take by mouth. 02/08/21   [provider]  escitalopram (LEXAPRO) 10 MG tablet Take 10 mg by mouth at bedtime.     [provider]  lidocaine (XYLOCAINE) 5 % ointment Apply 1 application topically daily as needed for mild pain. For numbing an area 07/24/17   [provider]  Multiple Vitamin (MULTIVITAMIN WITH MINERALS) TABS tablet Take 1 tablet by mouth daily.     [provider]  nitroGLYCERIN (NITRO-DUR) 0.2 mg/hr patch Place 1 patch (0.2 mg total) onto the skin daily. 12 hours on and 12 hours off 03/02/21   Hyatt, Max T, DPM  progesterone (PROMETRIUM) 200 MG capsule Take 200 mg by mouth every evening.    [provider]  tamsulosin (FLOMAX) 0.4 MG CAPS capsule Take 1 capsule (0.4 mg total) by mouth daily. 01/24/22   Mickie Hillier, PA-C  topiramate (TOPAMAX) 100 MG tablet Take 100 mg by mouth 2 (two) times daily.    [provider]  traMADol (ULTRAM) 50 MG tablet Take 50 mg by mouth every 6 (six) hours as needed (migraines).  03/16/18   [provider]  valACYclovir (VALTREX) 500 MG tablet Take by mouth.  10/06/20   [provider]  pantoprazole (PROTONIX) 40 MG tablet Take 1 tablet (40 mg total) by mouth daily. 07/13/18 05/24/20  Alphonsa Overall, MD    Family History Family History  Problem Relation Age of Onset   COPD Other    Cancer Other    Hypertension Other    Stroke Other    Diabetes Other    Dementia Mother    Diabetes Father        With diabetic retinopathy   Stroke Father  Stroke Sister    Other Brother        3 brothers with unknown history   Breast cancer Neg Hx     Social History Social History   Tobacco Use   Smoking status: Never   Smokeless tobacco: Never  Vaping Use   Vaping Use: Never used  Substance Use Topics   Alcohol use: Yes    Alcohol/week: 2.0 standard drinks of alcohol    Types: 2 Glasses of wine per week   Drug use: No     Allergies   Codeine and Nsaids   Review of Systems Review of Systems  Respiratory:  Positive for cough.   Neurological:  Positive for headaches.     Physical Exam Triage Vital Signs ED Triage Vitals  Enc Vitals Group     BP --      Pulse Rate 03/12/22 1204 77     Resp 03/12/22 1204 16     Temp 03/12/22 1204 98.4 F (36.9 C)     Temp Source 03/12/22 1204 Oral     SpO2 03/12/22 1204 98 %     Weight --      Height --      Head Circumference --      Peak Flow --      Pain Score 03/12/22 1201 3     Pain Loc --      Pain Edu? --      Excl. in [place]? --    No data found.  Updated Vital Signs Pulse 77   Temp 98.4 F (36.9 C) (Oral)   Resp 16   LMP 08/28/2017 (Approximate)   SpO2 98%   Visual Acuity Right Eye Distance:   Left Eye Distance:   Bilateral Distance:    Right Eye Near:   Left Eye Near:    Bilateral Near:     Physical Exam Vitals reviewed.  Constitutional:      General: She is not in acute distress.    Appearance: She is not toxic-appearing.  HENT:     Right Ear: Tympanic membrane and ear canal normal.     Left Ear: Tympanic membrane and ear canal normal.     Nose: Nose  normal.     Mouth/Throat:     Mouth: Mucous membranes are moist.     Comments: There is clear mucus in oropharynx and mild erythema of the posterior oropharynx Eyes:     Extraocular Movements: Extraocular movements intact.     Conjunctiva/sclera: Conjunctivae normal.     Pupils: Pupils are equal, round, and reactive to light.  Cardiovascular:     Rate and Rhythm: Normal rate and regular rhythm.     Heart sounds: No murmur heard. Pulmonary:     Effort: Pulmonary effort is normal. No respiratory distress.     Breath sounds: No stridor. No wheezing, rhonchi or rales.  Musculoskeletal:     Cervical back: Neck supple.  Lymphadenopathy:     Cervical: No cervical adenopathy.  Skin:    Capillary Refill: Capillary refill takes less than 2 seconds.     Coloration: Skin is not jaundiced or pale.  Neurological:     General: No focal deficit present.     Mental Status: She is alert and oriented to person, place, and time.  Psychiatric:        Behavior: Behavior normal.      UC Treatments / Results  Labs (all labs ordered are listed, but only abnormal results are displayed) Labs Reviewed  RESP PANEL BY RT-PCR (FLU A&B, COVID) [id number]    EKG   Radiology No results found.  Procedures Procedures (including critical care time)  Medications Ordered in UC Medications  ketorolac (TORADOL) 30 MG/ML injection 30 mg (has no administration in time range)    Initial Impression / Assessment and Plan / UC Course  I have reviewed the triage vital signs and the nursing notes.  Pertinent labs & imaging results that were available during my care of the patient were reviewed by me and considered in my medical decision making (see chart for details).        We will swab for COVID, and if she is positive she is a candidate for Paxlovid.  Her last EGFR was greater than 60 in August of this year.  Since she had the fever, I am going to also swab for flu, but she is outside the window for  treatment  Symptomatic treatment is sent in for some Promethazine DM.  Have given her a shot of Toradol here for the headache.  She has some Flonase at home also to use she will begin using saline spray also Final Clinical Impressions(s) / UC Diagnoses   Final diagnoses:  Viral URI with cough     Discharge Instructions      Promethazine DM--take 5 mL 4 times daily as needed for cough.  This can make you sleepy  Use your Flonase once daily for the next week or 2  Also rinse your nose with saline nasal spray can help.   You have been swabbed for COVID/flu, and the test will result in the next 24 hours. Our staff will call you if positive. If the COVID test is positive, you should quarantine for 5 days from the start of your symptoms      ED Prescriptions     Medication Sig Dispense Auth. Provider   promethazine-dextromethorphan (PROMETHAZINE-DM) 6.25-15 MG/5ML syrup Take 5 mLs by mouth 4 (four) times daily as needed for cough. 118 mL Barrett Henle, MD      I have reviewed the PDMP during this encounter.   Barrett Henle, MD 03/12/22 [phone]

## 2022-03-12 NOTE — ED Triage Notes (Addendum)
Pt said x 5 days has had cough, congestion, nasal drainage, that is going down the back of her throat. Low grade fevers, pressure in her face and left side of throat. Thick mucus

## 2022-03-12 NOTE — Discharge Instructions (Addendum)
Promethazine DM--take 5 mL 4 times daily as needed for cough.  This can make you sleepy  Use your Flonase once daily for the next week or 2  Also rinse your nose with saline nasal spray can help.   You have been swabbed for COVID/flu, and the test will result in the next 24 hours. Our staff will call you if positive. If the COVID test is positive, you should quarantine for 5 days from the start of your symptoms

## 2022-07-22 ENCOUNTER — Encounter (HOSPITAL_COMMUNITY): Payer: Self-pay | Admitting: *Deleted

## 2022-08-02 ENCOUNTER — Ambulatory Visit (INDEPENDENT_AMBULATORY_CARE_PROVIDER_SITE_OTHER): Payer: 59 | Admitting: Psychology

## 2022-08-02 DIAGNOSIS — F411 Generalized anxiety disorder: Secondary | ICD-10-CM
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
## 2022-08-11 ENCOUNTER — Ambulatory Visit (INDEPENDENT_AMBULATORY_CARE_PROVIDER_SITE_OTHER): Payer: 59 | Admitting: Psychology

## 2022-08-11 DIAGNOSIS — F411 Generalized anxiety disorder: Secondary | ICD-10-CM | POA: Diagnosis not present
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
## 2022-08-18 ENCOUNTER — Ambulatory Visit (INDEPENDENT_AMBULATORY_CARE_PROVIDER_SITE_OTHER): Payer: 59 | Admitting: Psychology

## 2022-08-18 DIAGNOSIS — F411 Generalized anxiety disorder: Secondary | ICD-10-CM

## 2022-08-18 NOTE — Progress Notes (Signed)
[place] Counselor/Therapist Progress Note  Patient ID: Suzanne Gutierrez, MRN: [id number],    Date: 08/18/2022  Time Spent: 45 mins  Treatment Type: Individual Therapy  Reported Symptoms: Pt presents for this session, in person in the office, granting consent for the session.    Mental Status Exam: Appearance:  Casual     Behavior: Appropriate  Motor: Normal  Speech/Language:  Clear and Coherent  Affect: Appropriate  Mood: anxious  Thought process: normal  Thought content:   WNL  Sensory/Perceptual disturbances:   WNL  Orientation: oriented to person  Attention: Good  Concentration: Good  Memory: WNL  Fund of knowledge:  Good  Insight:   Good  Judgment:  Good  Impulse Control: Good   Risk Assessment: Danger to Self:  No Self-injurious Behavior: No Danger to Others: No Duty to Warn:no Physical Aggression / Violence:No  Access to Firearms a concern: No  Gang Involvement:No   Subjective: Pt shares that she "has tracked my anxiety since last week.  Sometimes I feel nauseous when I get anxious.  I felt kind of silly writing the episodes down."  Pt shares that she got anxious often when she was in the car, there were a couple of times at work, etc.  Pt shares that her brother died 4 yrs ago this past 09/16/2022; she got out of the office for a couple of hours and that helped pt.  Pt shares her daughter and Eduard Clos work in the office with her at [employer] and they do a great job taking care of smaller things in the office.  Encouraged pt take breaks in the middle part of the day and in the morning and in the afternoon; to take advantage of going out of the office from time to time as well.  Pt shares that she enjoys her job and feels like the owner of the company is really good to pt.  Pt shares that she noticed that her anxious episodes did not last as long as she thought they did and she was happy about that realization.  Talked with pt about the benefits of  visualization and meditations to help occupy her mind in the midst of an anxious episode.  Pt will look for apps with these items on them and will practice between now and out follow up session in 2 wks.      Interventions: Cognitive Behavioral Therapy  Diagnosis:Generalized anxiety disorder  Plan: Treatment Plan Strengths/Abilities:  Intelligent, Intuitive, Willing to participate in therapy Treatment Preferences:  Outpatient Individual Therapy Statement of Needs:  Patient is to use CBT, mindfulness and coping skills to help manage and/or decrease symptoms associated with their diagnosis. Symptoms:  Depressed/Irritable mood, worry, social withdrawal Problems Addressed:  Depressive thoughts, Sadness, Sleep issues, etc. Long Term Goals:  Pt to reduce overall level, frequency, and intensity of the feelings of anxiety as evidenced by decreased irritability, negative self talk, and helpless feelings from 6 to 7 days/week to 0 to 1 days/week, per client report, for at least 3 consecutive months.  Progress: 20% Short Term Goals:  Pt to verbally express understanding of the relationship between feelings of anxiety and their impact on thinking patterns and behaviors.  Pt to verbalize an understanding of the role that distorted thinking plays in creating fears, excessive worry, and ruminations.  Progress: 20% Target Date:  08/11/2023 Frequency:  Weekly Modality:  Cognitive Behavioral Therapy Interventions by Therapist:  Therapist will use CBT, Mindfulness exercises, Coping skills and Referrals, as  needed by client. Client has verbally approved this treatment plan.  Ivan Anchors, [facility]

## 2022-09-01 ENCOUNTER — Ambulatory Visit: Payer: 59 | Admitting: Psychology

## 2022-09-06 ENCOUNTER — Ambulatory Visit: Payer: 59 | Admitting: Psychology

## 2022-11-12 ENCOUNTER — Ambulatory Visit
Admission: EM | Admit: 2022-11-12 | Discharge: 2022-11-12 | Disposition: A | Discharge status: Home / Self Care | Payer: 59

## 2022-11-12 ENCOUNTER — Emergency Department (HOSPITAL_BASED_OUTPATIENT_CLINIC_OR_DEPARTMENT_OTHER): Payer: 59

## 2022-11-12 ENCOUNTER — Other Ambulatory Visit: Payer: Self-pay

## 2022-11-12 ENCOUNTER — Emergency Department (HOSPITAL_BASED_OUTPATIENT_CLINIC_OR_DEPARTMENT_OTHER)
Admission: EM | Admit: 2022-11-12 | Discharge: 2022-11-12 | Disposition: A | Discharge status: Home / Self Care | Payer: 59 | Attending: Emergency Medicine | Admitting: Emergency Medicine

## 2022-11-12 DIAGNOSIS — R1012 Left upper quadrant pain: Secondary | ICD-10-CM | POA: Insufficient documentation

## 2022-11-12 DIAGNOSIS — I10 Essential (primary) hypertension: Secondary | ICD-10-CM | POA: Insufficient documentation

## 2022-11-12 DIAGNOSIS — E119 Type 2 diabetes mellitus without complications: Secondary | ICD-10-CM | POA: Insufficient documentation

## 2022-11-12 DIAGNOSIS — Z79899 Other long term (current) drug therapy: Secondary | ICD-10-CM | POA: Insufficient documentation

## 2022-11-12 DIAGNOSIS — R0781 Pleurodynia: Secondary | ICD-10-CM | POA: Diagnosis not present

## 2022-11-12 LAB — POCT URINALYSIS DIP (MANUAL ENTRY)
Bilirubin, UA: NEGATIVE
Blood, UA: NEGATIVE
Glucose, UA: NEGATIVE mg/dL
Ketones, POC UA: NEGATIVE mg/dL
Leukocytes, UA: NEGATIVE
Nitrite, UA: NEGATIVE
Protein Ur, POC: NEGATIVE mg/dL
Spec Grav, UA: 1.015 (ref 1.010–1.025)
Urobilinogen, UA: 0.2 E.U./dL
pH, UA: 6 (ref 5.0–8.0)

## 2022-11-12 LAB — CBC WITH DIFFERENTIAL/PLATELET
Abs Immature Granulocytes: 0.01 10*3/uL (ref 0.00–0.07)
Basophils Absolute: 0 10*3/uL (ref 0.0–0.1)
Basophils Relative: 1 %
Eosinophils Absolute: 0.1 10*3/uL (ref 0.0–0.5)
Eosinophils Relative: 2 %
HCT: 39.7 % (ref 36.0–46.0)
Hemoglobin: 13.4 g/dL (ref 12.0–15.0)
Immature Granulocytes: 0 %
Lymphocytes Relative: 38 %
Lymphs Abs: 2 10*3/uL (ref 0.7–4.0)
MCH: 32.2 pg (ref 26.0–34.0)
MCHC: 33.8 g/dL (ref 30.0–36.0)
MCV: 95.4 fL (ref 80.0–100.0)
Monocytes Absolute: 0.4 10*3/uL (ref 0.1–1.0)
Monocytes Relative: 8 %
Neutro Abs: 2.8 10*3/uL (ref 1.7–7.7)
Neutrophils Relative %: 51 %
Platelets: 283 10*3/uL (ref 150–400)
RBC: 4.16 MIL/uL (ref 3.87–5.11)
RDW: 12.9 % (ref 11.5–15.5)
WBC: 5.4 10*3/uL (ref 4.0–10.5)
nRBC: 0 % (ref 0.0–0.2)

## 2022-11-12 LAB — COMPREHENSIVE METABOLIC PANEL
ALT: 14 U/L (ref 0–44)
AST: 14 U/L — ABNORMAL LOW (ref 15–41)
Albumin: 4 g/dL (ref 3.5–5.0)
Alkaline Phosphatase: 83 U/L (ref 38–126)
Anion gap: 8 (ref 5–15)
BUN: 9 mg/dL (ref 6–20)
CO2: 24 mmol/L (ref 22–32)
Calcium: 8.8 mg/dL — ABNORMAL LOW (ref 8.9–10.3)
Chloride: 106 mmol/L (ref 98–111)
Creatinine, Ser: 0.73 mg/dL (ref 0.44–1.00)
GFR, Estimated: >60 mL/min (ref >60)
Glucose, Bld: 103 mg/dL — ABNORMAL HIGH (ref 70–99)
Potassium: 3.6 mmol/L (ref 3.5–5.1)
Sodium: 138 mmol/L (ref 135–145)
Total Bilirubin: 0.3 mg/dL (ref 0.3–1.2)
Total Protein: 6.5 g/dL (ref 6.5–8.1)

## 2022-11-12 LAB — CBG MONITORING, ED: Glucose-Capillary: 89 mg/dL (ref 70–99)

## 2022-11-12 LAB — LIPASE, BLOOD: Lipase: 47 U/L (ref 11–51)

## 2022-11-12 MED ORDER — HYDROMORPHONE HCL 1 MG/ML IJ SOLN
1.0000 mg | Freq: Once | INTRAMUSCULAR | Status: AC
  Administered 2022-11-12: 1 mg via INTRAMUSCULAR
  Filled 2022-11-12: qty 1

## 2022-11-12 MED ORDER — CYCLOBENZAPRINE HCL 10 MG PO TABS: 10.0000 mg | ORAL_TABLET | Freq: Two times a day (BID) | ORAL | 0 refills | Status: DC | PRN

## 2022-11-12 MED ORDER — LIDOCAINE 5 % EX PTCH
1.0000 | MEDICATED_PATCH | CUTANEOUS | Status: DC
  Administered 2022-11-12: 1 via TRANSDERMAL
  Filled 2022-11-12: qty 1

## 2022-11-12 MED ORDER — FENTANYL CITRATE PF 50 MCG/ML IJ SOSY
50.0000 ug | PREFILLED_SYRINGE | Freq: Once | INTRAMUSCULAR | Status: AC
  Administered 2022-11-12: 50 ug via INTRAVENOUS
  Filled 2022-11-12: qty 1

## 2022-11-12 MED ORDER — IOHEXOL 350 MG/ML SOLN
100.0000 mL | Freq: Once | INTRAVENOUS | Status: AC | PRN
Start: 2022-11-12 — End: 2022-11-12
  Administered 2022-11-12: 80 mL via INTRAVENOUS

## 2022-11-12 MED ORDER — LIDOCAINE 5 % EX PTCH
1.0000 | MEDICATED_PATCH | CUTANEOUS | 0 refills | Status: AC
Start: 2022-11-12 — End: ?

## 2022-11-12 NOTE — ED Provider Notes (Signed)
[facility] Provider Note   CSN: [id number] Arrival date & time: 11/12/22  1622     History  Chief Complaint  Patient presents with   Flank Pain    Suzanne Gutierrez is a 57 y.o. female with a history of diabetes, osteoarthritis, and hypertension who presents to the ED today for LUQ pain.  Patient reports that she was on vacation 2 days ago when she started to have pain in her left upper quadrant that radiates up to her shoulder anteriorly. The pain has been constant since but worsens with inspiration. She denies nausea, vomiting, fever, dysuria, or changes to her bowel habits. Patient has taken Tylenol with no symptom relief. She has a history of multiple abdominal surgeries in the past.    Home Medications Prior to Admission medications   Medication Sig Start Date End Date Taking? Authorizing Provider  albuterol (VENTOLIN HFA) 108 (90 Base) MCG/ACT inhaler Inhale 2 puffs into the lungs 2 (two) times daily. 11/18/20   [provider]  ALPRAZolam Prudy Feeler) 0.25 MG tablet Take 0.25 mg by mouth daily as needed for anxiety.  01/23/18   [provider]  buPROPion (WELLBUTRIN XL) 150 MG 24 hr tablet Take 150 mg by mouth every morning. 12/28/20   [provider]  busPIRone (BUSPAR) 10 MG tablet 1 TABLET ORALLY TWICE A DAY FOR ACUTE ANXIETY 90 DAYS    [provider]  Calcium Carb-Cholecalciferol (CALCIUM 600 + D PO) Take 1 tablet by mouth 3 (three) times daily.    [provider]  clotrimazole-betamethasone (LOTRISONE) cream Apply 1 application topically 2 (two) times daily. 08/21/19   Clark-Burning, Victorino Dike, PA-C  desvenlafaxine (PRISTIQ) 50 MG 24 hr tablet     [provider]  eletriptan (RELPAX) 40 MG tablet Take by mouth. 02/08/21   [provider]  escitalopram (LEXAPRO) 10 MG tablet Take 10 mg by mouth at bedtime.     [provider]  lidocaine (XYLOCAINE) 5 % ointment Apply 1  application topically daily as needed for mild pain. For numbing an area 07/24/17   [provider]  Multiple Vitamin (MULTIVITAMIN WITH MINERALS) TABS tablet Take 1 tablet by mouth daily.     [provider]  nitroGLYCERIN (NITRO-DUR) 0.2 mg/hr patch Place 1 patch (0.2 mg total) onto the skin daily. 12 hours on and 12 hours off 03/02/21   Hyatt, Max T, DPM  progesterone (PROMETRIUM) 200 MG capsule Take 200 mg by mouth every evening.    [provider]  promethazine-dextromethorphan (PROMETHAZINE-DM) 6.25-15 MG/5ML syrup Take 5 mLs by mouth 4 (four) times daily as needed for cough. 03/12/22   [name], MD  tamsulosin (FLOMAX) 0.4 MG CAPS capsule Take 1 capsule (0.4 mg total) by mouth daily. 01/24/22   [name], PA-C  topiramate (TOPAMAX) 100 MG tablet Take 100 mg by mouth 2 (two) times daily.    [provider]  traMADol (ULTRAM) 50 MG tablet Take 50 mg by mouth every 6 (six) hours as needed (migraines).  03/16/18   [provider]  valACYclovir (VALTREX) 500 MG tablet Take by mouth. 10/06/20   [provider]  pantoprazole (PROTONIX) 40 MG tablet Take 1 tablet (40 mg total) by mouth daily. 07/13/18 05/24/20  Ovidio Kin, MD      Allergies    Codeine and Nsaids    Review of Systems   Review of Systems  Genitourinary:  Positive for flank pain.    Physical  Exam Updated Vital Signs BP 120/84 (BP Location: Right Arm)   Pulse 78   Temp 98.1 F (36.7 C) (Oral)   Resp 16   LMP 08/28/2017 (Approximate)   SpO2 100%  Physical Exam Vitals and nursing note reviewed.  Constitutional:      General: She is in acute distress.     Appearance: Normal appearance.     Comments: She is teary eyed on exam  HENT:     Head: Normocephalic and atraumatic.     Mouth/Throat:     Mouth: Mucous membranes are moist.  Eyes:     Conjunctiva/sclera: Conjunctivae normal.     Pupils: Pupils are equal, round, and reactive to light.   Cardiovascular:     Rate and Rhythm: Normal rate and regular rhythm.     Pulses: Normal pulses.  Pulmonary:     Effort: Pulmonary effort is normal.     Breath sounds: Normal breath sounds.  Abdominal:     Palpations: Abdomen is soft.     Tenderness: There is abdominal tenderness.     Comments: LUQ pain  Skin:    General: Skin is warm and dry.     Findings: No rash.  Neurological:     General: No focal deficit present.     Mental Status: She is alert.  Psychiatric:        Mood and Affect: Mood normal.        Behavior: Behavior normal.     ED Results / Procedures / Treatments   Labs (all labs ordered are listed, but only abnormal results are displayed) Labs Reviewed  COMPREHENSIVE METABOLIC PANEL - Abnormal; Notable for the following components:      Result Value   Glucose, Bld 103 (*)    Calcium 8.8 (*)    AST 14 (*)    All other components within normal limits  LIPASE, BLOOD  CBC WITH DIFFERENTIAL/PLATELET  CBG MONITORING, ED    EKG None  Radiology No results found.  Procedures Procedures: not indicated.   Medications Ordered in ED Medications  fentaNYL (SUBLIMAZE) injection 50 mcg (50 mcg Intravenous Given 11/12/22 1712)  iohexol (OMNIPAQUE) 350 MG/ML injection 100 mL (80 mLs Intravenous Contrast Given 11/12/22 1807)  HYDROmorphone (DILAUDID) injection 1 mg (1 mg Intramuscular Given 11/12/22 1844)    ED Course/ Medical Decision Making/ A&P                             Medical Decision Making  This patient presents to the ED for concern of radiating left upper quadrant pain, this involves an extensive number of treatment options, and is a complaint that carries with it a high risk of complications and morbidity.   Differential diagnosis includes: PE versus bowel perforation vs bowel obstruction vs gastroenteritis, etc.   Co morbidities that complicate the patient evaluation  Hypertension Diabetes mellitus History of gastric bypass   Additional  history obtained:  Additional history obtained from patient's records.   Lab Tests:  I ordered and personally interpreted labs - all within normal limits.   Imaging Studies ordered:  I ordered imaging studies including CT abdomen/pelvis with contrast and CT chest PE study I independently visualized and interpreted imaging which showed: Results pending at time of shift change. I agree with the radiologist interpretation   Problem List / ED Course / Critical interventions / Medication management  LUQ pain radiating to left shoulder I ordered medications including:  Fentanyl for  pain  Reevaluation of the patient after these medicines showed that the patient stayed the same Dilaudid given for pain after that. I have reviewed the patients home medicines and have made adjustments as needed   Social Determinants of Health:  Housing Social connection   Test / Admission - Considered:  Patient is hemodynamically stable. Disposition pending at time of shift change, dependent on CT results.        Final Clinical Impression(s) / ED Diagnoses Final diagnoses:  None    Rx / DC Orders ED Discharge Orders     None         Maxwell Marion, PA-C 11/12/22 1903    Tegeler, Canary Brim, MD 11/13/22 0001

## 2022-11-12 NOTE — Discharge Instructions (Signed)
Please follow-up with your primary care regarding recent symptoms and ER visit.  Today your CT and labs were all reassuring and you most likely have muscle skeletal pain.  Please pick up the muscle relaxer and lidocaine patches I prescribed for you and you may use Tylenol every 6 hours needed for pain.  Please use heat packs or ice packs whichever feels better and follow-up with your primary care provider.  If symptoms were to change or worsen please return to ER.

## 2022-11-12 NOTE — ED Provider Notes (Signed)
Patient given in sign out by Maxwell Marion, PA-C.  Please review their note for patient HPI, physical exam, workup.  At this time the plan is to follow imaging and reevaluate patient anticipate discharge.  Patient CT scan back reassuring.  While inspected the patient patient was tender in the left ribs when I palpated and did not have abdominal tenderness.  At this time suspect patient has MSK pain causing her symptoms with reassuring Cts, labs, physical exam.  I spoke to the patient at length about pain management that we could pursue and we agreed to do Tylenol every 6 hours, lidocaine patches, heat/ice packs, muscle relaxers and to have her follow-up with her primary care provider.  Patient verbalizes understanding and acceptance of this plan.  Patient stable for discharge at this time.   Remi Deter 11/12/22 1951    Tegeler, Canary Brim, MD 11/13/22 0001

## 2022-11-12 NOTE — ED Notes (Signed)
Patient is being discharged from the Urgent Care and sent to the Emergency Department via private vehicle . Per Laren Everts NP, patient is in need of higher level of care due to flank pain. Patient is aware and verbalizes understanding of plan of care.  Vitals:   11/12/22 1538  BP: 129/86  Pulse: 81  Resp: 17  Temp: 97.9 F (36.6 C)  SpO2: 95%

## 2022-11-12 NOTE — ED Triage Notes (Signed)
Left side flank pain for 2 days. Hurts when she takes deep breath.

## 2022-11-12 NOTE — ED Provider Notes (Signed)
[name]    CSN: [id number] Arrival date & time: 11/12/22  1455      History   Chief Complaint Chief Complaint  Patient presents with   Flank Pain    X2 days Left sided flank pain    HPI Suzanne Gutierrez is a 57 y.o. female.   Patient presents with left upper abdomen/rib pain that has been present for about 2 days.  Patient reports that it is rated 7/10 on pain scale and worsens with inspiration.  She reports that pain seems to radiate up to her left shoulder.  Denies shortness of breath.  Denies any injury to the area.  Denies nausea, vomiting, diarrhea, fever, body aches, chills.  Denies any urinary symptoms.   Flank Pain    Past Medical History:  Diagnosis Date   Bipolar disorder [facility])    patient denies; reports only having post partum depression 21 years ago    Elevated temperature    at PAT appt 99.3 ; denies fevers at home nor cold/flu sx; reports hot flahses due to menopause    GERD (gastroesophageal reflux disease)    Headache    Migraine   High cholesterol    per patient report    Hypertension    Lichen sclerosus of female genitalia    Morbid obesity with BMI of 40.0-44.9, adult (HCC)    Has been in the process of evaluation for GOP   Osteoarthritis    PONV (postoperative nausea and vomiting)     Patient Active Problem List   Diagnosis Date Noted   Arthropathy of lumbar facet joint 11/16/2020   Advanced osteoarthritis of spine 10/13/2020   Acute left-sided low back pain without sciatica 10/05/2020   Body mass index (BMI) 27.0-27.9, adult 10/05/2020   Elevated blood-pressure reading, without diagnosis of hypertension 10/05/2020   Numbness of hand 04/16/2020   Lichen sclerosus 09/13/2019   Digital mucous cyst 08/27/2019   Adhesive capsulitis of right shoulder 09/25/2018   Impingement syndrome of right shoulder region 08/14/2018   Migraine 03/27/2018   Morbid obesity with BMI of 40.0-44.9, adult (HCC) 12/25/2017   Costochondritis  08/08/2017   Diabetes mellitus without complication (HCC) 07/26/2017   Essential hypertension 07/24/2017   Left-sided chest wall pain 07/21/2017   DOE (dyspnea on exertion) 07/21/2017   Chest pain 07/21/2017   Abnormal finding on EKG 07/21/2017   Hemorrhoids 06/07/2017   Seasonal allergies 04/09/2011    Past Surgical History:  Procedure Laterality Date   APPENDECTOMY  1994   BIOPSY  07/13/2018   Procedure: BIOPSY;  Surgeon: Ovidio Kin, MD;  Location: [facility];  Service: General;;   breast lift Bilateral 05/2019   CARDIAC CATHETERIZATION  2016   [facility] --was told she had no significant disease.   CARDIAC CATHETERIZATION  2019   reports she had this done at [facility] at the same time as ECHO    CESAREAN SECTION     ESOPHAGOGASTRODUODENOSCOPY (EGD) WITH PROPOFOL N/A 07/13/2018   Procedure: ESOPHAGOGASTRODUODENOSCOPY (EGD) WITH PROPOFOL;  Surgeon: Ovidio Kin, MD;  Location: [facility];  Service: General;  Laterality: N/A;   GASTRIC ROUX-EN-Y N/A 12/25/2017   Procedure: LAPAROSCOPIC ROUX-EN-Y GASTRIC BYPASS WITH UPPER ENDOSCOPY, ERAS PATHWAY;  Surgeon: Glenna Fellows, MD;  Location: [facility];  Service: General;  Laterality: N/A;   HERNIA REPAIR     X2   NM MYOVIEW LTD  07/27/2017    LOW risk.  EF 67%.  No reversible perfusion noted.  There is  a fixed defect in the lateral wall toward the apex likely breast attenuation.   TRANSTHORACIC ECHOCARDIOGRAM  07/27/2017   No regional wall motion abnormality.  (Poor quality).  Mild RV dilation.  Mild LA dilation.   TUBAL LIGATION     WISDOM TOOTH EXTRACTION      OB History     Gravida  2   Para  2   Term  2   Preterm      AB      Living  2      SAB      IAB      Ectopic      Multiple      Live Births               Home Medications    Prior to Admission medications   Medication Sig Start Date End Date Taking? Authorizing Provider  busPIRone (BUSPAR) 10 MG tablet 1 TABLET ORALLY  TWICE A DAY FOR ACUTE ANXIETY 90 DAYS   Yes [provider]  clotrimazole-betamethasone (LOTRISONE) cream Apply 1 application topically 2 (two) times daily. 08/21/19  Yes Clark-Burning, Victorino Dike, PA-C  desvenlafaxine (PRISTIQ) 50 MG 24 hr tablet    Yes [provider]  eletriptan (RELPAX) 40 MG tablet Take by mouth. 02/08/21  Yes [provider]  topiramate (TOPAMAX) 100 MG tablet Take 100 mg by mouth 2 (two) times daily.   Yes [provider]  albuterol (VENTOLIN HFA) 108 (90 Base) MCG/ACT inhaler Inhale 2 puffs into the lungs 2 (two) times daily. 11/18/20   [provider]  ALPRAZolam Prudy Feeler) 0.25 MG tablet Take 0.25 mg by mouth daily as needed for anxiety.  01/23/18   [provider]  buPROPion (WELLBUTRIN XL) 150 MG 24 hr tablet Take 150 mg by mouth every morning. 12/28/20   [provider]  Calcium Carb-Cholecalciferol (CALCIUM 600 + D PO) Take 1 tablet by mouth 3 (three) times daily.    [provider]  escitalopram (LEXAPRO) 10 MG tablet Take 10 mg by mouth at bedtime.     [provider]  lidocaine (XYLOCAINE) 5 % ointment Apply 1 application topically daily as needed for mild pain. For numbing an area 07/24/17   [provider]  Multiple Vitamin (MULTIVITAMIN WITH MINERALS) TABS tablet Take 1 tablet by mouth daily.     [provider]  nitroGLYCERIN (NITRO-DUR) 0.2 mg/hr patch Place 1 patch (0.2 mg total) onto the skin daily. 12 hours on and 12 hours off 03/02/21   Hyatt, Max T, DPM  progesterone (PROMETRIUM) 200 MG capsule Take 200 mg by mouth every evening.    [provider]  promethazine-dextromethorphan (PROMETHAZINE-DM) 6.25-15 MG/5ML syrup Take 5 mLs by mouth 4 (four) times daily as needed for cough. 03/12/22   [name], MD  tamsulosin (FLOMAX) 0.4 MG CAPS capsule Take 1 capsule (0.4 mg total) by mouth daily. 01/24/22   [name], PA-C  traMADol (ULTRAM) 50 MG tablet Take  50 mg by mouth every 6 (six) hours as needed (migraines).  03/16/18   [provider]  valACYclovir (VALTREX) 500 MG tablet Take by mouth. 10/06/20   [provider]  pantoprazole (PROTONIX) 40 MG tablet Take 1 tablet (40 mg total) by mouth daily. 07/13/18 05/24/20  Ovidio Kin, MD    Family History Family History  Problem Relation Age of Onset   COPD Other    Cancer Other    Hypertension Other    Stroke Other  Diabetes Other    Dementia Mother    Diabetes Father        With diabetic retinopathy   Stroke Father    Stroke Sister    Other Brother        3 brothers with unknown history   Breast cancer Neg Hx     Social History Social History   Tobacco Use   Smoking status: Never   Smokeless tobacco: Never  Vaping Use   Vaping Use: Never used  Substance Use Topics   Alcohol use: Yes    Alcohol/week: 2.0 standard drinks of alcohol    Types: 2 Glasses of wine per week   Drug use: No     Allergies   Codeine and Nsaids   Review of Systems Review of Systems Per HPI  Physical Exam Triage Vital Signs ED Triage Vitals  Enc Vitals Group     BP 11/12/22 1538 129/86     Pulse Rate 11/12/22 1538 81     Resp 11/12/22 1538 17     Temp 11/12/22 1538 97.9 F (36.6 C)     Temp Source 11/12/22 1538 Oral     SpO2 11/12/22 1538 95 %     Weight 11/12/22 1534 150 lb (68 kg)     Height 11/12/22 1534 5\' 1"  (1.549 m)     Head Circumference --      Peak Flow --      Pain Score 11/12/22 1534 7     Pain Loc --      Pain Edu? --      Excl. in GC? --    No data found.  Updated Vital Signs BP 129/86 (BP Location: Left Arm)   Pulse 81   Temp 97.9 F (36.6 C) (Oral)   Resp 17   Ht 5\' 1"  (1.549 m)   Wt 150 lb (68 kg)   LMP 08/28/2017 (Approximate)   SpO2 95%   BMI 28.34 kg/m   Visual Acuity Right Eye Distance:   Left Eye Distance:   Bilateral Distance:    Right Eye Near:   Left Eye Near:    Bilateral Near:     Physical Exam Constitutional:       General: She is not in acute distress.    Appearance: Normal appearance. She is not toxic-appearing or diaphoretic.  HENT:     Head: Normocephalic and atraumatic.  Eyes:     Extraocular Movements: Extraocular movements intact.     Conjunctiva/sclera: Conjunctivae normal.  Cardiovascular:     Rate and Rhythm: Normal rate and regular rhythm.     Pulses: Normal pulses.     Heart sounds: Normal heart sounds.  Pulmonary:     Effort: Pulmonary effort is normal. No respiratory distress.     Breath sounds: Normal breath sounds. No stridor. No wheezing, rhonchi or rales.  Chest:     Chest wall: Tenderness present.       Comments: Patient has significant tenderness to palpation to left upper quadrant/left lower lateral rib area.  No obvious crepitus, discoloration, swelling noted. Abdominal:     General: Bowel sounds are normal. There is no distension.     Palpations: Abdomen is soft.     Tenderness: There is abdominal tenderness in the left upper quadrant.     Comments: Patient is significantly tender to left upper quadrant of abdomen.  No obvious swelling noted.  Neurological:     General: No focal deficit present.     Mental Status: She  is alert and oriented to person, place, and time. Mental status is at baseline.  Psychiatric:        Mood and Affect: Mood normal.        Behavior: Behavior normal.        Thought Content: Thought content normal.        Judgment: Judgment normal.      UC Treatments / Results  Labs (all labs ordered are listed, but only abnormal results are displayed) Labs Reviewed  POCT URINALYSIS DIP (MANUAL ENTRY)    EKG   Radiology No results found.  Procedures Procedures (including critical care time)  Medications Ordered in UC Medications - No data to display  Initial Impression / Assessment and Plan / UC Course  I have reviewed the triage vital signs and the nursing notes.  Pertinent labs & imaging results that were available during my care  of the patient were reviewed by me and considered in my medical decision making (see chart for details).     Could be rib related but given how tender patient is to palpation on exam and concern that it could be abdominal related as well, recommended to her that she go to the ER today for more advanced imaging then can be provided here in urgent care.  Patient was agreeable with plan.  Vital signs and patient stable at discharge.  Agree with patient self transport to the ER. Final Clinical Impressions(s) / UC Diagnoses   Final diagnoses:  Abdominal pain, left upper quadrant   Discharge Instructions   None    ED Prescriptions   None    PDMP not reviewed this encounter.   Gustavus Bryant, [place] 11/12/22 [phone]

## 2022-11-12 NOTE — ED Triage Notes (Signed)
Pt states that she has some left sided flank pain. x2days

## 2023-03-30 LAB — HM DEXA SCAN

## 2023-04-17 ENCOUNTER — Ambulatory Visit (INDEPENDENT_AMBULATORY_CARE_PROVIDER_SITE_OTHER): Payer: 59 | Admitting: Family Medicine

## 2023-04-17 VITALS — BP 108/76 | HR 84 | Ht 60.5 in | Wt 153.0 lb

## 2023-04-17 DIAGNOSIS — M810 Age-related osteoporosis without current pathological fracture: Secondary | ICD-10-CM | POA: Insufficient documentation

## 2023-04-17 LAB — COMPREHENSIVE METABOLIC PANEL
ALT: 20 U/L (ref 0–35)
AST: 21 U/L (ref 0–37)
Albumin: 4.1 g/dL (ref 3.5–5.2)
Alkaline Phosphatase: 78 U/L (ref 39–117)
BUN: 10 mg/dL (ref 6–23)
CO2: 25 meq/L (ref 19–32)
Calcium: 9.2 mg/dL (ref 8.4–10.5)
Chloride: 109 meq/L (ref 96–112)
Creatinine, Ser: 0.83 mg/dL (ref 0.40–1.20)
GFR: 78.54 mL/min (ref >60.00)
Glucose, Bld: 91 mg/dL (ref 70–99)
Potassium: 4.4 meq/L (ref 3.5–5.1)
Sodium: 142 meq/L (ref 135–145)
Total Bilirubin: 0.5 mg/dL (ref 0.2–1.2)
Total Protein: 6.7 g/dL (ref 6.0–8.3)

## 2023-04-17 LAB — PHOSPHORUS: Phosphorus: 3.3 mg/dL (ref 2.3–4.6)

## 2023-04-17 LAB — MAGNESIUM: Magnesium: 2.2 mg/dL (ref 1.5–2.5)

## 2023-04-17 NOTE — Patient Instructions (Signed)
Thank you for coming in today.   Please get labs today before you leave   Work with a trainer to work on hip and shoulder strength and some resistance training for osteopetrosis.  Consider PT.   You should hear soon about Prolia injections.   Keep me updated.

## 2023-04-17 NOTE — Progress Notes (Signed)
   [name], PhD, LAT, ATC acting as a scribe for Clementeen Graham, MD.  Suzanne Gutierrez is a 57 y.o. female who presents to [facility] at [facility] today for osteoporosis management. Mother hx of osteoporosis. Hx of lumbar fusion, L4-5.  DEXA scan (date, T-score): 03/30/23: Spine= -2.2, L-FN= -3.0, R-FN= -2.5 Prior treatment: none History of Hip, Spine, or Wrist Fx: none Heart disease or stroke: no Cancer: no Kidney Disease: no Gastric/Peptic Ulcer: no Gastric bypass surgery: yes- 2019 Severe GERD: yes- not severe Hx of seizures: no Age at Menopause: 57 y/o Calcium intake: yes, chewables, 500iU Vitamin D intake: yes- Hormone replacement therapy: estradiol,  Smoking history: never smoked Alcohol: yes- occasional Exercise: none Major dental work in past year: no Parents with hip/spine fracture: yes- mom, hip fx Height loss: yes, 62"-60.5"   Pertinent review of systems: No fevers or chills.  She does note bilateral shoulder and lateral hip pain  Relevant historical information: Bariatric surgery.  Positive family history for severe osteoporosis and fractures.   Exam:  BP 108/76   Pulse 84   Ht 5' 0.5" (1.537 m)   Wt 153 lb (69.4 kg)   LMP 08/28/2017 (Approximate)   SpO2 98%   BMI 29.39 kg/m  General: Well Developed, well nourished, and in no acute distress.   MSK: Normal low back and leg motion.  Normal gait.  Tenderness palpation bilateral lateral hips.    Lab and Radiology Results  Vitamin D when checked at outside hospital labs was adequate at 46.  This was March 2024    Assessment and Plan: 57 y.o. female with severe osteoporosis.  T-score -3 in the hip.  Maximize weightbearing exercise.  She is taking adequate calcium vitamin D currently.  Will work on authorization for [employer].  That is probably her best option after talking with her.  I am worried about esophagitis with history of bariatric surgery for oral bisphosphonates. Will get  labs now in preparation for Prolia. Recommended a personal trainer for weightbearing exercise training.  This also should help what I think is trochanteric bursitis and shoulder impingement bilaterally.  If needed we can add formal physical therapy.  PDMP not reviewed this encounter. Orders Placed This Encounter  Procedures   Comprehensive metabolic panel    Standing Status:   Future    Number of Occurrences:   1    Standing Expiration Date:   04/16/2024   Magnesium   Phosphorus   No orders of the defined types were placed in this encounter.    Discussed warning signs or symptoms. Please see discharge instructions. Patient expresses understanding.   The above documentation has been reviewed and is accurate and complete Clementeen Graham, M.D.

## 2023-04-18 NOTE — Progress Notes (Signed)
Labs look fine.  Okay to proceed to [employer]

## 2023-04-24 ENCOUNTER — Telehealth: Payer: Self-pay

## 2023-04-24 NOTE — Telephone Encounter (Signed)
-----   Message from Clementeen Graham sent at 04/17/2023 10:30 AM EST ----- Regarding: Prolia Please work on Prolia for the infusion center.  No rush on this.

## 2023-04-25 NOTE — Telephone Encounter (Signed)
Prolia VOB initiated via [email or web address]  Next Prolia inj DUE: new start

## 2023-04-26 NOTE — Telephone Encounter (Signed)
Prior Authorization REQUIRED for PROLIA  PA PROCESS DETAILS: Please complete the prior authorization form located at [email or web address] or call [phone].   ELIGIBLE FOR [employer] COPAY ASSISTANCE

## 2023-04-26 NOTE — Telephone Encounter (Signed)
Called pt to review coverage. Pt is agreeable to starting treatment with Prolia.   Pt is aware of $25 copay at time of visit.   We will be using the [employer] Copay Assistance Program.  Forwarding to [facility] for ordering/scheduling.

## 2023-04-26 NOTE — Telephone Encounter (Signed)
Prior Auth for [facility] APPROVED PA# [id number] Valid: 04/26/23-04/25/24  Buy and Annette Stable

## 2023-04-26 NOTE — Telephone Encounter (Signed)
Pt ready for scheduling on or after 04/26/23  Out-of-pocket cost due at time of visit: $346 If using [employer] Copay Assistance: $25  Primary: [employer] Choice Plus Prolia co-insurance: 20% (approximately $320.99) Admin fee co-insurance: 20% (approximately $25)  Deductible: $5,000 of $5,000 met  Prior Auth: APPROVED PA# [id number] Valid: 04/26/23-04/25/24  Secondary: N/A Prolia co-insurance:  Admin fee co-insurance:  Deductible:  Prior Auth:  PA# Valid:   ** This summary of benefits is an estimation of the patient's out-of-pocket cost. Exact cost may vary based on individual plan coverage.      Pt enrolled in [employer] copay assistance program.   Co-Pay Member ID: [id number] Available Benefits: $1,500.00

## 2023-05-01 NOTE — Telephone Encounter (Signed)
Patient scheduled for a nurse visit at our office. Is this correct or was she needing it sent to the infusion center?

## 2023-05-09 ENCOUNTER — Ambulatory Visit: Payer: 59

## 2023-05-09 DIAGNOSIS — M810 Age-related osteoporosis without current pathological fracture: Secondary | ICD-10-CM | POA: Diagnosis not present

## 2023-05-09 MED ORDER — DENOSUMAB 60 MG/ML ~~LOC~~ SOSY
60.0000 mg | PREFILLED_SYRINGE | Freq: Once | SUBCUTANEOUS | Status: AC
  Administered 2023-05-09: 60 mg via SUBCUTANEOUS

## 2023-05-09 NOTE — Telephone Encounter (Signed)
Last Prolia inj 05/09/23 Next Prolia inj due 11/08/23

## 2023-05-09 NOTE — Progress Notes (Signed)
Prior Auth: APPROVED PA# [id number] Valid: 04/26/23-04/25/24   Pt received Prolia inj, Amo, right upper arm, tolerated well.

## 2023-05-18 ENCOUNTER — Ambulatory Visit: Payer: 59 | Admitting: Podiatry

## 2023-05-18 ENCOUNTER — Other Ambulatory Visit: Payer: Self-pay | Admitting: Podiatry

## 2023-05-18 ENCOUNTER — Encounter: Payer: Self-pay | Admitting: Podiatry

## 2023-05-18 ENCOUNTER — Ambulatory Visit: Payer: 59

## 2023-05-18 DIAGNOSIS — S93602A Unspecified sprain of left foot, initial encounter: Secondary | ICD-10-CM | POA: Diagnosis not present

## 2023-05-18 DIAGNOSIS — M778 Other enthesopathies, not elsewhere classified: Secondary | ICD-10-CM

## 2023-05-18 DIAGNOSIS — M7752 Other enthesopathy of left foot: Secondary | ICD-10-CM | POA: Diagnosis not present

## 2023-05-22 NOTE — Progress Notes (Signed)
She presents today with a dorsal and dorsal lateral aspect of her left foot aching times about 4 days now she denies any injury states that there is been no swelling no black and blue she could not bear weight on it for a few days and I was having sharp shooting pains in the ankle and the leg now.  Objective: Vital signs are stable she is alert and oriented x 3.  Pulses are palpable.  Most reviewed reproducible pain is located in the fourth intermetatarsal space with pressure proximally.  She has no peroneal pain no plantar fascial pain no medial tendon pain no Achilles pain and she has good range of motion dorsiflexion plantarflexion of her digits.  Frontal plane range of motion is normal.  Radiographs do not demonstrate any osseous abnormalities.  No acute findings are noted.  Mild demineralization of the bone.  Assessment: Sprain fourth interdigital proximal ligament.  Plan: Discussed appropriate shoes and put her on a Medrol Dosepak.

## 2023-07-03 ENCOUNTER — Encounter: Payer: Self-pay | Admitting: Family Medicine

## 2023-07-03 ENCOUNTER — Ambulatory Visit (INDEPENDENT_AMBULATORY_CARE_PROVIDER_SITE_OTHER)
Admission: RE | Admit: 2023-07-03 | Discharge: 2023-07-03 | Disposition: A | Discharge status: Home / Self Care | Payer: 59 | Source: Ambulatory Visit | Attending: Family Medicine | Admitting: Family Medicine

## 2023-07-03 ENCOUNTER — Telehealth: Payer: Self-pay

## 2023-07-03 ENCOUNTER — Ambulatory Visit (INDEPENDENT_AMBULATORY_CARE_PROVIDER_SITE_OTHER): Payer: 59 | Admitting: Family Medicine

## 2023-07-03 VITALS — BP 122/80 | HR 78 | Ht 60.5 in | Wt 147.0 lb

## 2023-07-03 DIAGNOSIS — M545 Low back pain, unspecified: Secondary | ICD-10-CM | POA: Diagnosis not present

## 2023-07-03 DIAGNOSIS — G8929 Other chronic pain: Secondary | ICD-10-CM

## 2023-07-03 DIAGNOSIS — M542 Cervicalgia: Secondary | ICD-10-CM

## 2023-07-03 MED ORDER — OXYCODONE-ACETAMINOPHEN 5-325 MG PO TABS: 1.0000 | ORAL_TABLET | Freq: Three times a day (TID) | ORAL | 0 refills | Status: DC | PRN

## 2023-07-03 NOTE — Patient Instructions (Addendum)
Thank you for coming in today.  Please get an Xray today before you leave  I've referred you to Physical Therapy.  Let us know if you don't hear from them in one week.   I've sent a prescription for Oxycodone to your pharmacy.  Try using a heating pad  Check back in 6 weeks  TENS UNIT: This is helpful for muscle pain and spasm.   Search and Purchase a TENS 7000 2nd edition at  www.tenspros.com or www.Amazon.com It should be less than $30.     TENS unit instructions: Do not shower or bathe with the unit on Turn the unit off before removing electrodes or batteries If the electrodes lose stickiness add a drop of water to the electrodes after they are disconnected from the unit and place on plastic sheet. If you continued to have difficulty, call the TENS unit company to purchase more electrodes. Do not apply lotion on the skin area prior to use. Make sure the skin is clean and dry as this will help prolong the life of the electrodes. After use, always check skin for unusual red areas, rash or other skin difficulties. If there are any skin problems, does not apply electrodes to the same area. Never remove the electrodes from the unit by pulling the wires. Do not use the TENS unit or electrodes other than as directed. Do not change electrode placement without consultating your therapist or physician. Keep 2 fingers with between each electrode. Wear time ratio is 2:1, on to off times.    For example on for 30 minutes off for 15 minutes and then on for 30 minutes off for 15 minutes

## 2023-07-03 NOTE — Progress Notes (Signed)
I, Stevenson Clinch, CMA acting as a scribe for Clementeen Graham, MD.  Suzanne Gutierrez is a 58 y.o. female who presents to [facility] at [facility] today for neck and back pain. Pt was previously seen by Dr. Denyse Amass on 04/17/23 for osteoporosis management.  Today, pt c/o neck and back pain x 3 weeks, worsening over the weekend. Pt locates pain to bilateral neck, burning pain. Bilateral lower back pain flaring up since the weekend. Notes difficulty with mobility d/t pain. Has been working with person trainer, at times, unable to push through the pain in the gym.   Radiating pain: no LE numbness/tingling: no LE weakness: UE Aggravates: side-lying Treatments tried: Tylenol Arthritis, heat, ice  Pertinent review of systems: No fevers or chills  Relevant historical information: Diabetes and osteoporosis.  History of bariatric surgery. History of lumbar spine fusion L5-S1  Exam:  BP 122/80   Pulse 78   Ht 5' 0.5" (1.537 m)   Wt 147 lb (66.7 kg)   LMP 08/28/2017 (Approximate)   SpO2 98%   BMI 28.24 kg/m  General: Well Developed, well nourished, and in no acute distress.   MSK: C-spine: Normal appearing Nontender to palpation spinal midline. Tender palpation cervical paraspinal musculature. Decree cervical motion. Upper extremity strength is intact.  L-spine normal appearing Nontender to palpation spinal midline.  Tender palpation paraspinal musculature. Decreased lumbar motion. Lower extremity strength and reflexes are intact. Antalgic gait.    Lab and Radiology Results  X-ray images cervical spine and lumbar spine obtained today personally and independently interpreted.  C-spine: Acute fractures.  No severe DJD.  Lumbar spine: Scoliosis with left L5-S1 posterior fusion. No acute fractures no severe degenerative changes.  Await formal radiology review     Assessment and Plan: 58 y.o. female with acute exacerbation of chronic neck and low back pain.   Pain is severe especially in the low back today.  Plan for updated x-rays especially given her history of osteoporosis.  We talked about pain management she already has cyclobenzaprine and tizanidine that she has tried.  Recommend Tylenol arthritis.  Recommend avoiding oral NSAIDs given history of bariatric surgery.  Plan for physical therapy heating pad and TENS unit.  Limited oxycodone prescribed for severe pain.  Recheck in 6 weeks or sooner if needed.   PDMP reviewed during this encounter. Orders Placed This Encounter  Procedures   DG Lumbar Spine 2-3 Views    Standing Status:   Future    Expiration Date:   07/31/2023    Reason for Exam (SYMPTOM  OR DIAGNOSIS REQUIRED):   low back pain    Preferred imaging location?:   [facility]    Is patient pregnant?:   No   DG Cervical Spine 2 or 3 views    Standing Status:   Future    Expiration Date:   07/02/2024    Reason for Exam (SYMPTOM  OR DIAGNOSIS REQUIRED):   neck pain    Is patient pregnant?:   No    Preferred imaging location?:   [facility]   Ambulatory referral to Physical Therapy    Referral Priority:   Routine    Referral Type:   Physical Medicine    Referral Reason:   Specialty Services Required    Requested Specialty:   Physical Therapy    Number of Visits Requested:   1   Meds ordered this encounter  Medications   oxyCODONE-acetaminophen (PERCOCET/ROXICET) 5-325 MG tablet    Sig:  Take 1 tablet by mouth every 8 (eight) hours as needed for severe pain (pain score 7-10).    Dispense:  15 tablet    Refill:  0     Discussed warning signs or symptoms. Please see discharge instructions. Patient expresses understanding.   The above documentation has been reviewed and is accurate and complete Clementeen Graham, M.D.

## 2023-07-03 NOTE — Telephone Encounter (Signed)
CoverMyMeds.com KEY: [id number]  Oxycodone-APAP 5-325 mg

## 2023-07-04 NOTE — Telephone Encounter (Signed)
Prior Authorization initiated for OXYCODONE-APAP via [email or web address] KEY: [id number]

## 2023-07-04 NOTE — Telephone Encounter (Signed)
Prior Auth for [employer] APPROVED PA# [id number]

## 2023-07-17 ENCOUNTER — Encounter: Payer: Self-pay | Admitting: Family Medicine

## 2023-07-17 NOTE — Progress Notes (Signed)
Cervical spine x-ray shows some arthritis at C5-C6.

## 2023-07-17 NOTE — Progress Notes (Signed)
Low back x-ray shows arthritis especially at the base of the spine.

## 2023-07-17 NOTE — Therapy (Deleted)
 OUTPATIENT PHYSICAL THERAPY THORACOLUMBAR EVALUATION   Patient Name: Suzanne Gutierrez MRN: [id number] DOB:[birth year], 58 y.o., female Today's Date: 07/17/2023  END OF SESSION:   Past Medical History:  Diagnosis Date   Bipolar disorder [facility])    patient denies; reports only having post partum depression 21 years ago    Elevated temperature    at PAT appt 99.3 ; denies fevers at home nor cold/flu sx; reports hot flahses due to menopause    GERD (gastroesophageal reflux disease)    Headache    Migraine   High cholesterol    per patient report    Hypertension    Lichen sclerosus of female genitalia    Morbid obesity with BMI of 40.0-44.9, adult (HCC)    Has been in the process of evaluation for GOP   Osteoarthritis    PONV (postoperative nausea and vomiting)    Past Surgical History:  Procedure Laterality Date   APPENDECTOMY  1994   BIOPSY  07/13/2018   Procedure: BIOPSY;  Surgeon: Ovidio Kin, MD;  Location: [facility];  Service: General;;   breast lift Bilateral 05/2019   CARDIAC CATHETERIZATION  2016   [facility] --was told she had no significant disease.   CARDIAC CATHETERIZATION  2019   reports she had this done at [facility] at the same time as ECHO    CESAREAN SECTION     ESOPHAGOGASTRODUODENOSCOPY (EGD) WITH PROPOFOL N/A 07/13/2018   Procedure: ESOPHAGOGASTRODUODENOSCOPY (EGD) WITH PROPOFOL;  Surgeon: Ovidio Kin, MD;  Location: [facility];  Service: General;  Laterality: N/A;   GASTRIC ROUX-EN-Y N/A 12/25/2017   Procedure: LAPAROSCOPIC ROUX-EN-Y GASTRIC BYPASS WITH UPPER ENDOSCOPY, ERAS PATHWAY;  Surgeon: Glenna Fellows, MD;  Location: [facility];  Service: General;  Laterality: N/A;   HERNIA REPAIR     X2   NM MYOVIEW LTD  07/27/2017    LOW risk.  EF 67%.  No reversible perfusion noted.  There is a fixed defect in the lateral wall toward the apex likely breast attenuation.   TRANSTHORACIC ECHOCARDIOGRAM  07/27/2017   No regional wall motion  abnormality.  (Poor quality).  Mild RV dilation.  Mild LA dilation.   TUBAL LIGATION     WISDOM TOOTH EXTRACTION     Patient Active Problem List   Diagnosis Date Noted   Age-related osteoporosis without current pathological fracture 04/17/2023   Arthropathy of lumbar facet joint 11/16/2020   Advanced osteoarthritis of spine 10/13/2020   Acute left-sided low back pain without sciatica 10/05/2020   Body mass index (BMI) 27.0-27.9, adult 10/05/2020   Elevated blood-pressure reading, without diagnosis of hypertension 10/05/2020   Numbness of hand 04/16/2020   Lichen sclerosus 09/13/2019   Digital mucous cyst 08/27/2019   Adhesive capsulitis of right shoulder 09/25/2018   Impingement syndrome of right shoulder region 08/14/2018   Migraine 03/27/2018   Morbid obesity with BMI of 40.0-44.9, adult (HCC) 12/25/2017   Costochondritis 08/08/2017   Diabetes mellitus without complication (HCC) 07/26/2017   Essential hypertension 07/24/2017   Left-sided chest wall pain 07/21/2017   DOE (dyspnea on exertion) 07/21/2017   Chest pain 07/21/2017   Abnormal finding on EKG 07/21/2017   Hemorrhoids 06/07/2017   Seasonal allergies 04/09/2011    PCP: Clovis Riley, L.August Saucer, MD  REFERRING PROVIDER: Rodolph Bong, MD  REFERRING DIAG: [phone] (ICD-10-CM) - Chronic bilateral low back pain without sciatica M54.2 (ICD-10-CM) - Neck pain  Rationale for Evaluation and Treatment: Rehabilitation  THERAPY DIAG:  No diagnosis found.  ONSET DATE: ***  SUBJECTIVE:                                                                                                                                                                                           SUBJECTIVE STATEMENT: ***  Pt locates pain to bilateral neck, burning pain. Bilateral lower back pain flaring up since the weekend. Notes difficulty with mobility d/t pain. Has been working with person trainer, at times, unable to push through the pain in the  gym.   PERTINENT HISTORY:  Diabetes and osteoporosis.  History of bariatric surgery. History of lumbar spine fusion L5-S1    PAIN:  Are you having pain? Yes: NPRS scale: *** Pain location: *** Pain description: *** Aggravating factors: *** Relieving factors: ***  PRECAUTIONS: {Therapy precautions:24002}  RED FLAGS: {PT Red Flags:29287}   WEIGHT BEARING RESTRICTIONS: {Yes ***/No:24003}  FALLS:  Has patient fallen in last 6 months? {fallsyesno:27318}  LIVING ENVIRONMENT: Lives with: {OPRC lives with:25569::"lives with their family"} Lives in: {Lives in:25570} Stairs: {opstairs:27293} Has following equipment at home: {Assistive devices:23999}  OCCUPATION: ***  PLOF: {PLOF:24004}  PATIENT GOALS: ***  NEXT MD VISIT: ***  OBJECTIVE:  Note: Objective measures were completed at Evaluation unless otherwise noted.  DIAGNOSTIC FINDINGS:  Xray 07/15/23  Low back    IMPRESSION: 1. Posterior left L5-S1 fusion. 2. Multilevel degenerative disc disease most pronounced at L5-S1.  Cervical spine    IMPRESSION: Degenerative disc disease at C5-6.  PATIENT SURVEYS:  Patient-specific activity functional scoring scheme (Point to one number):  "0" represents "unable to perform." "10" represents "able to perform at prior level. 0 1 2 3 4 5 6 7 8 9  10 (Date and Score) Activity Initial  Activity Eval                      Additional Additional Total score = sum of the activity scores/number of activities Minimum detectable change (90%CI) for average score = 2 points Minimum detectable change (90%CI) for single activity score = 3 points PSFS developed by: Jake Seats., & Binkley, J. (1995). Assessing disability and change on individual  patients: a report of a patient specific measure. Physiotherapy [place], 47, [phone]. Reproduced with the permission of the authors  Score: ***   COGNITION: Overall cognitive status:  {cognition:24006}     SENSATION: {sensation:27233}  MUSCLE LENGTH: Hamstrings: Right *** deg; Left *** deg Maisie Fus test: Right *** deg; Left *** deg  POSTURE: {posture:25561}  PALPATION: ***  LUMBAR ROM:   AROM eval  Flexion   Extension   Right lateral flexion   Left lateral flexion   Right  rotation   Left rotation    (Blank rows = not tested)    LE Measurements Lower Extremity Right EVAL Left EVAL   A/PROM MMT A/PROM MMT  Hip Flexion      Hip Extension      Hip Abduction      Hip Adduction      Hip Internal rotation      Hip External rotation      Knee Flexion      Knee Extension      Ankle Dorsiflexion      Ankle Plantarflexion      Ankle Inversion      Ankle Eversion       (Blank rows = not tested) * pain   LUMBAR SPECIAL TESTS:  {lumbar special test:25242}  FUNCTIONAL TESTS:  {Functional tests:24029}  GAIT: Distance walked: *** Assistive device utilized: {Assistive devices:23999} Level of assistance: {Levels of assistance:24026} Comments: ***  TREATMENT DATE: ***                                                                                                                              07/17/2023  Therapeutic Exercise:  Aerobic: Supine: Prone:  Seated:  Standing: Neuromuscular Re-education: Manual Therapy: Therapeutic Activity: Self Care: Trigger Point Dry Needling:  Modalities:     PATIENT EDUCATION:  Education details: on current presentation, on HEP, on clinical outcomes score and POC Person educated: Patient Education method: Explanation, Demonstration, and Handouts Education comprehension: verbalized understanding   HOME EXERCISE PROGRAM: ***  ASSESSMENT:  CLINICAL IMPRESSION: Patient is a *** y.o. *** who was seen today for physical therapy evaluation and treatment for ***.   OBJECTIVE IMPAIRMENTS: {opptimpairments:25111}.   ACTIVITY LIMITATIONS: {activitylimitations:27494}  PARTICIPATION LIMITATIONS:  {participationrestrictions:25113}  PERSONAL FACTORS: {Personal factors:25162} are also affecting patient's functional outcome.   REHAB POTENTIAL: {rehabpotential:25112}  CLINICAL DECISION MAKING: {clinical decision making:25114}  EVALUATION COMPLEXITY: {Evaluation complexity:25115}   GOALS: Goals reviewed with patient? yes  SHORT TERM GOALS: Target date: {follow up:25551} *** MAKE TEXT EDITABLE Patient will be independent in self management strategies to improve quality of life and functional outcomes. Baseline: New Program Goal status: INITIAL  2.  Patient will report at least 50% improvement in overall symptoms and/or function to demonstrate improved functional mobility Baseline: 0% better Goal status: INITIAL  3.  *** Baseline:  Goal status: INITIAL  4.  *** Baseline:  Goal status: INITIAL    LONG TERM GOALS: Target date: {follow up:25551} *** MAKE TEXT EDITABLE  Patient will report at least 75% improvement in overall symptoms and/or function to demonstrate improved functional mobility Baseline: 0% better Goal status: INITIAL  2.  Patient will score at least 2 points higher on PSFS average to demonstrate change in overall function. Baseline: see above Goal status: INITIAL  3.  *** Baseline:  Goal status: INITIAL  4.  *** Baseline:  Goal status: INITIAL   PLAN:  PT FREQUENCY: {rehab frequency:25116}  PT DURATION: {rehab duration:25117}  PLANNED INTERVENTIONS: 97110-Therapeutic exercises, 97530- Therapeutic activity, [id number]- Neuromuscular re-education, [phone]- Self Care, 19147- Manual therapy, [phone]- Gait training, [phone]- Orthotic Fit/training, [phone]- Canalith repositioning, [id number]- Aquatic Therapy, 97014- Electrical stimulation (unattended), [phone]- Ionotophoresis 4mg /ml Dexamethasone, Patient/Family education, Balance training, Stair training, Taping, Dry Needling, Joint mobilization, Joint manipulation, Spinal manipulation, Spinal mobilization, Cryotherapy,  and Moist heat   PLAN FOR NEXT SESSION: ***   12:06 PM, 07/17/23 Tereasa Coop, DPT Physical Therapy with [facility]

## 2023-07-18 ENCOUNTER — Ambulatory Visit: Payer: 59 | Admitting: Physical Therapy

## 2023-07-18 ENCOUNTER — Telehealth: Payer: Self-pay

## 2023-07-18 NOTE — Telephone Encounter (Signed)
Pt called back to review results. Pt wants to confirm that XR's show cause of her pain. She was supposed to start PT today but the therapist had to r/s d/t illness. Pt wonders that other options she may have for pain management.   Pt also requests refill on Oxycodone, she normally breaks it in half and takes it at bedtime.   Forwarding to Dr. Denyse Amass to review and advise.

## 2023-07-20 MED ORDER — OXYCODONE-ACETAMINOPHEN 5-325 MG PO TABS: 1.0000 | ORAL_TABLET | Freq: Three times a day (TID) | ORAL | 0 refills | Status: DC | PRN

## 2023-07-20 NOTE — Telephone Encounter (Signed)
 Oxycodone refilled.

## 2023-07-20 NOTE — Addendum Note (Signed)
Addended by: Rodolph Bong on: 07/20/2023 08:30 AM   Modules accepted: Orders

## 2023-07-24 NOTE — Therapy (Unsigned)
 OUTPATIENT PHYSICAL THERAPY THORACOLUMBAR EVALUATION   Patient Name: Suzanne Gutierrez MRN: [id number] DOB:1965/08/18, 58 y.o., female Today's Date: 07/26/2023  END OF SESSION:  PT End of Session - 07/26/23 0755     Visit Number 1    Number of Visits 16    Date for PT Re-Evaluation 10/18/23    Authorization Type [id number]    Authorization - Visit Number 1    Authorization - Number of Visits 40    Progress Note Due on Visit 10    PT Start Time 0800    PT Stop Time 0853    PT Time Calculation (min) 53 min    Behavior During Therapy WFL for tasks assessed/performed             Past Medical History:  Diagnosis Date   Bipolar disorder (HCC)    patient denies; reports only having post partum depression 21 years ago    Elevated temperature    at PAT appt 99.3 ; denies fevers at home nor cold/flu sx; reports hot flahses due to menopause    GERD (gastroesophageal reflux disease)    Headache    Migraine   High cholesterol    per patient report    Hypertension    Lichen sclerosus of female genitalia    Morbid obesity with BMI of 40.0-44.9, adult (HCC)    Has been in the process of evaluation for GOP   Osteoarthritis    PONV (postoperative nausea and vomiting)    Past Surgical History:  Procedure Laterality Date   APPENDECTOMY  1994   BIOPSY  07/13/2018   Procedure: BIOPSY;  Surgeon: Ovidio Kin, MD;  Location: [facility];  Service: General;;   breast lift Bilateral 05/2019   CARDIAC CATHETERIZATION  2016   [facility] --was told she had no significant disease.   CARDIAC CATHETERIZATION  2019   reports she had this done at [facility] at the same time as ECHO    CESAREAN SECTION     ESOPHAGOGASTRODUODENOSCOPY (EGD) WITH PROPOFOL N/A 07/13/2018   Procedure: ESOPHAGOGASTRODUODENOSCOPY (EGD) WITH PROPOFOL;  Surgeon: Ovidio Kin, MD;  Location: [facility];  Service: General;  Laterality: N/A;   GASTRIC ROUX-EN-Y N/A 12/25/2017   Procedure: LAPAROSCOPIC  ROUX-EN-Y GASTRIC BYPASS WITH UPPER ENDOSCOPY, ERAS PATHWAY;  Surgeon: Glenna Fellows, MD;  Location: [facility];  Service: General;  Laterality: N/A;   HERNIA REPAIR     X2   NM MYOVIEW LTD  07/27/2017    LOW risk.  EF 67%.  No reversible perfusion noted.  There is a fixed defect in the lateral wall toward the apex likely breast attenuation.   TRANSTHORACIC ECHOCARDIOGRAM  07/27/2017   No regional wall motion abnormality.  (Poor quality).  Mild RV dilation.  Mild LA dilation.   TUBAL LIGATION     WISDOM TOOTH EXTRACTION     Patient Active Problem List   Diagnosis Date Noted   Age-related osteoporosis without current pathological fracture 04/17/2023   Arthropathy of lumbar facet joint 11/16/2020   Advanced osteoarthritis of spine 10/13/2020   Acute left-sided low back pain without sciatica 10/05/2020   Body mass index (BMI) 27.0-27.9, adult 10/05/2020   Elevated blood-pressure reading, without diagnosis of hypertension 10/05/2020   Numbness of hand 04/16/2020   Lichen sclerosus 09/13/2019   Digital mucous cyst 08/27/2019   Adhesive capsulitis of right shoulder 09/25/2018   Impingement syndrome of right shoulder region 08/14/2018   Migraine 03/27/2018   Morbid obesity with BMI of  40.0-44.9, adult (HCC) 12/25/2017   Costochondritis 08/08/2017   Diabetes mellitus without complication (HCC) 07/26/2017   Essential hypertension 07/24/2017   Left-sided chest wall pain 07/21/2017   DOE (dyspnea on exertion) 07/21/2017   Chest pain 07/21/2017   Abnormal finding on EKG 07/21/2017   Hemorrhoids 06/07/2017   Seasonal allergies 04/09/2011    PCP: Clovis Riley, L.August Saucer, MD  REFERRING PROVIDER: Rodolph Bong, MD  REFERRING DIAG: [phone] (ICD-10-CM) - Chronic bilateral low back pain without sciatica M54.2 (ICD-10-CM) - Neck pain  Rationale for Evaluation and Treatment: Rehabilitation  THERAPY DIAG:  Cervicalgia - Plan: PT plan of care cert/re-cert  Other low back pain - Plan: PT plan  of care cert/re-cert  Muscle weakness (generalized) - Plan: PT plan of care cert/re-cert  ONSET DATE: neck pain - few months, back pain chronic  SUBJECTIVE:                                                                                                                                                                                           SUBJECTIVE STATEMENT: States that she has had chronic low back pain and had a lumbar fusion due to arthritis in her spine. States she has a lot of pain in her spine. States that the xray showed arthritis that is causing a lot of pain. States she is seeing a trainer who use to do PT .States the MD wants her to just go to PT for now.   States she doesn't mind coming to PT if it helps.   States she has a new diagnosis of osteoporosis. States she has started on injections.   States she is weak in her legs but doesn't have pain in her legs. States she has trouble getting up at times. States she sits a lot during the day.  States she has a yoga ball and stretches into flexion. States she has back spasms and stiffens up.   States her neck hurts all the time and that is new over the last couple months. States no UE symptoms.   Pt locates pain to bilateral neck, burning pain. Bilateral lower back pain flaring up since the weekend. Notes difficulty with mobility d/t pain. Has been working with person trainer, at times, unable to push through the pain in the gym.   Left arm has pain in upper arm at times    PERTINENT HISTORY:  Diabetes and osteoporosis.  History of bariatric surgery. Tummy tuck History of lumbar spine fusion L5-S1 - a couple years ago    PAIN:  Are you having pain? Yes: NPRS scale: 5/10   Pain location: low back and neck  pain Pain description: throbbing, burning Aggravating factors: sitting Relieving factors: laying over exercise ball, lose dose pain medication   Neck pain is 5/10  Description: hurts, achy on both sides, tender,  burning, Alleviating: nothing Aggravating: sitting  PRECAUTIONS: None  RED FLAGS: None   WEIGHT BEARING RESTRICTIONS: No  FALLS:  Has patient fallen in last 6 months? 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  DIAGNOSTIC FINDINGS:  Xray 07/15/23  Low back    IMPRESSION: 1. Posterior left L5-S1 fusion. 2. Multilevel degenerative disc disease most pronounced at L5-S1.  Cervical spine    IMPRESSION: Degenerative disc disease at C5-6.  PATIENT SURVEYS:  Patient-specific activity functional scoring scheme (Point to one number):  "0" represents "unable to perform." "10" represents "able to perform at prior level. 0 1 2 3 4 5 6 7 8 9  10 (Date and Score) Activity Initial  Activity Eval     Getting up from sitting                 Additional Additional Total score = sum of the activity scores/number of activities Minimum detectable change (90%CI) for average score = 2 points Minimum detectable change (90%CI) for single activity score = 3 points PSFS developed by: Jake Seats., & Binkley, J. (1995). Assessing disability and change on individual  patients: a report of a patient specific measure. Physiotherapy [place], 47, [phone]. Reproduced with the permission of the authors  Score:    COGNITION: Overall cognitive status: Within functional limits for tasks assessed     SENSATION: Not tested    POSTURE: rounded shoulders, forward head, decreased lumbar lordosis, increased thoracic kyphosis, posterior pelvic tilt, flexed trunk , and weight shift right  PALPATION: Tenderness to palpation along left glutes and lumbar spine, hypomobility noted with spring testing in lumbar spine    LUMBAR ROM:   AROM eval  Flexion 75% limited  Extension 100% limited*   Right lateral flexion 75% limited*  Left lateral flexion 50% limited  Right rotation   Left rotation    (Blank rows = not tested)  * pain   LE Measurements Lower Extremity Right EVAL Left EVAL   A/PROM MMT A/PROM MMT  Hip Flexion WFL 3+ WFL 3  Hip Extension WFL 3 WFL 3  Hip Abduction      Hip Adduction      Hip Internal rotation [facility]   Hip External rotation [facility]  WFL   Knee Flexion  3+  3+  Knee Extension  3+  3+  Ankle Dorsiflexion  4  4  Ankle Plantarflexion      Ankle Inversion      Ankle Eversion       (Blank rows = not tested) * pain   LUMBAR SPECIAL TESTS:  Slump test: Negative Breathing assessment: Limited diaphragmatic excursion primarily anterior chest breather ribs excessively splayed    TREATMENT DATE:  07/26/2023  Therapeutic Exercise:  On anatomy and rationale for different interventions : Supine: Prone: lying over 2 pillows--> over one pillow - education on why to perform 10 minutes  Seated:  Standing: Neuromuscular Re-education: long exhale breathing for improved core activation 5 minutes, posture/ergonomic setup - feet on floor - 10 minutes  Manual Therapy: Therapeutic Activity: Self Care: Trigger Point Dry Needling:  Modalities:     PATIENT EDUCATION:  Education details: on current presentation, on HEP, on clinical outcomes score and POC Person educated: Patient Education method: Programmer, multimedia, Demonstration, and Handouts Education comprehension: verbalized understanding   HOME EXERCISE PROGRAM: [id number]  ASSESSMENT:  CLINICAL IMPRESSION: Patient presents to physical therapy with complaints of chronic low back pain with history of bariatric surgery, lumbar fusion and tummy tuck that is likely contributing to current presentation.  Patient with recent diagnosis of osteoporosis and recent increase in neck pain  as well.  Patient presents limitations in posture, range of motion and strength that is likely contributing to current presentation.  Session focused on education as well as development of home exercise program.  Patient would greatly benefit from skilled PT to improve overall function and quality of life.  OBJECTIVE IMPAIRMENTS: decreased activity tolerance, decreased mobility, decreased ROM, decreased strength, improper body mechanics, postural dysfunction, and pain.   ACTIVITY LIMITATIONS: lifting, bending, standing, transfers, bed mobility, reach over head, and locomotion level  PARTICIPATION LIMITATIONS: cleaning, driving, and community activity  PERSONAL FACTORS: Age, Fitness, Time since onset of injury/illness/exacerbation, and 3+ comorbidities: lumbar fusion, tummy tuck, neck pain  are also affecting patient's functional outcome.   REHAB POTENTIAL: Good  CLINICAL DECISION MAKING: Evolving/moderate complexity  EVALUATION COMPLEXITY: Moderate   GOALS: Goals reviewed with patient? yes  SHORT TERM GOALS: Target date: 09/06/2023   Patient will be independent in self management strategies to improve quality of life and functional outcomes. Baseline: New Program Goal status: INITIAL  2.  Patient will report at least 50% improvement in overall symptoms and/or function to demonstrate improved functional mobility Baseline: 0% better Goal status: INITIAL  3.  Patient will demonstrate pain-free lumbar range of motion in all directions Baseline: Painful Goal status: INITIAL  4.  Patient will report improved ergonomic set up at work to reduce stress on neck and back Baseline: None currently Goal status: INITIAL    LONG TERM GOALS: Target date: 10/18/2023    Patient will report at least 75% improvement in overall symptoms and/or function to demonstrate improved functional mobility Baseline: 0% better Goal status: INITIAL  2.  Patient will score at least 2 points higher on PSFS  average to demonstrate change in overall function. Baseline: see above Goal status: INITIAL  3.  Patient will be able to demonstrate at least a 6-second exhale to demonstrate improved diaphragmatic excursion and core activation Baseline: Unable Goal status: INITIAL  4.  Patient will demonstrate at least 4 out of 5 manual muscle testing in bilateral lower extremities Baseline: See above Goal status: INITIAL   PLAN:  PT FREQUENCY: 1-2x/week  PT DURATION: 12 weeks  PLANNED INTERVENTIONS: 97110-Therapeutic exercises, 97530- Therapeutic activity, [id number]- Neuromuscular re-education, 97535- Self Care, [id number]- Manual therapy, [phone]- Gait training, [phone]- Orthotic Fit/training, [phone]- Canalith repositioning, [id number]- Aquatic Therapy, [id number]- Electrical stimulation (unattended), [phone]- Ionotophoresis 4mg /ml Dexamethasone, Patient/Family education, Balance training, Stair training, Taping, Dry Needling, Joint mobilization, Joint manipulation, Spinal manipulation, Spinal mobilization, Cryotherapy, and Moist heat   PLAN FOR NEXT SESSION: PSFS, posture/ergonomic position, breath work, hip and core strengthening, assess neck  9:07 AM, 07/26/23 Tereasa Coop, DPT Physical Therapy with [facility]

## 2023-07-26 ENCOUNTER — Ambulatory Visit (INDEPENDENT_AMBULATORY_CARE_PROVIDER_SITE_OTHER): Payer: 59 | Admitting: Physical Therapy

## 2023-07-26 ENCOUNTER — Encounter: Payer: Self-pay | Admitting: Physical Therapy

## 2023-07-26 DIAGNOSIS — M5459 Other low back pain: Secondary | ICD-10-CM | POA: Diagnosis not present

## 2023-07-26 DIAGNOSIS — M542 Cervicalgia: Secondary | ICD-10-CM | POA: Diagnosis not present

## 2023-07-26 DIAGNOSIS — M6281 Muscle weakness (generalized): Secondary | ICD-10-CM

## 2023-08-01 NOTE — Therapy (Deleted)
 OUTPATIENT PHYSICAL THERAPY THORACOLUMBAR TREATMENT   Patient Name: Suzanne Gutierrez MRN: [id number] DOB:21-Sep-1965, 58 y.o., female Today's Date: 08/01/2023  END OF SESSION:    Past Medical History:  Diagnosis Date   Bipolar disorder [facility])    patient denies; reports only having post partum depression 21 years ago    Elevated temperature    at PAT appt 99.3 ; denies fevers at home nor cold/flu sx; reports hot flahses due to menopause    GERD (gastroesophageal reflux disease)    Headache    Migraine   High cholesterol    per patient report    Hypertension    Lichen sclerosus of female genitalia    Morbid obesity with BMI of 40.0-44.9, adult (HCC)    Has been in the process of evaluation for GOP   Osteoarthritis    PONV (postoperative nausea and vomiting)    Past Surgical History:  Procedure Laterality Date   APPENDECTOMY  1994   BIOPSY  07/13/2018   Procedure: BIOPSY;  Surgeon: Ovidio Kin, MD;  Location: [facility];  Service: General;;   breast lift Bilateral 05/2019   CARDIAC CATHETERIZATION  2016   [facility] --was told she had no significant disease.   CARDIAC CATHETERIZATION  2019   reports she had this done at [facility] at the same time as ECHO    CESAREAN SECTION     ESOPHAGOGASTRODUODENOSCOPY (EGD) WITH PROPOFOL N/A 07/13/2018   Procedure: ESOPHAGOGASTRODUODENOSCOPY (EGD) WITH PROPOFOL;  Surgeon: Ovidio Kin, MD;  Location: [facility];  Service: General;  Laterality: N/A;   GASTRIC ROUX-EN-Y N/A 12/25/2017   Procedure: LAPAROSCOPIC ROUX-EN-Y GASTRIC BYPASS WITH UPPER ENDOSCOPY, ERAS PATHWAY;  Surgeon: Glenna Fellows, MD;  Location: [facility];  Service: General;  Laterality: N/A;   HERNIA REPAIR     X2   NM MYOVIEW LTD  07/27/2017    LOW risk.  EF 67%.  No reversible perfusion noted.  There is a fixed defect in the lateral wall toward the apex likely breast attenuation.   TRANSTHORACIC ECHOCARDIOGRAM  07/27/2017   No regional wall motion  abnormality.  (Poor quality).  Mild RV dilation.  Mild LA dilation.   TUBAL LIGATION     WISDOM TOOTH EXTRACTION     Patient Active Problem List   Diagnosis Date Noted   Age-related osteoporosis without current pathological fracture 04/17/2023   Arthropathy of lumbar facet joint 11/16/2020   Advanced osteoarthritis of spine 10/13/2020   Acute left-sided low back pain without sciatica 10/05/2020   Body mass index (BMI) 27.0-27.9, adult 10/05/2020   Elevated blood-pressure reading, without diagnosis of hypertension 10/05/2020   Numbness of hand 04/16/2020   Lichen sclerosus 09/13/2019   Digital mucous cyst 08/27/2019   Adhesive capsulitis of right shoulder 09/25/2018   Impingement syndrome of right shoulder region 08/14/2018   Migraine 03/27/2018   Morbid obesity with BMI of 40.0-44.9, adult (HCC) 12/25/2017   Costochondritis 08/08/2017   Diabetes mellitus without complication (HCC) 07/26/2017   Essential hypertension 07/24/2017   Left-sided chest wall pain 07/21/2017   DOE (dyspnea on exertion) 07/21/2017   Chest pain 07/21/2017   Abnormal finding on EKG 07/21/2017   Hemorrhoids 06/07/2017   Seasonal allergies 04/09/2011    PCP: Clovis Riley, L.August Saucer, MD  REFERRING PROVIDER: Rodolph Bong, MD  REFERRING DIAG: [phone] (ICD-10-CM) - Chronic bilateral low back pain without sciatica M54.2 (ICD-10-CM) - Neck pain  Rationale for Evaluation and Treatment: Rehabilitation  THERAPY DIAG:  No diagnosis found.  ONSET DATE:  neck pain - few months, back pain chronic  SUBJECTIVE:                                                                                                                                                                                           SUBJECTIVE STATEMENT: 08/01/2023  E VAL: States that she has had chronic low back pain and had a lumbar fusion due to arthritis in her spine. States she has a lot of pain in her spine. States that the xray showed arthritis  that is causing a lot of pain. States she is seeing a trainer who use to do PT .States the MD wants her to just go to PT for now.   States she doesn't mind coming to PT if it helps.   States she has a new diagnosis of osteoporosis. States she has started on injections.   States she is weak in her legs but doesn't have pain in her legs. States she has trouble getting up at times. States she sits a lot during the day.  States she has a yoga ball and stretches into flexion. States she has back spasms and stiffens up.   States her neck hurts all the time and that is new over the last couple months. States no UE symptoms.   Pt locates pain to bilateral neck, burning pain. Bilateral lower back pain flaring up since the weekend. Notes difficulty with mobility d/t pain. Has been working with person trainer, at times, unable to push through the pain in the gym.   Left arm has pain in upper arm at times    PERTINENT HISTORY:  Diabetes and osteoporosis.  History of bariatric surgery. Tummy tuck History of lumbar spine fusion L5-S1 - a couple years ago    PAIN:  Are you having pain? Yes: NPRS scale: 5/10   Pain location: low back and neck pain Pain description: throbbing, burning Aggravating factors: sitting Relieving factors: laying over exercise ball, lose dose pain medication   Neck pain is 5/10  Description: hurts, achy on both sides, tender, burning, Alleviating: nothing Aggravating: sitting  PRECAUTIONS: None  RED FLAGS: None   WEIGHT BEARING RESTRICTIONS: No  FALLS:  Has patient fallen in last 6 months? 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  DIAGNOSTIC FINDINGS:  Xray 07/15/23  Low back    IMPRESSION: 1. Posterior left L5-S1 fusion. 2. Multilevel degenerative disc disease most  pronounced at  L5-S1.  Cervical spine    IMPRESSION: Degenerative disc disease at C5-6.  PATIENT SURVEYS:  Patient-specific activity functional scoring scheme (Point to one number):  "0" represents "unable to perform." "10" represents "able to perform at prior level. 0 1 2 3 4 5 6 7 8 9  10 (Date and Score) Activity Initial  Activity Eval     Getting up from sitting                 Additional Additional Total score = sum of the activity scores/number of activities Minimum detectable change (90%CI) for average score = 2 points Minimum detectable change (90%CI) for single activity score = 3 points PSFS developed by: Jake Seats., & Binkley, J. (1995). Assessing disability and change on individual  patients: a report of a patient specific measure. Physiotherapy [place], 47, [phone]. Reproduced with the permission of the authors  Score:    COGNITION: Overall cognitive status: Within functional limits for tasks assessed     SENSATION: Not tested    POSTURE: rounded shoulders, forward head, decreased lumbar lordosis, increased thoracic kyphosis, posterior pelvic tilt, flexed trunk , and weight shift right  PALPATION: Tenderness to palpation along left glutes and lumbar spine, hypomobility noted with spring testing in lumbar spine    LUMBAR ROM:   AROM eval  Flexion 75% limited  Extension 100% limited*  Right lateral flexion 75% limited*  Left lateral flexion 50% limited  Right rotation   Left rotation    (Blank rows = not tested)  * pain   LE Measurements Lower Extremity Right EVAL Left EVAL   A/PROM MMT A/PROM MMT  Hip Flexion WFL 3+ WFL 3  Hip Extension WFL 3 WFL 3  Hip Abduction      Hip Adduction      Hip Internal rotation [facility]  [facility]   Hip External rotation [facility]  [facility]   Knee Flexion  3+  3+  Knee Extension  3+  3+  Ankle Dorsiflexion  4  4  Ankle Plantarflexion      Ankle Inversion      Ankle Eversion       (Blank rows = not  tested) * pain   LUMBAR SPECIAL TESTS:  Slump test: Negative Breathing assessment: Limited diaphragmatic excursion primarily anterior chest breather ribs excessively splayed    TREATMENT DATE:                                                                                                                               08/01/2023  Therapeutic Exercise:  On anatomy and rationale for different interventions : Supine: Prone: lying over 2 pillows--> over one pillow - education on why to perform 10 minutes  Seated:  Standing: Neuromuscular Re-education: long exhale breathing for improved core activation 5 minutes, posture/ergonomic setup - feet on floor - 10 minutes  Manual Therapy: Therapeutic Activity: Self Care: Trigger Point Dry Needling:  Modalities:  PATIENT EDUCATION:  Education details: on current presentation, on HEP, on clinical outcomes score and POC Person educated: Patient Education method: Programmer, multimedia, Demonstration, and Handouts Education comprehension: verbalized understanding   HOME EXERCISE PROGRAM: [id number]  ASSESSMENT:  CLINICAL IMPRESSION: 08/01/2023  EVAL: Patient presents to physical therapy with complaints of chronic low back pain with history of bariatric surgery, lumbar fusion and tummy tuck that is likely contributing to current presentation.  Patient with recent diagnosis of osteoporosis and recent increase in neck pain as well.  Patient presents limitations in posture, range of motion and strength that is likely contributing to current presentation.  Session focused on education as well as development of home exercise program.  Patient would greatly benefit from skilled PT to improve overall function and quality of life.  OBJECTIVE IMPAIRMENTS: decreased activity tolerance, decreased mobility, decreased ROM, decreased strength, improper body mechanics, postural dysfunction, and pain.   ACTIVITY LIMITATIONS: lifting, bending, standing, transfers, bed  mobility, reach over head, and locomotion level  PARTICIPATION LIMITATIONS: cleaning, driving, and community activity  PERSONAL FACTORS: Age, Fitness, Time since onset of injury/illness/exacerbation, and 3+ comorbidities: lumbar fusion, tummy tuck, neck pain  are also affecting patient's functional outcome.   REHAB POTENTIAL: Good  CLINICAL DECISION MAKING: Evolving/moderate complexity  EVALUATION COMPLEXITY: Moderate   GOALS: Goals reviewed with patient? yes  SHORT TERM GOALS: Target date: 09/06/2023   Patient will be independent in self management strategies to improve quality of life and functional outcomes. Baseline: New Program Goal status: INITIAL  2.  Patient will report at least 50% improvement in overall symptoms and/or function to demonstrate improved functional mobility Baseline: 0% better Goal status: INITIAL  3.  Patient will demonstrate pain-free lumbar range of motion in all directions Baseline: Painful Goal status: INITIAL  4.  Patient will report improved ergonomic set up at work to reduce stress on neck and back Baseline: None currently Goal status: INITIAL    LONG TERM GOALS: Target date: 10/18/2023    Patient will report at least 75% improvement in overall symptoms and/or function to demonstrate improved functional mobility Baseline: 0% better Goal status: INITIAL  2.  Patient will score at least 2 points higher on PSFS average to demonstrate change in overall function. Baseline: see above Goal status: INITIAL  3.  Patient will be able to demonstrate at least a 6-second exhale to demonstrate improved diaphragmatic excursion and core activation Baseline: Unable Goal status: INITIAL  4.  Patient will demonstrate at least 4 out of 5 manual muscle testing in bilateral lower extremities Baseline: See above Goal status: INITIAL   PLAN:  PT FREQUENCY: 1-2x/week  PT DURATION: 12 weeks  PLANNED INTERVENTIONS: 97110-Therapeutic exercises, 97530-  Therapeutic activity, 97112- Neuromuscular re-education, 97535- Self Care, 16109- Manual therapy, [phone]- Gait training, [phone]- Orthotic Fit/training, [phone]- Canalith repositioning, [id number]- Aquatic Therapy, [id number]- Electrical stimulation (unattended), [phone]- Ionotophoresis 4mg /ml Dexamethasone, Patient/Family education, Balance training, Stair training, Taping, Dry Needling, Joint mobilization, Joint manipulation, Spinal manipulation, Spinal mobilization, Cryotherapy, and Moist heat   PLAN FOR NEXT SESSION: PSFS, posture/ergonomic position, breath work, hip and core strengthening, assess neck    9:07 AM, 08/01/23 Tereasa Coop, DPT Physical Therapy with [facility]

## 2023-08-02 ENCOUNTER — Encounter: Admitting: Physical Therapy

## 2023-08-07 ENCOUNTER — Encounter: Payer: 59 | Admitting: Physical Therapy

## 2023-08-09 ENCOUNTER — Ambulatory Visit (INDEPENDENT_AMBULATORY_CARE_PROVIDER_SITE_OTHER): Payer: 59 | Admitting: Physical Therapy

## 2023-08-09 ENCOUNTER — Encounter: Payer: Self-pay | Admitting: Physical Therapy

## 2023-08-09 DIAGNOSIS — M5459 Other low back pain: Secondary | ICD-10-CM

## 2023-08-09 DIAGNOSIS — M542 Cervicalgia: Secondary | ICD-10-CM | POA: Diagnosis not present

## 2023-08-09 DIAGNOSIS — M6281 Muscle weakness (generalized): Secondary | ICD-10-CM

## 2023-08-09 NOTE — Therapy (Signed)
 OUTPATIENT PHYSICAL THERAPY THORACOLUMBAR TREATMENT   Patient Name: Suzanne Gutierrez MRN: [id number] DOB:02-05-1966, 58 y.o., female Today's Date: 08/09/2023  END OF SESSION:  PT End of Session - 08/09/23 1436     Visit Number 2    Number of Visits 16    Date for PT Re-Evaluation 10/18/23    Authorization Type [id number]    Authorization - Visit Number 2    Authorization - Number of Visits 40    Progress Note Due on Visit 10    PT Start Time 1436    PT Stop Time 1452    PT Time Calculation (min) 16 min    Behavior During Therapy WFL for tasks assessed/performed              Past Medical History:  Diagnosis Date   Bipolar disorder (HCC)    patient denies; reports only having post partum depression 21 years ago    Elevated temperature    at PAT appt 99.3 ; denies fevers at home nor cold/flu sx; reports hot flahses due to menopause    GERD (gastroesophageal reflux disease)    Headache    Migraine   High cholesterol    per patient report    Hypertension    Lichen sclerosus of female genitalia    Morbid obesity with BMI of 40.0-44.9, adult (HCC)    Has been in the process of evaluation for GOP   Osteoarthritis    PONV (postoperative nausea and vomiting)    Past Surgical History:  Procedure Laterality Date   APPENDECTOMY  1994   BIOPSY  07/13/2018   Procedure: BIOPSY;  Surgeon: Ovidio Kin, MD;  Location: [facility];  Service: General;;   breast lift Bilateral 05/2019   CARDIAC CATHETERIZATION  2016   [facility] --was told she had no significant disease.   CARDIAC CATHETERIZATION  2019   reports she had this done at [facility] at the same time as ECHO    CESAREAN SECTION     ESOPHAGOGASTRODUODENOSCOPY (EGD) WITH PROPOFOL N/A 07/13/2018   Procedure: ESOPHAGOGASTRODUODENOSCOPY (EGD) WITH PROPOFOL;  Surgeon: Ovidio Kin, MD;  Location: [facility];  Service: General;  Laterality: N/A;   GASTRIC ROUX-EN-Y N/A 12/25/2017   Procedure: LAPAROSCOPIC  ROUX-EN-Y GASTRIC BYPASS WITH UPPER ENDOSCOPY, ERAS PATHWAY;  Surgeon: Glenna Fellows, MD;  Location: [facility];  Service: General;  Laterality: N/A;   HERNIA REPAIR     X2   NM MYOVIEW LTD  07/27/2017    LOW risk.  EF 67%.  No reversible perfusion noted.  There is a fixed defect in the lateral wall toward the apex likely breast attenuation.   TRANSTHORACIC ECHOCARDIOGRAM  07/27/2017   No regional wall motion abnormality.  (Poor quality).  Mild RV dilation.  Mild LA dilation.   TUBAL LIGATION     WISDOM TOOTH EXTRACTION     Patient Active Problem List   Diagnosis Date Noted   Age-related osteoporosis without current pathological fracture 04/17/2023   Arthropathy of lumbar facet joint 11/16/2020   Advanced osteoarthritis of spine 10/13/2020   Acute left-sided low back pain without sciatica 10/05/2020   Body mass index (BMI) 27.0-27.9, adult 10/05/2020   Elevated blood-pressure reading, without diagnosis of hypertension 10/05/2020   Numbness of hand 04/16/2020   Lichen sclerosus 09/13/2019   Digital mucous cyst 08/27/2019   Adhesive capsulitis of right shoulder 09/25/2018   Impingement syndrome of right shoulder region 08/14/2018   Migraine 03/27/2018   Morbid obesity with BMI  of 40.0-44.9, adult (HCC) 12/25/2017   Costochondritis 08/08/2017   Diabetes mellitus without complication (HCC) 07/26/2017   Essential hypertension 07/24/2017   Left-sided chest wall pain 07/21/2017   DOE (dyspnea on exertion) 07/21/2017   Chest pain 07/21/2017   Abnormal finding on EKG 07/21/2017   Hemorrhoids 06/07/2017   Seasonal allergies 04/09/2011    PCP: Clovis Riley, L.August Saucer, MD  REFERRING PROVIDER: Rodolph Bong, MD  REFERRING DIAG: [phone] (ICD-10-CM) - Chronic bilateral low back pain without sciatica M54.2 (ICD-10-CM) - Neck pain  Rationale for Evaluation and Treatment: Rehabilitation  THERAPY DIAG:  Cervicalgia  Other low back pain  Muscle weakness (generalized)  ONSET DATE: neck  pain - few months, back pain chronic  SUBJECTIVE:                                                                                                                                                                                           SUBJECTIVE STATEMENT: 08/09/2023 Ergonomic set up is helping. Pain has been been about the same but yesterday she had a lot of pain/pressure on her chest she thought she was having an anxiety attack. Reports that today her left arm is really painful, more so then usually and it does not matter if she repositions it or moves it. She is concerned something is going on, she just overall feels unwell.   E VAL: States that she has had chronic low back pain and had a lumbar fusion due to arthritis in her spine. States she has a lot of pain in her spine. States that the xray showed arthritis that is causing a lot of pain. States she is seeing a trainer who use to do PT .States the MD wants her to just go to PT for now.   States she doesn't mind coming to PT if it helps.   States she has a new diagnosis of osteoporosis. States she has started on injections.   States she is weak in her legs but doesn't have pain in her legs. States she has trouble getting up at times. States she sits a lot during the day.  States she has a yoga ball and stretches into flexion. States she has back spasms and stiffens up.   States her neck hurts all the time and that is new over the last couple months. States no UE symptoms.   Pt locates pain to bilateral neck, burning pain. Bilateral lower back pain flaring up since the weekend. Notes difficulty with mobility d/t pain. Has been working with person trainer, at times, unable to push through the pain in the gym.   Left arm has pain  in upper arm at times    PERTINENT HISTORY:  Diabetes and osteoporosis.  History of bariatric surgery. Tummy tuck History of lumbar spine fusion L5-S1 - a couple years ago    PAIN:  Are you having pain?  Yes: NPRS scale: 8/10   Pain location: left shoulder  Pain description: throbbing, intense Aggravating factors: unsure Relieving factors: unsure   Neck pain is 5/10  Description: hurts, achy on both sides, tender, burning, Alleviating: nothing Aggravating: sitting  PRECAUTIONS: None  RED FLAGS: None   WEIGHT BEARING RESTRICTIONS: No  FALLS:  Has patient fallen in last 6 months? 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  DIAGNOSTIC FINDINGS:  Xray 07/15/23  Low back    IMPRESSION: 1. Posterior left L5-S1 fusion. 2. Multilevel degenerative disc disease most pronounced at L5-S1.  Cervical spine    IMPRESSION: Degenerative disc disease at C5-6.  PATIENT SURVEYS:  Patient-specific activity functional scoring scheme (Point to one number):  "0" represents "unable to perform." "10" represents "able to perform at prior level. 0 1 2 3 4 5 6 7 8 9  10 (Date and Score) Activity Initial  Activity Eval     Getting up from sitting                 Additional Additional Total score = sum of the activity scores/number of activities Minimum detectable change (90%CI) for average score = 2 points Minimum detectable change (90%CI) for single activity score = 3 points PSFS developed by: Jake Seats., & Binkley, J. (1995). Assessing disability and change on individual  patients: a report of a patient specific measure. Physiotherapy [place], 47, [phone]. Reproduced with the permission of the authors  Score:    COGNITION: Overall cognitive status: Within functional limits for tasks assessed     SENSATION: Not tested    POSTURE: rounded shoulders, forward head, decreased lumbar lordosis, increased thoracic kyphosis, posterior pelvic tilt, flexed trunk , and weight shift  right  PALPATION: Tenderness to palpation along left glutes and lumbar spine, hypomobility noted with spring testing in lumbar spine    LUMBAR ROM:   AROM eval  Flexion 75% limited  Extension 100% limited*  Right lateral flexion 75% limited*  Left lateral flexion 50% limited  Right rotation   Left rotation    (Blank rows = not tested)  * pain   LE Measurements Lower Extremity Right EVAL Left EVAL   A/PROM MMT A/PROM MMT  Hip Flexion WFL 3+ WFL 3  Hip Extension WFL 3 WFL 3  Hip Abduction      Hip Adduction      Hip Internal rotation [facility]   Hip External rotation [facility]  WFL   Knee Flexion  3+  3+  Knee Extension  3+  3+  Ankle Dorsiflexion  4  4  Ankle Plantarflexion      Ankle Inversion      Ankle Eversion       (Blank rows = not tested) * pain   LUMBAR SPECIAL TESTS:  Slump test: Negative Breathing assessment: Limited diaphragmatic excursion primarily anterior chest breather ribs excessively splayed    TREATMENT DATE:  08/09/2023  Therapeutic Exercise:   Supine: Prone:   Seated:  Standing: Neuromuscular Re-education  Manual Therapy: Therapeutic Activity: Self Care: BP and vitals take and patient educated in signs/symptoms of heart attack to address pt's concerns, BP today while sitting 124/90, educated on box breathing - how and why to perform  Trigger Point Dry Needling:  Modalities:     PATIENT EDUCATION:  Education details: on  MD f/u, on box breathing Person educated: Patient Education method: Explanation, Facilities manager, and Handouts Education comprehension: verbalized understanding   HOME EXERCISE PROGRAM: [id number]  ASSESSMENT:  CLINICAL IMPRESSION: 08/09/2023 Patient arrived with reports she is having intense left arm pain and had chest pain yesterday. BP take WNL today. Answered all question and instructed  patient to f/u with MD and if symptoms persist/worsen to go to emergency department. No known heart issues per patient or chart review. Session ended early secondary to patient symptoms. Will f/u and continue with POC next session.  EVAL: Patient presents to physical therapy with complaints of chronic low back pain with history of bariatric surgery, lumbar fusion and tummy tuck that is likely contributing to current presentation.  Patient with recent diagnosis of osteoporosis and recent increase in neck pain as well.  Patient presents limitations in posture, range of motion and strength that is likely contributing to current presentation.  Session focused on education as well as development of home exercise program.  Patient would greatly benefit from skilled PT to improve overall function and quality of life.  OBJECTIVE IMPAIRMENTS: decreased activity tolerance, decreased mobility, decreased ROM, decreased strength, improper body mechanics, postural dysfunction, and pain.   ACTIVITY LIMITATIONS: lifting, bending, standing, transfers, bed mobility, reach over head, and locomotion level  PARTICIPATION LIMITATIONS: cleaning, driving, and community activity  PERSONAL FACTORS: Age, Fitness, Time since onset of injury/illness/exacerbation, and 3+ comorbidities: lumbar fusion, tummy tuck, neck pain  are also affecting patient's functional outcome.   REHAB POTENTIAL: Good  CLINICAL DECISION MAKING: Evolving/moderate complexity  EVALUATION COMPLEXITY: Moderate   GOALS: Goals reviewed with patient? yes  SHORT TERM GOALS: Target date: 09/06/2023   Patient will be independent in self management strategies to improve quality of life and functional outcomes. Baseline: New Program Goal status: INITIAL  2.  Patient will report at least 50% improvement in overall symptoms and/or function to demonstrate improved functional mobility Baseline: 0% better Goal status: INITIAL  3.  Patient will demonstrate  pain-free lumbar range of motion in all directions Baseline: Painful Goal status: INITIAL  4.  Patient will report improved ergonomic set up at work to reduce stress on neck and back Baseline: None currently Goal status: INITIAL    LONG TERM GOALS: Target date: 10/18/2023    Patient will report at least 75% improvement in overall symptoms and/or function to demonstrate improved functional mobility Baseline: 0% better Goal status: INITIAL  2.  Patient will score at least 2 points higher on PSFS average to demonstrate change in overall function. Baseline: see above Goal status: INITIAL  3.  Patient will be able to demonstrate at least a 6-second exhale to demonstrate improved diaphragmatic excursion and core activation Baseline: Unable Goal status: INITIAL  4.  Patient will demonstrate at least 4 out of 5 manual muscle testing in bilateral lower extremities Baseline: See above Goal status: INITIAL   PLAN:  PT FREQUENCY: 1-2x/week  PT DURATION: 12 weeks  PLANNED INTERVENTIONS: 97110-Therapeutic exercises, 97530- Therapeutic activity, [id number]- Neuromuscular re-education, 97535- Self Care, [id number]- Manual therapy, [id number]- Gait training, [phone]- Orthotic Fit/training,  [id number]- Canalith repositioning, 62952- Aquatic Therapy, [id number]- Electrical stimulation (unattended), [phone]- Ionotophoresis 4mg /ml Dexamethasone, Patient/Family education, Balance training, Stair training, Taping, Dry Needling, Joint mobilization, Joint manipulation, Spinal manipulation, Spinal mobilization, Cryotherapy, and Moist heat   PLAN FOR NEXT SESSION: PSFS, posture/ergonomic position, breath work, hip and core strengthening, assess neck    3:10 PM, 08/09/23 Tereasa Coop, DPT Physical Therapy with [facility]

## 2023-08-14 ENCOUNTER — Ambulatory Visit (INDEPENDENT_AMBULATORY_CARE_PROVIDER_SITE_OTHER): Payer: 59 | Admitting: Family Medicine

## 2023-08-14 ENCOUNTER — Other Ambulatory Visit: Payer: Self-pay

## 2023-08-14 ENCOUNTER — Encounter: Payer: Self-pay | Admitting: Physical Therapy

## 2023-08-14 ENCOUNTER — Ambulatory Visit (INDEPENDENT_AMBULATORY_CARE_PROVIDER_SITE_OTHER): Payer: 59 | Admitting: Physical Therapy

## 2023-08-14 VITALS — BP 128/84 | HR 68 | Ht 60.5 in | Wt 143.0 lb

## 2023-08-14 DIAGNOSIS — M25512 Pain in left shoulder: Secondary | ICD-10-CM | POA: Diagnosis not present

## 2023-08-14 DIAGNOSIS — M5459 Other low back pain: Secondary | ICD-10-CM

## 2023-08-14 DIAGNOSIS — M542 Cervicalgia: Secondary | ICD-10-CM | POA: Diagnosis not present

## 2023-08-14 DIAGNOSIS — M6281 Muscle weakness (generalized): Secondary | ICD-10-CM

## 2023-08-14 DIAGNOSIS — G8929 Other chronic pain: Secondary | ICD-10-CM | POA: Diagnosis not present

## 2023-08-14 MED ORDER — OXYCODONE-ACETAMINOPHEN 5-325 MG PO TABS: 1.0000 | ORAL_TABLET | Freq: Three times a day (TID) | ORAL | 0 refills | Status: DC | PRN

## 2023-08-14 NOTE — Therapy (Signed)
 OUTPATIENT PHYSICAL THERAPY THORACOLUMBAR TREATMENT   Patient Name: Suzanne Gutierrez MRN: [id number] DOB:May 22, 1966, 58 y.o., female Today's Date: 08/14/2023  END OF SESSION:  PT End of Session - 08/14/23 0802     Visit Number 3    Number of Visits 16    Date for PT Re-Evaluation 10/18/23    Authorization Type [id number]    Authorization - Visit Number 3    Authorization - Number of Visits 40    Progress Note Due on Visit 10    PT Start Time 0803    PT Stop Time 0841    PT Time Calculation (min) 38 min    Behavior During Therapy WFL for tasks assessed/performed              Past Medical History:  Diagnosis Date   Bipolar disorder (HCC)    patient denies; reports only having post partum depression 21 years ago    Elevated temperature    at PAT appt 99.3 ; denies fevers at home nor cold/flu sx; reports hot flahses due to menopause    GERD (gastroesophageal reflux disease)    Headache    Migraine   High cholesterol    per patient report    Hypertension    Lichen sclerosus of female genitalia    Morbid obesity with BMI of 40.0-44.9, adult (HCC)    Has been in the process of evaluation for GOP   Osteoarthritis    PONV (postoperative nausea and vomiting)    Past Surgical History:  Procedure Laterality Date   APPENDECTOMY  1994   BIOPSY  07/13/2018   Procedure: BIOPSY;  Surgeon: Ovidio Kin, MD;  Location: [facility];  Service: General;;   breast lift Bilateral 05/2019   CARDIAC CATHETERIZATION  2016   [facility] --was told she had no significant disease.   CARDIAC CATHETERIZATION  2019   reports she had this done at [facility] at the same time as ECHO    CESAREAN SECTION     ESOPHAGOGASTRODUODENOSCOPY (EGD) WITH PROPOFOL N/A 07/13/2018   Procedure: ESOPHAGOGASTRODUODENOSCOPY (EGD) WITH PROPOFOL;  Surgeon: Ovidio Kin, MD;  Location: [facility];  Service: General;  Laterality: N/A;   GASTRIC ROUX-EN-Y N/A 12/25/2017   Procedure: LAPAROSCOPIC  ROUX-EN-Y GASTRIC BYPASS WITH UPPER ENDOSCOPY, ERAS PATHWAY;  Surgeon: Glenna Fellows, MD;  Location: [facility];  Service: General;  Laterality: N/A;   HERNIA REPAIR     X2   NM MYOVIEW LTD  07/27/2017    LOW risk.  EF 67%.  No reversible perfusion noted.  There is a fixed defect in the lateral wall toward the apex likely breast attenuation.   TRANSTHORACIC ECHOCARDIOGRAM  07/27/2017   No regional wall motion abnormality.  (Poor quality).  Mild RV dilation.  Mild LA dilation.   TUBAL LIGATION     WISDOM TOOTH EXTRACTION     Patient Active Problem List   Diagnosis Date Noted   Age-related osteoporosis without current pathological fracture 04/17/2023   Arthropathy of lumbar facet joint 11/16/2020   Advanced osteoarthritis of spine 10/13/2020   Acute left-sided low back pain without sciatica 10/05/2020   Body mass index (BMI) 27.0-27.9, adult 10/05/2020   Elevated blood-pressure reading, without diagnosis of hypertension 10/05/2020   Numbness of hand 04/16/2020   Lichen sclerosus 09/13/2019   Digital mucous cyst 08/27/2019   Adhesive capsulitis of right shoulder 09/25/2018   Impingement syndrome of right shoulder region 08/14/2018   Migraine 03/27/2018   Morbid obesity with BMI  of 40.0-44.9, adult (HCC) 12/25/2017   Costochondritis 08/08/2017   Diabetes mellitus without complication (HCC) 07/26/2017   Essential hypertension 07/24/2017   Left-sided chest wall pain 07/21/2017   DOE (dyspnea on exertion) 07/21/2017   Chest pain 07/21/2017   Abnormal finding on EKG 07/21/2017   Hemorrhoids 06/07/2017   Seasonal allergies 04/09/2011    PCP: Clovis Riley, L.August Saucer, MD  REFERRING PROVIDER: Rodolph Bong, MD  REFERRING DIAG: [phone] (ICD-10-CM) - Chronic bilateral low back pain without sciatica M54.2 (ICD-10-CM) - Neck pain  Rationale for Evaluation and Treatment: Rehabilitation  THERAPY DIAG:  Cervicalgia  Other low back pain  Muscle weakness (generalized)  ONSET DATE: neck  pain - few months, back pain chronic  SUBJECTIVE:                                                                                                                                                                                           SUBJECTIVE STATEMENT: 08/14/2023 States she went to the [place] clinic and they ruled out anything going on with her heart. States that  her xray and she has a cracked rib. States that not where she is having pain. States that her arm is still sore and feels achy and almost feels like it is going to fall out.  E VAL: States that she has had chronic low back pain and had a lumbar fusion due to arthritis in her spine. States she has a lot of pain in her spine. States that the xray showed arthritis that is causing a lot of pain. States she is seeing a trainer who use to do PT .States the MD wants her to just go to PT for now.   States she doesn't mind coming to PT if it helps.   States she has a new diagnosis of osteoporosis. States she has started on injections.   States she is weak in her legs but doesn't have pain in her legs. States she has trouble getting up at times. States she sits a lot during the day.  States she has a yoga ball and stretches into flexion. States she has back spasms and stiffens up.   States her neck hurts all the time and that is new over the last couple months. States no UE symptoms.   Pt locates pain to bilateral neck, burning pain. Bilateral lower back pain flaring up since the weekend. Notes difficulty with mobility d/t pain. Has been working with person trainer, at times, unable to push through the pain in the gym.   Left arm has pain in upper arm at times    PERTINENT HISTORY:  Diabetes and  osteoporosis.  History of bariatric surgery. Tummy tuck History of lumbar spine fusion L5-S1 - a couple years ago    PAIN:  Are you having pain? Yes: NPRS scale: 2/10   Pain location: left shoulder  Pain description: throbbing,  intense Aggravating factors: unsure Relieving factors: unsure   Neck pain is 5/10  Description: hurts, achy on both sides, tender, burning, Alleviating: nothing Aggravating: sitting  PRECAUTIONS: None  RED FLAGS: None   WEIGHT BEARING RESTRICTIONS: No  FALLS:  Has patient fallen in last 6 months? 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  DIAGNOSTIC FINDINGS:  Xray 07/15/23  Low back    IMPRESSION: 1. Posterior left L5-S1 fusion. 2. Multilevel degenerative disc disease most pronounced at L5-S1.  Cervical spine    IMPRESSION: Degenerative disc disease at C5-6.  PATIENT SURVEYS:  Patient-specific activity functional scoring scheme (Point to one number):  "0" represents "unable to perform." "10" represents "able to perform at prior level. 0 1 2 3 4 5 6 7 8 9  10 (Date and Score) Activity Initial  Activity Eval     Getting up from supine  8    Laying on stomach 0     Lifting anything with left arm 5   Opening jars with left hand 0     Additional Total score = sum of the activity scores/number of activities Minimum detectable change (90%CI) for average score = 2 points Minimum detectable change (90%CI) for single activity score = 3 points PSFS developed by: Jake Seats., & Binkley, J. (1995). Assessing disability and change on individual  patients: a report of a patient specific measure. Physiotherapy [place], 47, [phone]. Reproduced with the permission of the authors  Score: 13/40   COGNITION: Overall cognitive status: Within functional limits for tasks assessed     SENSATION: Not tested    POSTURE: rounded shoulders, forward head, decreased lumbar lordosis, increased thoracic kyphosis, posterior pelvic tilt, flexed trunk , and weight shift  right  PALPATION: Tenderness to palpation along left glutes and lumbar spine, hypomobility noted with spring testing in lumbar spine    LUMBAR ROM:   AROM eval  Flexion 75% limited  Extension 100% limited*  Right lateral flexion 75% limited*  Left lateral flexion 50% limited  Right rotation   Left rotation    (Blank rows = not tested)  * pain   LE Measurements Lower Extremity Right EVAL Left EVAL   A/PROM MMT A/PROM MMT  Hip Flexion WFL 3+ WFL 3  Hip Extension WFL 3 WFL 3  Hip Abduction      Hip Adduction      Hip Internal rotation [facility]   Hip External rotation [facility]  WFL   Knee Flexion  3+  3+  Knee Extension  3+  3+  Ankle Dorsiflexion  4  4  Ankle Plantarflexion      Ankle Inversion      Ankle Eversion       (Blank rows = not tested) * pain   LUMBAR SPECIAL TESTS:  Slump test: Negative Breathing assessment: Limited diaphragmatic excursion primarily anterior chest breather ribs excessively splayed    TREATMENT DATE:  08/14/2023  Therapeutic Exercise:   Supine: AAROM with dowel shoulder flexion 2 minutes, chest press with dowel 2 minutes, positional relief education, shoulder extension into table 5" holds B, 2 minutes, shoulder depression 2 minutes B Prone:   Seated:  Standing: Neuromuscular Re-education  Manual Therapy: percussion gun for vibration to left shoulder, STM to left shoulder - tolerated well  Therapeutic Activity: Self Care:  on MD f/u about unplanned weight loss and importance of addressing sooner than later.  Trigger Point Dry Needling:  Modalities: thermotherapy to left shoulder during supine interventions    PATIENT EDUCATION:  Education details: on  HEP, on MD follow up with recent weight loss unplanned, on safe use of percussion gun for pain management and avoiding fractured rib until healed.  Person  educated: Patient Education method: Explanation, Demonstration, and Handouts Education comprehension: verbalized understanding   HOME EXERCISE PROGRAM: [id number]  ASSESSMENT:  CLINICAL IMPRESSION: 08/14/2023 Focused on education and pain management. Dicussed MD f/uabout unplanned weight loss and recently identified rib fracture. Added new exercises to HEP. Reduced pain noted. Educated patient in safe use of percussion gun for pain management strategies. Will continue with current POC as tolerated.    EVAL: Patient presents to physical therapy with complaints of chronic low back pain with history of bariatric surgery, lumbar fusion and tummy tuck that is likely contributing to current presentation.  Patient with recent diagnosis of osteoporosis and recent increase in neck pain as well.  Patient presents limitations in posture, range of motion and strength that is likely contributing to current presentation.  Session focused on education as well as development of home exercise program.  Patient would greatly benefit from skilled PT to improve overall function and quality of life.  OBJECTIVE IMPAIRMENTS: decreased activity tolerance, decreased mobility, decreased ROM, decreased strength, improper body mechanics, postural dysfunction, and pain.   ACTIVITY LIMITATIONS: lifting, bending, standing, transfers, bed mobility, reach over head, and locomotion level  PARTICIPATION LIMITATIONS: cleaning, driving, and community activity  PERSONAL FACTORS: Age, Fitness, Time since onset of injury/illness/exacerbation, and 3+ comorbidities: lumbar fusion, tummy tuck, neck pain  are also affecting patient's functional outcome.   REHAB POTENTIAL: Good  CLINICAL DECISION MAKING: Evolving/moderate complexity  EVALUATION COMPLEXITY: Moderate   GOALS: Goals reviewed with patient? yes  SHORT TERM GOALS: Target date: 09/06/2023   Patient will be independent in self management strategies to improve quality of  life and functional outcomes. Baseline: New Program Goal status: INITIAL  2.  Patient will report at least 50% improvement in overall symptoms and/or function to demonstrate improved functional mobility Baseline: 0% better Goal status: INITIAL  3.  Patient will demonstrate pain-free lumbar range of motion in all directions Baseline: Painful Goal status: INITIAL  4.  Patient will report improved ergonomic set up at work to reduce stress on neck and back Baseline: None currently Goal status: INITIAL    LONG TERM GOALS: Target date: 10/18/2023    Patient will report at least 75% improvement in overall symptoms and/or function to demonstrate improved functional mobility Baseline: 0% better Goal status: INITIAL  2.  Patient will score at least 2 points higher on PSFS average to demonstrate change in overall function. Baseline: see above Goal status: INITIAL  3.  Patient will be able to demonstrate at least a 6-second exhale to demonstrate improved diaphragmatic excursion and core activation Baseline: Unable Goal status: INITIAL  4.  Patient will demonstrate at least 4 out of 5 manual muscle testing in bilateral lower extremities Baseline: See  above Goal status: INITIAL   PLAN:  PT FREQUENCY: 1-2x/week  PT DURATION: 12 weeks  PLANNED INTERVENTIONS: 97110-Therapeutic exercises, 97530- Therapeutic activity, 97112- Neuromuscular re-education, [phone]- Self Care, 13244- Manual therapy, [phone]- Gait training, [phone]- Orthotic Fit/training, [phone]- Canalith repositioning, [id number]- Aquatic Therapy, 97014- Electrical stimulation (unattended), [phone]- Ionotophoresis 4mg /ml Dexamethasone, Patient/Family education, Balance training, Stair training, Taping, Dry Needling, Joint mobilization, Joint manipulation, Spinal manipulation, Spinal mobilization, Cryotherapy, and Moist heat   PLAN FOR NEXT SESSION: PSFS, posture/ergonomic position, breath work, hip and core strengthening, assess neck     8:43 AM, 08/14/23 Tereasa Coop, DPT Physical Therapy with [facility]

## 2023-08-14 NOTE — Progress Notes (Signed)
 [name], PhD, LAT, ATC acting as a scribe for Clementeen Graham, MD.  Suzanne Gutierrez is a 58 y.o. female who presents to [facility] today for f/u neck and low back pain. Pt was last seen by Dr. Denyse Amass on 07/03/23 and was prescribed oxycodone, use existing muscle relaxers, Tylenol, heating pad, TENS unit, and was referred to PT, completing 3 visits.  Today, pt reports she was unable to stay at her last PT visit, 3/12, as she was experiencing L sided chest and L shoulder pain and was seen at the [place] walk in clinic. She stated XR shoulder a rib fx. Pt thinks she injured her rib choking on a Tylenol and trying to dislodge it. Decreased strength. Neck and back pain is about the same.  The x-ray report from [facility] showed a normal shoulder x-ray and a possible nondisplaced rib fracture in the left lower anterior chest. She is not particularly painful in the chest wall in that region.  She notes considerable pain in the left shoulder into the left upper arm and some pain rating below the level of the elbow and the left arm.  Dx imaging: 07/03/23 L-spine & c-spine XR  Pertinent review of systems: No fevers or chills  Relevant historical information: Diabetes Osteoporosis  Exam:  BP 128/84   Pulse 68   Ht 5' 0.5" (1.537 m)   Wt 143 lb (64.9 kg)   LMP 08/28/2017 (Approximate)   SpO2 98%   BMI 27.47 kg/m  General: Well Developed, well nourished, and in no acute distress.   MSK: C-spine: Normal appearing Nontender palpation midline to palpation paraspinal musculature. Decreased cervical motion.  Upper extremity strength intact below the level of the elbow otherwise see shoulder exam. Reflexes are intact.  Left shoulder normal-appearing decreased motion pain with abduction and functional internal rotation. Strength decreased abduction and external rotation.  Positive impingement testing.   Lab and Radiology Results  Diagnostic Limited MSK Ultrasound of:  Left shoulder Biceps tendon intact normal appearing. Subscapularis tendon is intact without visible tear. Supraspinatus tendon intact without tear.  No significant subacromial bursitis. Infraspinatus tendon intact without visible tear. AC joint mild effusion and DJD Impression: No severe subacromial bursitis or visible retracted rotator cuff tear.      Assessment and Plan: 58 y.o. female with chronic left shoulder and arm pain.  Etiology is unclear.  She has pain that is typical for shoulder etiology but ultrasound was largely benign.  X-ray at [place] was also unremarkable.  Plan for MRI to further characterize source of pain and for surgery or injection planning.  Anticipate recheck after MRI.  Additionally she has cervical radiculopathy pain on the left side thought to be C6 or perhaps C5.  X-ray does show some degeneration in this area.  Again not improving with PT proceed to MRI C-spine.  Plan for MRI.  Oxycodone refilled.  Recheck once we get results back. PDMP not reviewed this encounter. Orders Placed This Encounter  Procedures   [place] LIMITED JOINT SPACE STRUCTURES UP LEFT(NO LINKED CHARGES)    Reason for Exam (SYMPTOM  OR DIAGNOSIS REQUIRED):   lt shoulder    Preferred imaging location?:   [facility]   MR CERVICAL SPINE WO CONTRAST    Standing Status:   Future    Expiration Date:   09/14/2023    What is the patient's sedation requirement?:   No Sedation    Does the patient have a pacemaker or  implanted devices?:   No    Preferred imaging location?:   [facility] (table limit-550lbs)   MR SHOULDER LEFT WO CONTRAST    Standing Status:   Future    Expiration Date:   08/13/2024    What is the patient's sedation requirement?:   No Sedation    Does the patient have a pacemaker or implanted devices?:   No    Preferred imaging location?:   [facility] (table limit-550lbs)   Meds ordered this encounter  Medications   oxyCODONE-acetaminophen  (PERCOCET/ROXICET) 5-325 MG tablet    Sig: Take 1 tablet by mouth every 8 (eight) hours as needed for severe pain (pain score 7-10).    Dispense:  15 tablet    Refill:  0     Discussed warning signs or symptoms. Please see discharge instructions. Patient expresses understanding.   The above documentation has been reviewed and is accurate and complete Clementeen Graham, M.D.

## 2023-08-14 NOTE — Patient Instructions (Addendum)
 Thank you for coming in today.   You should hear from MRI scheduling within 1 week. If you do not hear please let me know.    I've sent a prescription for oxycodone to your pharmacy.   Let's see what these MRI's say and plan from there

## 2023-08-17 ENCOUNTER — Encounter: Payer: 59 | Admitting: Physical Therapy

## 2023-08-22 ENCOUNTER — Encounter: Payer: Self-pay | Admitting: Physical Therapy

## 2023-08-22 ENCOUNTER — Ambulatory Visit (INDEPENDENT_AMBULATORY_CARE_PROVIDER_SITE_OTHER): Payer: 59 | Admitting: Physical Therapy

## 2023-08-22 DIAGNOSIS — M5459 Other low back pain: Secondary | ICD-10-CM | POA: Diagnosis not present

## 2023-08-22 DIAGNOSIS — M542 Cervicalgia: Secondary | ICD-10-CM

## 2023-08-22 DIAGNOSIS — M6281 Muscle weakness (generalized): Secondary | ICD-10-CM

## 2023-08-22 NOTE — Therapy (Signed)
 OUTPATIENT PHYSICAL THERAPY THORACOLUMBAR TREATMENT   Patient Name: Suzanne Gutierrez MRN: [id number] DOB:20-Sep-1965, 58 y.o., female Today's Date: 08/22/2023  END OF SESSION:  PT End of Session - 08/22/23 1304     Visit Number 4    Number of Visits 16    Date for PT Re-Evaluation 10/18/23    Authorization Type [id number]    Authorization - Visit Number 4    Authorization - Number of Visits 40    Progress Note Due on Visit 10    PT Start Time 1305    PT Stop Time 1344    PT Time Calculation (min) 39 min    Behavior During Therapy WFL for tasks assessed/performed              Past Medical History:  Diagnosis Date   Bipolar disorder (HCC)    patient denies; reports only having post partum depression 21 years ago    Elevated temperature    at PAT appt 99.3 ; denies fevers at home nor cold/flu sx; reports hot flahses due to menopause    GERD (gastroesophageal reflux disease)    Headache    Migraine   High cholesterol    per patient report    Hypertension    Lichen sclerosus of female genitalia    Morbid obesity with BMI of 40.0-44.9, adult (HCC)    Has been in the process of evaluation for GOP   Osteoarthritis    PONV (postoperative nausea and vomiting)    Past Surgical History:  Procedure Laterality Date   APPENDECTOMY  1994   BIOPSY  07/13/2018   Procedure: BIOPSY;  Surgeon: Ovidio Kin, MD;  Location: [facility];  Service: General;;   breast lift Bilateral 05/2019   CARDIAC CATHETERIZATION  2016   [facility] --was told she had no significant disease.   CARDIAC CATHETERIZATION  2019   reports she had this done at [facility] at the same time as ECHO    CESAREAN SECTION     ESOPHAGOGASTRODUODENOSCOPY (EGD) WITH PROPOFOL N/A 07/13/2018   Procedure: ESOPHAGOGASTRODUODENOSCOPY (EGD) WITH PROPOFOL;  Surgeon: Ovidio Kin, MD;  Location: [facility];  Service: General;  Laterality: N/A;   GASTRIC ROUX-EN-Y N/A 12/25/2017   Procedure: LAPAROSCOPIC  ROUX-EN-Y GASTRIC BYPASS WITH UPPER ENDOSCOPY, ERAS PATHWAY;  Surgeon: Glenna Fellows, MD;  Location: [facility];  Service: General;  Laterality: N/A;   HERNIA REPAIR     X2   NM MYOVIEW LTD  07/27/2017    LOW risk.  EF 67%.  No reversible perfusion noted.  There is a fixed defect in the lateral wall toward the apex likely breast attenuation.   TRANSTHORACIC ECHOCARDIOGRAM  07/27/2017   No regional wall motion abnormality.  (Poor quality).  Mild RV dilation.  Mild LA dilation.   TUBAL LIGATION     WISDOM TOOTH EXTRACTION     Patient Active Problem List   Diagnosis Date Noted   Age-related osteoporosis without current pathological fracture 04/17/2023   Arthropathy of lumbar facet joint 11/16/2020   Advanced osteoarthritis of spine 10/13/2020   Acute left-sided low back pain without sciatica 10/05/2020   Body mass index (BMI) 27.0-27.9, adult 10/05/2020   Elevated blood-pressure reading, without diagnosis of hypertension 10/05/2020   Numbness of hand 04/16/2020   Lichen sclerosus 09/13/2019   Digital mucous cyst 08/27/2019   Adhesive capsulitis of right shoulder 09/25/2018   Impingement syndrome of right shoulder region 08/14/2018   Migraine 03/27/2018   Morbid obesity with BMI  of 40.0-44.9, adult (HCC) 12/25/2017   Costochondritis 08/08/2017   Diabetes mellitus without complication (HCC) 07/26/2017   Essential hypertension 07/24/2017   Left-sided chest wall pain 07/21/2017   DOE (dyspnea on exertion) 07/21/2017   Chest pain 07/21/2017   Abnormal finding on EKG 07/21/2017   Hemorrhoids 06/07/2017   Seasonal allergies 04/09/2011    PCP: Clovis Riley, L.August Saucer, MD  REFERRING PROVIDER: Rodolph Bong, MD  REFERRING DIAG: [phone] (ICD-10-CM) - Chronic bilateral low back pain without sciatica M54.2 (ICD-10-CM) - Neck pain  Rationale for Evaluation and Treatment: Rehabilitation  THERAPY DIAG:  Cervicalgia  Other low back pain  Muscle weakness (generalized)  ONSET DATE: neck  pain - few months, back pain chronic  SUBJECTIVE:                                                                                                                                                                                           SUBJECTIVE STATEMENT: 08/22/2023 States she can't lay on her left side, she went to the MD and he did an [place] and ordered an MRI for her left arm. States the back is OK but she is no longer thinking about her back due to her left shoulder bothering her so much.   E VAL: States that she has had chronic low back pain and had a lumbar fusion due to arthritis in her spine. States she has a lot of pain in her spine. States that the xray showed arthritis that is causing a lot of pain. States she is seeing a trainer who use to do PT .States the MD wants her to just go to PT for now.   States she doesn't mind coming to PT if it helps.   States she has a new diagnosis of osteoporosis. States she has started on injections.   States she is weak in her legs but doesn't have pain in her legs. States she has trouble getting up at times. States she sits a lot during the day.  States she has a yoga ball and stretches into flexion. States she has back spasms and stiffens up.   States her neck hurts all the time and that is new over the last couple months. States no UE symptoms.   Pt locates pain to bilateral neck, burning pain. Bilateral lower back pain flaring up since the weekend. Notes difficulty with mobility d/t pain. Has been working with person trainer, at times, unable to push through the pain in the gym.   Left arm has pain in upper arm at times    PERTINENT HISTORY:  Diabetes and osteoporosis.  History of bariatric surgery.  Tummy tuck History of lumbar spine fusion L5-S1 - a couple years ago    PAIN:  Are you having pain? Yes: NPRS scale: 2/10   Pain location: left shoulder  Pain description: throbbing, intense Aggravating factors: unsure Relieving factors:  unsure   Neck pain is 5/10  Description: hurts, achy on both sides, tender, burning, Alleviating: nothing Aggravating: sitting  PRECAUTIONS: None  RED FLAGS: None   WEIGHT BEARING RESTRICTIONS: No  FALLS:  Has patient fallen in last 6 months? 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  DIAGNOSTIC FINDINGS:  Xray 07/15/23  Low back    IMPRESSION: 1. Posterior left L5-S1 fusion. 2. Multilevel degenerative disc disease most pronounced at L5-S1.  Cervical spine    IMPRESSION: Degenerative disc disease at C5-6.  PATIENT SURVEYS:  Patient-specific activity functional scoring scheme (Point to one number):  "0" represents "unable to perform." "10" represents "able to perform at prior level. 0 1 2 3 4 5 6 7 8 9  10 (Date and Score) Activity Initial  Activity Eval     Getting up from supine  8    Laying on stomach 0     Lifting anything with left arm 5   Opening jars with left hand 0     Additional Total score = sum of the activity scores/number of activities Minimum detectable change (90%CI) for average score = 2 points Minimum detectable change (90%CI) for single activity score = 3 points PSFS developed by: Jake Seats., & Binkley, J. (1995). Assessing disability and change on individual  patients: a report of a patient specific measure. Physiotherapy [place], [phone], [phone]. Reproduced with the permission of the authors  Score: 13/40   COGNITION: Overall cognitive status: Within functional limits for tasks assessed     SENSATION: Not tested    POSTURE: rounded shoulders, forward head, decreased lumbar lordosis, increased thoracic kyphosis, posterior pelvic tilt, flexed trunk , and weight shift right  PALPATION: Tenderness to palpation along left glutes  and lumbar spine, hypomobility noted with spring testing in lumbar spine    LUMBAR ROM:   AROM eval  Flexion 75% limited  Extension 100% limited*  Right lateral flexion 75% limited*  Left lateral flexion 50% limited  Right rotation   Left rotation    (Blank rows = not tested)  * pain   LE Measurements Lower Extremity Right EVAL Left EVAL   A/PROM MMT A/PROM MMT  Hip Flexion WFL 3+ WFL 3  Hip Extension WFL 3 WFL 3  Hip Abduction      Hip Adduction      Hip Internal rotation [facility]   Hip External rotation [facility]  WFL   Knee Flexion  3+  3+  Knee Extension  3+  3+  Ankle Dorsiflexion  4  4  Ankle Plantarflexion      Ankle Inversion      Ankle Eversion       (Blank rows = not tested) * pain   LUMBAR SPECIAL TESTS:  Slump test: Negative Breathing assessment: Limited diaphragmatic excursion primarily anterior chest breather ribs excessively splayed    TREATMENT DATE:  08/22/2023  Therapeutic Exercise:   Supine:DKC  2 minutes, LTR 2 minutes, SAQs on ball 2 minutes, piriformis stretch 1.5 minutes on each leg B.  Prone:   Seated:educated in safe use of traction device (starting with < 15 degrees inversion, and less than 1 minute bouts on and one minute bouts off) discussed never performing complete inversion.   Standing: Neuromuscular Re-education  Manual Therapy: lumbar traction on ball 12 minutes  Therapeutic Activity: Self Care:   Trigger Point Dry Needling:  Modalities: thermotherapy to low back during supine interventions    PATIENT EDUCATION:  Education details: on  HEP, on safe use of inversion table at home, on benefits of gentle transition to prone with shorter holds Person educated: Patient Education method: Explanation, Demonstration, and Handouts Education comprehension: verbalized understanding   HOME EXERCISE  PROGRAM: [id number]  ASSESSMENT:  CLINICAL IMPRESSION: 08/22/2023 Focused on review of entire HEP and answering all questions. Discussed plan moving forward and to continue to work on low back pain with limited shoulder motion at this time. Overall patient tolerated session well with reduced pain afterwards. Added DKC on ball to HEP Will continue with current POC as tolerated.    EVAL: Patient presents to physical therapy with complaints of chronic low back pain with history of bariatric surgery, lumbar fusion and tummy tuck that is likely contributing to current presentation.  Patient with recent diagnosis of osteoporosis and recent increase in neck pain as well.  Patient presents limitations in posture, range of motion and strength that is likely contributing to current presentation.  Session focused on education as well as development of home exercise program.  Patient would greatly benefit from skilled PT to improve overall function and quality of life.  OBJECTIVE IMPAIRMENTS: decreased activity tolerance, decreased mobility, decreased ROM, decreased strength, improper body mechanics, postural dysfunction, and pain.   ACTIVITY LIMITATIONS: lifting, bending, standing, transfers, bed mobility, reach over head, and locomotion level  PARTICIPATION LIMITATIONS: cleaning, driving, and community activity  PERSONAL FACTORS: Age, Fitness, Time since onset of injury/illness/exacerbation, and 3+ comorbidities: lumbar fusion, tummy tuck, neck pain  are also affecting patient's functional outcome.   REHAB POTENTIAL: Good  CLINICAL DECISION MAKING: Evolving/moderate complexity  EVALUATION COMPLEXITY: Moderate   GOALS: Goals reviewed with patient? yes  SHORT TERM GOALS: Target date: 09/06/2023   Patient will be independent in self management strategies to improve quality of life and functional outcomes. Baseline: New Program Goal status: INITIAL  2.  Patient will report at least 50% improvement in  overall symptoms and/or function to demonstrate improved functional mobility Baseline: 0% better Goal status: INITIAL  3.  Patient will demonstrate pain-free lumbar range of motion in all directions Baseline: Painful Goal status: INITIAL  4.  Patient will report improved ergonomic set up at work to reduce stress on neck and back Baseline: None currently Goal status: INITIAL    LONG TERM GOALS: Target date: 10/18/2023    Patient will report at least 75% improvement in overall symptoms and/or function to demonstrate improved functional mobility Baseline: 0% better Goal status: INITIAL  2.  Patient will score at least 2 points higher on PSFS average to demonstrate change in overall function. Baseline: see above Goal status: INITIAL  3.  Patient will be able to demonstrate at least a 6-second exhale to demonstrate improved diaphragmatic excursion and core activation Baseline: Unable Goal status: INITIAL  4.  Patient will demonstrate at least 4 out of 5 manual muscle testing in bilateral lower extremities Baseline: See above  Goal status: INITIAL   PLAN:  PT FREQUENCY: 1-2x/week  PT DURATION: 12 weeks  PLANNED INTERVENTIONS: 97110-Therapeutic exercises, 97530- Therapeutic activity, 97112- Neuromuscular re-education, [phone]- Self Care, 60454- Manual therapy, [phone]- Gait training, [phone]- Orthotic Fit/training, [phone]- Canalith repositioning, [id number]- Aquatic Therapy, [id number]- Electrical stimulation (unattended), [phone]- Ionotophoresis 4mg /ml Dexamethasone, Patient/Family education, Balance training, Stair training, Taping, Dry Needling, Joint mobilization, Joint manipulation, Spinal manipulation, Spinal mobilization, Cryotherapy, and Moist heat   PLAN FOR NEXT SESSION: PSFS, posture/ergonomic position, breath work, hip and core strengthening, assess neck   Progress to prone as able - slow and controlled    1:56 PM, 08/22/23 Tereasa Coop, DPT Physical Therapy with [facility]

## 2023-08-24 ENCOUNTER — Ambulatory Visit (INDEPENDENT_AMBULATORY_CARE_PROVIDER_SITE_OTHER): Payer: 59 | Admitting: Physical Therapy

## 2023-08-24 ENCOUNTER — Encounter: Payer: Self-pay | Admitting: Physical Therapy

## 2023-08-24 DIAGNOSIS — M6281 Muscle weakness (generalized): Secondary | ICD-10-CM

## 2023-08-24 DIAGNOSIS — M5459 Other low back pain: Secondary | ICD-10-CM

## 2023-08-24 DIAGNOSIS — M542 Cervicalgia: Secondary | ICD-10-CM | POA: Diagnosis not present

## 2023-08-24 NOTE — Therapy (Signed)
 OUTPATIENT PHYSICAL THERAPY THORACOLUMBAR TREATMENT   Patient Name: Suzanne Gutierrez MRN: [id number] DOB:1965-12-17, 58 y.o., female Today's Date: 08/24/2023  END OF SESSION:  PT End of Session - 08/24/23 0813     Visit Number 5    Number of Visits 16    Date for PT Re-Evaluation 10/18/23    Authorization Type [id number]    Authorization - Visit Number 5    Authorization - Number of Visits 40    Progress Note Due on Visit 10    PT Start Time 0814   late to checkin   PT Stop Time 0840    PT Time Calculation (min) 26 min    Behavior During Therapy WFL for tasks assessed/performed              Past Medical History:  Diagnosis Date   Bipolar disorder (HCC)    patient denies; reports only having post partum depression 21 years ago    Elevated temperature    at PAT appt 99.3 ; denies fevers at home nor cold/flu sx; reports hot flahses due to menopause    GERD (gastroesophageal reflux disease)    Headache    Migraine   High cholesterol    per patient report    Hypertension    Lichen sclerosus of female genitalia    Morbid obesity with BMI of 40.0-44.9, adult (HCC)    Has been in the process of evaluation for GOP   Osteoarthritis    PONV (postoperative nausea and vomiting)    Past Surgical History:  Procedure Laterality Date   APPENDECTOMY  1994   BIOPSY  07/13/2018   Procedure: BIOPSY;  Surgeon: Ovidio Kin, MD;  Location: [facility];  Service: General;;   breast lift Bilateral 05/2019   CARDIAC CATHETERIZATION  2016   [facility] --was told she had no significant disease.   CARDIAC CATHETERIZATION  2019   reports she had this done at [facility] at the same time as ECHO    CESAREAN SECTION     ESOPHAGOGASTRODUODENOSCOPY (EGD) WITH PROPOFOL N/A 07/13/2018   Procedure: ESOPHAGOGASTRODUODENOSCOPY (EGD) WITH PROPOFOL;  Surgeon: Ovidio Kin, MD;  Location: [facility];  Service: General;  Laterality: N/A;   GASTRIC ROUX-EN-Y N/A 12/25/2017   Procedure:  LAPAROSCOPIC ROUX-EN-Y GASTRIC BYPASS WITH UPPER ENDOSCOPY, ERAS PATHWAY;  Surgeon: Glenna Fellows, MD;  Location: [facility];  Service: General;  Laterality: N/A;   HERNIA REPAIR     X2   NM MYOVIEW LTD  07/27/2017    LOW risk.  EF 67%.  No reversible perfusion noted.  There is a fixed defect in the lateral wall toward the apex likely breast attenuation.   TRANSTHORACIC ECHOCARDIOGRAM  07/27/2017   No regional wall motion abnormality.  (Poor quality).  Mild RV dilation.  Mild LA dilation.   TUBAL LIGATION     WISDOM TOOTH EXTRACTION     Patient Active Problem List   Diagnosis Date Noted   Age-related osteoporosis without current pathological fracture 04/17/2023   Arthropathy of lumbar facet joint 11/16/2020   Advanced osteoarthritis of spine 10/13/2020   Acute left-sided low back pain without sciatica 10/05/2020   Body mass index (BMI) 27.0-27.9, adult 10/05/2020   Elevated blood-pressure reading, without diagnosis of hypertension 10/05/2020   Numbness of hand 04/16/2020   Lichen sclerosus 09/13/2019   Digital mucous cyst 08/27/2019   Adhesive capsulitis of right shoulder 09/25/2018   Impingement syndrome of right shoulder region 08/14/2018   Migraine 03/27/2018  Morbid obesity with BMI of 40.0-44.9, adult (HCC) 12/25/2017   Costochondritis 08/08/2017   Diabetes mellitus without complication (HCC) 07/26/2017   Essential hypertension 07/24/2017   Left-sided chest wall pain 07/21/2017   DOE (dyspnea on exertion) 07/21/2017   Chest pain 07/21/2017   Abnormal finding on EKG 07/21/2017   Hemorrhoids 06/07/2017   Seasonal allergies 04/09/2011    PCP: Clovis Riley, L.August Saucer, MD  REFERRING PROVIDER: Rodolph Bong, MD  REFERRING DIAG: [phone] (ICD-10-CM) - Chronic bilateral low back pain without sciatica M54.2 (ICD-10-CM) - Neck pain  Rationale for Evaluation and Treatment: Rehabilitation  THERAPY DIAG:  Cervicalgia  Muscle weakness (generalized)  Other low back  pain  ONSET DATE: neck pain - few months, back pain chronic  SUBJECTIVE:                                                                                                                                                                                           SUBJECTIVE STATEMENT: 08/24/2023 States her back was feeling good after last session. States she tried her exercises, No current back pain but her shoulder pain is killing her.   E VAL: States that she has had chronic low back pain and had a lumbar fusion due to arthritis in her spine. States she has a lot of pain in her spine. States that the xray showed arthritis that is causing a lot of pain. States she is seeing a trainer who use to do PT .States the MD wants her to just go to PT for now.   States she doesn't mind coming to PT if it helps.   States she has a new diagnosis of osteoporosis. States she has started on injections.   States she is weak in her legs but doesn't have pain in her legs. States she has trouble getting up at times. States she sits a lot during the day.  States she has a yoga ball and stretches into flexion. States she has back spasms and stiffens up.   States her neck hurts all the time and that is new over the last couple months. States no UE symptoms.   Pt locates pain to bilateral neck, burning pain. Bilateral lower back pain flaring up since the weekend. Notes difficulty with mobility d/t pain. Has been working with person trainer, at times, unable to push through the pain in the gym.   Left arm has pain in upper arm at times    PERTINENT HISTORY:  Diabetes and osteoporosis.  History of bariatric surgery. Tummy tuck History of lumbar spine fusion L5-S1 - a couple years ago    PAIN:  Are you  having pain? Yes: NPRS scale: 4/10   Pain location: left shoulder  Pain description: throbbing, intense Aggravating factors: unsure Relieving factors: unsure   Neck pain is 5/10  Description: hurts, achy on  both sides, tender, burning, Alleviating: nothing Aggravating: sitting  PRECAUTIONS: None  RED FLAGS: None   WEIGHT BEARING RESTRICTIONS: No  FALLS:  Has patient fallen in last 6 months? 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  DIAGNOSTIC FINDINGS:  Xray 07/15/23  Low back    IMPRESSION: 1. Posterior left L5-S1 fusion. 2. Multilevel degenerative disc disease most pronounced at L5-S1.  Cervical spine    IMPRESSION: Degenerative disc disease at C5-6.  PATIENT SURVEYS:  Patient-specific activity functional scoring scheme (Point to one number):  "0" represents "unable to perform." "10" represents "able to perform at prior level. 0 1 2 3 4 5 6 7 8 9  10 (Date and Score) Activity Initial  Activity Eval     Getting up from supine  8    Laying on stomach 0     Lifting anything with left arm 5   Opening jars with left hand 0     Additional Total score = sum of the activity scores/number of activities Minimum detectable change (90%CI) for average score = 2 points Minimum detectable change (90%CI) for single activity score = 3 points PSFS developed by: Jake Seats., & Binkley, J. (1995). Assessing disability and change on individual  patients: a report of a patient specific measure. Physiotherapy [place], 47, [phone]. Reproduced with the permission of the authors  Score: 13/40   COGNITION: Overall cognitive status: Within functional limits for tasks assessed     SENSATION: Not tested    POSTURE: rounded shoulders, forward head, decreased lumbar lordosis, increased thoracic kyphosis, posterior pelvic tilt, flexed trunk , and weight shift right  PALPATION: Tenderness to palpation along left glutes and lumbar spine, hypomobility noted with spring testing in  lumbar spine    LUMBAR ROM:   AROM eval  Flexion 75% limited  Extension 100% limited*  Right lateral flexion 75% limited*  Left lateral flexion 50% limited  Right rotation   Left rotation    (Blank rows = not tested)  * pain   LE Measurements Lower Extremity Right EVAL Left EVAL   A/PROM MMT A/PROM MMT  Hip Flexion WFL 3+ WFL 3  Hip Extension WFL 3 WFL 3  Hip Abduction      Hip Adduction      Hip Internal rotation [facility]   Hip External rotation [facility]  WFL   Knee Flexion  3+  3+  Knee Extension  3+  3+  Ankle Dorsiflexion  4  4  Ankle Plantarflexion      Ankle Inversion      Ankle Eversion       (Blank rows = not tested) * pain   LUMBAR SPECIAL TESTS:  Slump test: Negative Breathing assessment: Limited diaphragmatic excursion primarily anterior chest breather ribs excessively splayed    TREATMENT DATE:  08/24/2023  Therapeutic Exercise:   Supine:90/90 relief position with breath work 3 minutes, DKC  2 minutes, LTR 2 minutes, SAQs on ball 2 minutes, piriformis stretch 1.5 minutes on each leg B.  Review of HEP Prone:   Seated   Standing: Neuromuscular Re-education  Manual Therapy: lumbar traction on ball 10 minutes  Therapeutic Activity: Self Care:   Trigger Point Dry Needling:  Modalities: thermotherapy to low back and left shoulder during supine interventions    PATIENT EDUCATION:  Education details: on  HEP  Person educated: Patient Education method: Programmer, multimedia, Facilities manager, and Handouts Education comprehension: verbalized understanding   HOME EXERCISE PROGRAM: [id number]  ASSESSMENT:  CLINICAL IMPRESSION: 08/24/2023 Session limited secondary to late arrival. Focused on lumbar mobility and pain management strategies. Tolerated well with reduced tension noted end of session. Tolerated session well, will continue with  current POC as tolerated.    EVAL: Patient presents to physical therapy with complaints of chronic low back pain with history of bariatric surgery, lumbar fusion and tummy tuck that is likely contributing to current presentation.  Patient with recent diagnosis of osteoporosis and recent increase in neck pain as well.  Patient presents limitations in posture, range of motion and strength that is likely contributing to current presentation.  Session focused on education as well as development of home exercise program.  Patient would greatly benefit from skilled PT to improve overall function and quality of life.  OBJECTIVE IMPAIRMENTS: decreased activity tolerance, decreased mobility, decreased ROM, decreased strength, improper body mechanics, postural dysfunction, and pain.   ACTIVITY LIMITATIONS: lifting, bending, standing, transfers, bed mobility, reach over head, and locomotion level  PARTICIPATION LIMITATIONS: cleaning, driving, and community activity  PERSONAL FACTORS: Age, Fitness, Time since onset of injury/illness/exacerbation, and 3+ comorbidities: lumbar fusion, tummy tuck, neck pain  are also affecting patient's functional outcome.   REHAB POTENTIAL: Good  CLINICAL DECISION MAKING: Evolving/moderate complexity  EVALUATION COMPLEXITY: Moderate   GOALS: Goals reviewed with patient? yes  SHORT TERM GOALS: Target date: 09/06/2023   Patient will be independent in self management strategies to improve quality of life and functional outcomes. Baseline: New Program Goal status: INITIAL  2.  Patient will report at least 50% improvement in overall symptoms and/or function to demonstrate improved functional mobility Baseline: 0% better Goal status: INITIAL  3.  Patient will demonstrate pain-free lumbar range of motion in all directions Baseline: Painful Goal status: INITIAL  4.  Patient will report improved ergonomic set up at work to reduce stress on neck and back Baseline: None  currently Goal status: INITIAL    LONG TERM GOALS: Target date: 10/18/2023    Patient will report at least 75% improvement in overall symptoms and/or function to demonstrate improved functional mobility Baseline: 0% better Goal status: INITIAL  2.  Patient will score at least 2 points higher on PSFS average to demonstrate change in overall function. Baseline: see above Goal status: INITIAL  3.  Patient will be able to demonstrate at least a 6-second exhale to demonstrate improved diaphragmatic excursion and core activation Baseline: Unable Goal status: INITIAL  4.  Patient will demonstrate at least 4 out of 5 manual muscle testing in bilateral lower extremities Baseline: See above Goal status: INITIAL   PLAN:  PT FREQUENCY: 1-2x/week  PT DURATION: 12 weeks  PLANNED INTERVENTIONS: 97110-Therapeutic exercises, 97530- Therapeutic activity, [id number]- Neuromuscular re-education, 97535- Self Care, [id number]- Manual therapy, [id number]- Gait training, [phone]- Orthotic Fit/training, [phone]- Canalith repositioning, [id number]- Aquatic Therapy, [id number]- Electrical stimulation (unattended), [id number]- Ionotophoresis 4mg /ml  Dexamethasone, Patient/Family education, Balance training, Stair training, Taping, Dry Needling, Joint mobilization, Joint manipulation, Spinal manipulation, Spinal mobilization, Cryotherapy, and Moist heat   PLAN FOR NEXT SESSION: PSFS, posture/ergonomic position, breath work, hip and core strengthening, assess neck   Progress to prone as able - slow and controlled    8:42 AM, 08/24/23 Tereasa Coop, DPT Physical Therapy with [facility]

## 2023-08-25 ENCOUNTER — Encounter: Payer: Self-pay | Admitting: Family Medicine

## 2023-08-26 ENCOUNTER — Ambulatory Visit
Admission: RE | Admit: 2023-08-26 | Discharge: 2023-08-26 | Disposition: A | Discharge status: Home / Self Care | Source: Ambulatory Visit | Attending: Family Medicine | Admitting: Family Medicine

## 2023-08-26 DIAGNOSIS — M542 Cervicalgia: Secondary | ICD-10-CM

## 2023-08-26 DIAGNOSIS — G8929 Other chronic pain: Secondary | ICD-10-CM

## 2023-08-28 ENCOUNTER — Other Ambulatory Visit: Payer: Self-pay | Admitting: Family Medicine

## 2023-08-28 NOTE — Telephone Encounter (Signed)
 Forwarding to Dr. Denyse Amass.

## 2023-08-29 ENCOUNTER — Encounter: Payer: Self-pay | Admitting: Physical Therapy

## 2023-08-29 ENCOUNTER — Ambulatory Visit (INDEPENDENT_AMBULATORY_CARE_PROVIDER_SITE_OTHER): Payer: 59 | Admitting: Physical Therapy

## 2023-08-29 DIAGNOSIS — M5459 Other low back pain: Secondary | ICD-10-CM | POA: Diagnosis not present

## 2023-08-29 DIAGNOSIS — M6281 Muscle weakness (generalized): Secondary | ICD-10-CM

## 2023-08-29 DIAGNOSIS — M542 Cervicalgia: Secondary | ICD-10-CM

## 2023-08-29 NOTE — Therapy (Signed)
 OUTPATIENT PHYSICAL THERAPY THORACOLUMBAR TREATMENT   Patient Name: Suzanne Gutierrez MRN: [id number] DOB:11-Mar-1966, 58 y.o., female Today's Date: 08/29/2023  END OF SESSION:  PT End of Session - 08/29/23 1350     Visit Number 6    Number of Visits 16    Date for PT Re-Evaluation 10/18/23    Authorization Type [id number]    Authorization - Visit Number 6    Authorization - Number of Visits 40    Progress Note Due on Visit 10    PT Start Time 1351    PT Stop Time 1429    PT Time Calculation (min) 38 min    Behavior During Therapy WFL for tasks assessed/performed              Past Medical History:  Diagnosis Date   Bipolar disorder (HCC)    patient denies; reports only having post partum depression 21 years ago    Elevated temperature    at PAT appt 99.3 ; denies fevers at home nor cold/flu sx; reports hot flahses due to menopause    GERD (gastroesophageal reflux disease)    Headache    Migraine   High cholesterol    per patient report    Hypertension    Lichen sclerosus of female genitalia    Morbid obesity with BMI of 40.0-44.9, adult (HCC)    Has been in the process of evaluation for GOP   Osteoarthritis    PONV (postoperative nausea and vomiting)    Past Surgical History:  Procedure Laterality Date   APPENDECTOMY  1994   BIOPSY  07/13/2018   Procedure: BIOPSY;  Surgeon: Ovidio Kin, MD;  Location: [facility];  Service: General;;   breast lift Bilateral 05/2019   CARDIAC CATHETERIZATION  2016   [facility] --was told she had no significant disease.   CARDIAC CATHETERIZATION  2019   reports she had this done at [facility] at the same time as ECHO    CESAREAN SECTION     ESOPHAGOGASTRODUODENOSCOPY (EGD) WITH PROPOFOL N/A 07/13/2018   Procedure: ESOPHAGOGASTRODUODENOSCOPY (EGD) WITH PROPOFOL;  Surgeon: Ovidio Kin, MD;  Location: [facility];  Service: General;  Laterality: N/A;   GASTRIC ROUX-EN-Y N/A 12/25/2017   Procedure: LAPAROSCOPIC  ROUX-EN-Y GASTRIC BYPASS WITH UPPER ENDOSCOPY, ERAS PATHWAY;  Surgeon: Glenna Fellows, MD;  Location: [facility];  Service: General;  Laterality: N/A;   HERNIA REPAIR     X2   NM MYOVIEW LTD  07/27/2017    LOW risk.  EF 67%.  No reversible perfusion noted.  There is a fixed defect in the lateral wall toward the apex likely breast attenuation.   TRANSTHORACIC ECHOCARDIOGRAM  07/27/2017   No regional wall motion abnormality.  (Poor quality).  Mild RV dilation.  Mild LA dilation.   TUBAL LIGATION     WISDOM TOOTH EXTRACTION     Patient Active Problem List   Diagnosis Date Noted   Age-related osteoporosis without current pathological fracture 04/17/2023   Arthropathy of lumbar facet joint 11/16/2020   Advanced osteoarthritis of spine 10/13/2020   Acute left-sided low back pain without sciatica 10/05/2020   Body mass index (BMI) 27.0-27.9, adult 10/05/2020   Elevated blood-pressure reading, without diagnosis of hypertension 10/05/2020   Numbness of hand 04/16/2020   Lichen sclerosus 09/13/2019   Digital mucous cyst 08/27/2019   Adhesive capsulitis of right shoulder 09/25/2018   Impingement syndrome of right shoulder region 08/14/2018   Migraine 03/27/2018   Morbid obesity with BMI  of 40.0-44.9, adult (HCC) 12/25/2017   Costochondritis 08/08/2017   Diabetes mellitus without complication (HCC) 07/26/2017   Essential hypertension 07/24/2017   Left-sided chest wall pain 07/21/2017   DOE (dyspnea on exertion) 07/21/2017   Chest pain 07/21/2017   Abnormal finding on EKG 07/21/2017   Hemorrhoids 06/07/2017   Seasonal allergies 04/09/2011    PCP: Clovis Riley, L.August Saucer, MD  REFERRING PROVIDER: Rodolph Bong, MD  REFERRING DIAG: [id number] (ICD-10-CM) - Chronic bilateral low back pain without sciatica M54.2 (ICD-10-CM) - Neck pain  Rationale for Evaluation and Treatment: Rehabilitation  THERAPY DIAG:  Cervicalgia  Muscle weakness (generalized)  Other low back pain  ONSET DATE: neck  pain - few months, back pain chronic  SUBJECTIVE:                                                                                                                                                                                           SUBJECTIVE STATEMENT: 08/29/2023 States that got early results of MRI and might have pinched nerve in her neck. States she has been working a lot and is very sore in her neck and back. States she has her smaller yoga ball and tried that once. States that she has done some of the breathing exercises.   E VAL: States that she has had chronic low back pain and had a lumbar fusion due to arthritis in her spine. States she has a lot of pain in her spine. States that the xray showed arthritis that is causing a lot of pain. States she is seeing a trainer who use to do PT .States the MD wants her to just go to PT for now.   States she doesn't mind coming to PT if it helps.   States she has a new diagnosis of osteoporosis. States she has started on injections.   States she is weak in her legs but doesn't have pain in her legs. States she has trouble getting up at times. States she sits a lot during the day.  States she has a yoga ball and stretches into flexion. States she has back spasms and stiffens up.   States her neck hurts all the time and that is new over the last couple months. States no UE symptoms.   Pt locates pain to bilateral neck, burning pain. Bilateral lower back pain flaring up since the weekend. Notes difficulty with mobility d/t pain. Has been working with person trainer, at times, unable to push through the pain in the gym.   Left arm has pain in upper arm at times    PERTINENT HISTORY:  Diabetes and osteoporosis.  History  of bariatric surgery. Tummy tuck History of lumbar spine fusion L5-S1 - a couple years ago    PAIN:  Are you having pain? Yes: NPRS scale: 4/10   Pain location: left shoulder and back   Pain description: throbbing,  intense Aggravating factors: unsure Relieving factors: unsure   Neck pain is 5/10  Description: hurts, achy on both sides, tender, burning, Alleviating: nothing Aggravating: sitting  PRECAUTIONS: None  RED FLAGS: None   WEIGHT BEARING RESTRICTIONS: No  FALLS:  Has patient fallen in last 6 months? 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  DIAGNOSTIC FINDINGS:  Xray 07/15/23  Low back    IMPRESSION: 1. Posterior left L5-S1 fusion. 2. Multilevel degenerative disc disease most pronounced at L5-S1.  Cervical spine    IMPRESSION: Degenerative disc disease at C5-6.  PATIENT SURVEYS:  Patient-specific activity functional scoring scheme (Point to one number):  "0" represents "unable to perform." "10" represents "able to perform at prior level. 0 1 2 3 4 5 6 7 8 9  10 (Date and Score) Activity Initial  Activity Eval     Getting up from supine  8    Laying on stomach 0     Lifting anything with left arm 5   Opening jars with left hand 0     Additional Total score = sum of the activity scores/number of activities Minimum detectable change (90%CI) for average score = 2 points Minimum detectable change (90%CI) for single activity score = 3 points PSFS developed by: Jake Seats., & Binkley, J. (1995). Assessing disability and change on individual  patients: a report of a patient specific measure. Physiotherapy [place], [phone], [phone]. Reproduced with the permission of the authors  Score: 13/40   COGNITION: Overall cognitive status: Within functional limits for tasks assessed     SENSATION: Not tested    POSTURE: rounded shoulders, forward head, decreased lumbar lordosis, increased thoracic kyphosis, posterior pelvic tilt, flexed trunk , and weight shift  right  PALPATION: Tenderness to palpation along left glutes and lumbar spine, hypomobility noted with spring testing in lumbar spine    LUMBAR ROM:   AROM eval  Flexion 75% limited  Extension 100% limited*  Right lateral flexion 75% limited*  Left lateral flexion 50% limited  Right rotation   Left rotation    (Blank rows = not tested)  * pain   LE Measurements Lower Extremity Right EVAL Left EVAL   A/PROM MMT A/PROM MMT  Hip Flexion WFL 3+ WFL 3  Hip Extension WFL 3 WFL 3  Hip Abduction      Hip Adduction      Hip Internal rotation [facility]   Hip External rotation [facility]  WFL   Knee Flexion  3+  3+  Knee Extension  3+  3+  Ankle Dorsiflexion  4  4  Ankle Plantarflexion      Ankle Inversion      Ankle Eversion       (Blank rows = not tested) * pain   LUMBAR SPECIAL TESTS:  Slump test: Negative Breathing assessment: Limited diaphragmatic excursion primarily anterior chest breather ribs excessively splayed    TREATMENT DATE:  08/29/2023  Therapeutic Exercise:   Supine:90/90 relief position with guided relaxation 6 minutes, DKC  2 minutes, LTR 2 minutes, SAQs on ball 2 minutes,  Review of HEP- answered all questions Prone:   Seated   Standing: Neuromuscular Re-education box breathing with metronome for down regulation of CNS 5 minutes, long exhale breathing 5 minutes  Manual Therapy: lumbar traction on ball 10 minutes, IASTM  to left shoulder with percussion gun and towel for vibration muscle relaxation 5 minutes Therapeutic Activity: Self Care:   Trigger Point Dry Needling:  Modalities: thermotherapy to low back and left shoulder during supine interventions    PATIENT EDUCATION:  Education details: on  HEP  Person educated: Patient Education method: Programmer, multimedia, Facilities manager, and Handouts Education comprehension: verbalized  understanding   HOME EXERCISE PROGRAM: [id number]  ASSESSMENT:  CLINICAL IMPRESSION: 08/29/2023 Continued with thermotherapy and pain management strategies. Tolerated 90/90 position exercises well with reduced tension noted afterwards. Continue with lumbar traction as this felt goo and alleviated symptoms. Reduced general soreness noted end of session. Encouraged patient to try exercises a couple times a week to get additional benefit from them. Will continue with current POC as tolerated by patient.    EVAL: Patient presents to physical therapy with complaints of chronic low back pain with history of bariatric surgery, lumbar fusion and tummy tuck that is likely contributing to current presentation.  Patient with recent diagnosis of osteoporosis and recent increase in neck pain as well.  Patient presents limitations in posture, range of motion and strength that is likely contributing to current presentation.  Session focused on education as well as development of home exercise program.  Patient would greatly benefit from skilled PT to improve overall function and quality of life.  OBJECTIVE IMPAIRMENTS: decreased activity tolerance, decreased mobility, decreased ROM, decreased strength, improper body mechanics, postural dysfunction, and pain.   ACTIVITY LIMITATIONS: lifting, bending, standing, transfers, bed mobility, reach over head, and locomotion level  PARTICIPATION LIMITATIONS: cleaning, driving, and community activity  PERSONAL FACTORS: Age, Fitness, Time since onset of injury/illness/exacerbation, and 3+ comorbidities: lumbar fusion, tummy tuck, neck pain  are also affecting patient's functional outcome.   REHAB POTENTIAL: Good  CLINICAL DECISION MAKING: Evolving/moderate complexity  EVALUATION COMPLEXITY: Moderate   GOALS: Goals reviewed with patient? yes  SHORT TERM GOALS: Target date: 09/06/2023   Patient will be independent in self management strategies to improve quality of  life and functional outcomes. Baseline: New Program Goal status: INITIAL  2.  Patient will report at least 50% improvement in overall symptoms and/or function to demonstrate improved functional mobility Baseline: 0% better Goal status: INITIAL  3.  Patient will demonstrate pain-free lumbar range of motion in all directions Baseline: Painful Goal status: INITIAL  4.  Patient will report improved ergonomic set up at work to reduce stress on neck and back Baseline: None currently Goal status: INITIAL    LONG TERM GOALS: Target date: 10/18/2023    Patient will report at least 75% improvement in overall symptoms and/or function to demonstrate improved functional mobility Baseline: 0% better Goal status: INITIAL  2.  Patient will score at least 2 points higher on PSFS average to demonstrate change in overall function. Baseline: see above Goal status: INITIAL  3.  Patient will be able to demonstrate at least a 6-second exhale to demonstrate improved diaphragmatic excursion and core activation Baseline: Unable Goal status: INITIAL  4.  Patient will demonstrate at least 4 out of 5 manual muscle testing in bilateral lower extremities Baseline:  See above Goal status: INITIAL   PLAN:  PT FREQUENCY: 1-2x/week  PT DURATION: 12 weeks  PLANNED INTERVENTIONS: 97110-Therapeutic exercises, 97530- Therapeutic activity, 97112- Neuromuscular re-education, [phone]- Self Care, 30865- Manual therapy, [phone]- Gait training, [phone]- Orthotic Fit/training, [phone]- Canalith repositioning, [id number]- Aquatic Therapy, [id number]- Electrical stimulation (unattended), [phone]- Ionotophoresis 4mg /ml Dexamethasone, Patient/Family education, Balance training, Stair training, Taping, Dry Needling, Joint mobilization, Joint manipulation, Spinal manipulation, Spinal mobilization, Cryotherapy, and Moist heat   PLAN FOR NEXT SESSION: PSFS, posture/ergonomic position, breath work, hip and core strengthening, assess neck    Progress to prone as able - slow and controlled    2:31 PM, 08/29/23 Tereasa Coop, DPT Physical Therapy with [facility]

## 2023-08-29 NOTE — Telephone Encounter (Signed)
 Last OV 08/14/23 Next OV 11/14/23  Last refill 08/14/23 Qty #15/0

## 2023-08-30 MED ORDER — OXYCODONE-ACETAMINOPHEN 5-325 MG PO TABS: 1.0000 | ORAL_TABLET | Freq: Three times a day (TID) | ORAL | 0 refills | Status: DC | PRN

## 2023-08-31 ENCOUNTER — Ambulatory Visit (INDEPENDENT_AMBULATORY_CARE_PROVIDER_SITE_OTHER): Payer: 59 | Admitting: Physical Therapy

## 2023-08-31 ENCOUNTER — Encounter: Payer: Self-pay | Admitting: Physical Therapy

## 2023-08-31 DIAGNOSIS — M6281 Muscle weakness (generalized): Secondary | ICD-10-CM

## 2023-08-31 DIAGNOSIS — M542 Cervicalgia: Secondary | ICD-10-CM | POA: Diagnosis not present

## 2023-08-31 DIAGNOSIS — M5459 Other low back pain: Secondary | ICD-10-CM

## 2023-08-31 NOTE — Therapy (Addendum)
 PHYSICAL THERAPY DISCHARGE SUMMARY  Visits from Start of Care: 7  Current functional level related to goals / functional outcomes: Could not assess - unplanned discharge   Remaining deficits: Could not assess - unplanned discharge   Education / Equipment: Could not assess - unplanned discharge   Patient agrees to discharge. Patient goals were not met. Patient is being discharged due to not returning since the last visit.  9:00 AM, 10/30/23 Tabitha Ewings, DPT Physical Therapy with [place]   OUTPATIENT PHYSICAL THERAPY THORACOLUMBAR TREATMENT   Patient Name: KAYTELYN GLORE MRN: [id number] DOB:06/09/1965, 58 y.o., female Today's Date: 08/31/2023  END OF SESSION:  PT End of Session - 08/31/23 0805     Visit Number 7    Number of Visits 16    Date for PT Re-Evaluation 10/18/23    Authorization Type [id number]    Authorization - Visit Number 7    Authorization - Number of Visits 40    Progress Note Due on Visit 10    PT Start Time 0805   pt late to checkin   PT Stop Time 0843    PT Time Calculation (min) 38 min    Behavior During Therapy [facility] for tasks assessed/performed              Past Medical History:  Diagnosis Date   Bipolar disorder (HCC)    patient denies; reports only having post partum depression 21 years ago    Elevated temperature    at PAT appt 99.3 ; denies fevers at home nor cold/flu sx; reports hot flahses due to menopause    GERD (gastroesophageal reflux disease)    Headache    Migraine   High cholesterol    per patient report    Hypertension    Lichen sclerosus of female genitalia    Morbid obesity with BMI of 40.0-44.9, adult (HCC)    Has been in the process of evaluation for GOP   Osteoarthritis    PONV (postoperative nausea and vomiting)    Past Surgical History:  Procedure Laterality Date   APPENDECTOMY  1994   BIOPSY  07/13/2018   Procedure: BIOPSY;  Surgeon: Juanita Norlander, MD;  Location: [facility];  Service: General;;   breast  lift Bilateral 05/2019   CARDIAC CATHETERIZATION  2016   [facility] --was told she had no significant disease.   CARDIAC CATHETERIZATION  2019   reports she had this done at [facility] at the same time as ECHO    CESAREAN SECTION     ESOPHAGOGASTRODUODENOSCOPY (EGD) WITH PROPOFOL  N/A 07/13/2018   Procedure: ESOPHAGOGASTRODUODENOSCOPY (EGD) WITH PROPOFOL ;  Surgeon: Juanita Norlander, MD;  Location: [facility];  Service: General;  Laterality: N/A;   GASTRIC ROUX-EN-Y N/A 12/25/2017   Procedure: LAPAROSCOPIC ROUX-EN-Y GASTRIC BYPASS WITH UPPER ENDOSCOPY, ERAS PATHWAY;  Surgeon: Ayesha Lente, MD;  Location: [facility];  Service: General;  Laterality: N/A;   HERNIA REPAIR     X2   NM MYOVIEW  LTD  07/27/2017    LOW risk.  EF 67%.  No reversible perfusion noted.  There is a fixed defect in the lateral wall toward the apex likely breast attenuation.   TRANSTHORACIC ECHOCARDIOGRAM  07/27/2017   No regional wall motion abnormality.  (Poor quality).  Mild RV dilation.  Mild LA dilation.   TUBAL LIGATION     WISDOM TOOTH EXTRACTION     Patient Active Problem List   Diagnosis Date Noted   Age-related osteoporosis without current pathological fracture  04/17/2023   Arthropathy of lumbar facet joint 11/16/2020   Advanced osteoarthritis of spine 10/13/2020   Acute left-sided low back pain without sciatica 10/05/2020   Body mass index (BMI) 27.0-27.9, adult 10/05/2020   Elevated blood-pressure reading, without diagnosis of hypertension 10/05/2020   Numbness of hand 04/16/2020   Lichen sclerosus 09/13/2019   Digital mucous cyst 08/27/2019   Adhesive capsulitis of right shoulder 09/25/2018   Impingement syndrome of right shoulder region 08/14/2018   Migraine 03/27/2018   Morbid obesity with BMI of 40.0-44.9, adult (HCC) 12/25/2017   Costochondritis 08/08/2017   Diabetes mellitus without complication (HCC) 07/26/2017   Essential hypertension 07/24/2017   Left-sided chest wall pain  07/21/2017   DOE (dyspnea on exertion) 07/21/2017   Chest pain 07/21/2017   Abnormal finding on EKG 07/21/2017   Hemorrhoids 06/07/2017   Seasonal allergies 04/09/2011    PCP: Brenita Callow, L.Rozelle Corning, MD  REFERRING PROVIDER: Syliva Even, MD  REFERRING DIAG: [phone] (ICD-10-CM) - Chronic bilateral low back pain without sciatica M54.2 (ICD-10-CM) - Neck pain  Rationale for Evaluation and Treatment: Rehabilitation  THERAPY DIAG:  Cervicalgia  Muscle weakness (generalized)  Other low back pain  ONSET DATE: neck pain - few months, back pain chronic  SUBJECTIVE:                                                                                                                                                                                           SUBJECTIVE STATEMENT: 08/31/2023 States that her shoulder is constantly in pain. States that she has not tried the inversion table but will try this weekend.   E VAL: States that she has had chronic low back pain and had a lumbar fusion due to arthritis in her spine. States she has a lot of pain in her spine. States that the xray showed arthritis that is causing a lot of pain. States she is seeing a trainer who use to do PT .States the MD wants her to just go to PT for now.   States she doesn't mind coming to PT if it helps.   States she has a new diagnosis of osteoporosis. States she has started on injections.   States she is weak in her legs but doesn't have pain in her legs. States she has trouble getting up at times. States she sits a lot during the day.  States she has a yoga ball and stretches into flexion. States she has back spasms and stiffens up.   States her neck hurts all the time and that is new over the last couple months. States no UE symptoms.   Pt locates pain  to bilateral neck, burning pain. Bilateral lower back pain flaring up since the weekend. Notes difficulty with mobility d/t pain. Has been working with person  trainer, at times, unable to push through the pain in the gym.   Left arm has pain in upper arm at times    PERTINENT HISTORY:  Diabetes and osteoporosis.  History of bariatric surgery. Tummy tuck History of lumbar spine fusion L5-S1 - a couple years ago    PAIN:  Are you having pain? Yes: NPRS scale: 3/10   Pain location: left shoulder and back   Pain description: throbbing, achy, intense Aggravating factors: unsure Relieving factors: unsure   Neck pain is 5/10  Description: hurts, achy on both sides, tender, burning, Alleviating: nothing Aggravating: sitting  PRECAUTIONS: None  RED FLAGS: None   WEIGHT BEARING RESTRICTIONS: No  FALLS:  Has patient fallen in last 6 months? 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  DIAGNOSTIC FINDINGS:  Xray 07/15/23  Low back    IMPRESSION: 1. Posterior left L5-S1 fusion. 2. Multilevel degenerative disc disease most pronounced at L5-S1.  Cervical spine    IMPRESSION: Degenerative disc disease at C5-6.  PATIENT SURVEYS:  Patient-specific activity functional scoring scheme (Point to one number):  "0" represents "unable to perform." "10" represents "able to perform at prior level. 0 1 2 3 4 5 6 7 8 9  10 (Date and Score) Activity Initial  Activity Eval     Getting up from supine  8    Laying on stomach 0     Lifting anything with left arm 5   Opening jars with left hand 0     Additional Total score = sum of the activity scores/number of activities Minimum detectable change (90%CI) for average score = 2 points Minimum detectable change (90%CI) for single activity score = 3 points PSFS developed by: [place] Spitz., & Binkley, J. (1995). Assessing disability and change on individual  patients: a report of a patient  specific measure. Physiotherapy [place], [phone], [phone]. Reproduced with the permission of the authors  Score: 13/40   COGNITION: Overall cognitive status: Within functional limits for tasks assessed     SENSATION: Not tested    POSTURE: rounded shoulders, forward head, decreased lumbar lordosis, increased thoracic kyphosis, posterior pelvic tilt, flexed trunk , and weight shift right  PALPATION: Tenderness to palpation along left glutes and lumbar spine, hypomobility noted with spring testing in lumbar spine    LUMBAR ROM:   AROM eval  Flexion 75% limited  Extension 100% limited*  Right lateral flexion 75% limited*  Left lateral flexion 50% limited  Right rotation   Left rotation    (Blank rows = not tested)  * pain   LE Measurements Lower Extremity Right EVAL Left EVAL   A/PROM MMT A/PROM MMT  Hip Flexion WFL 3+ WFL 3  Hip Extension WFL 3 WFL 3  Hip Abduction      Hip Adduction      Hip Internal rotation [facility]  [facility]   Hip External rotation [facility]  [facility]   Knee Flexion  3+  3+  Knee Extension  3+  3+  Ankle Dorsiflexion  4  4  Ankle Plantarflexion      Ankle Inversion      Ankle Eversion       (  Blank rows = not tested) * pain   LUMBAR SPECIAL TESTS:  Slump test: Negative Breathing assessment: Limited diaphragmatic excursion primarily anterior chest breather ribs excessively splayed    TREATMENT DATE:                                                                                                                               08/31/2023  Therapeutic Exercise:   Supine: ball cervical rotation 3 minutes, cervical flexion/extension 3 minutes  Review of HEP- answered all questions, body scan education on how to practice Prone:   Seated   Standing: Neuromuscular Re-education   Manual Therapy: STM to cervical paraspinals and subocciptal release - tolerated well - reduced tension noted. Cervical traction by PT  Therapeutic Activity: Self Care:   Trigger Point Dry  Needling:  Modalities: thermotherapy to low back and left shoulder during supine interventions    PATIENT EDUCATION:  Education details: on  HEP  Person educated: Patient Education method: Programmer, multimedia, Facilities manager, and Handouts Education comprehension: verbalized understanding   HOME EXERCISE PROGRAM: [email or web address]  ASSESSMENT:  CLINICAL IMPRESSION: 08/31/2023 Focused on cervical spine secondary to shoulder symptoms really bothering patient on this date. Initially increased resting tone and muscle guarding noted but this reduced with education of reasons for this and conscious effort to reduce the guarding. Reduced headache but increased lower left arm symptoms noted. Overall continued left arm symptoms patients primary concern at this time, awaiting official MRI results.   EVAL: Patient presents to physical therapy with complaints of chronic low back pain with history of bariatric surgery, lumbar fusion and tummy tuck that is likely contributing to current presentation.  Patient with recent diagnosis of osteoporosis and recent increase in neck pain as well.  Patient presents limitations in posture, range of motion and strength that is likely contributing to current presentation.  Session focused on education as well as development of home exercise program.  Patient would greatly benefit from skilled PT to improve overall function and quality of life.  OBJECTIVE IMPAIRMENTS: decreased activity tolerance, decreased mobility, decreased ROM, decreased strength, improper body mechanics, postural dysfunction, and pain.   ACTIVITY LIMITATIONS: lifting, bending, standing, transfers, bed mobility, reach over head, and locomotion level  PARTICIPATION LIMITATIONS: cleaning, driving, and community activity  PERSONAL FACTORS: Age, Fitness, Time since onset of injury/illness/exacerbation, and 3+ comorbidities: lumbar fusion, tummy tuck, neck pain are also affecting patient's functional outcome.   REHAB  POTENTIAL: Good  CLINICAL DECISION MAKING: Evolving/moderate complexity  EVALUATION COMPLEXITY: Moderate   GOALS: Goals reviewed with patient? yes  SHORT TERM GOALS: Target date: 09/06/2023   Patient will be independent in self management strategies to improve quality of life and functional outcomes. Baseline: New Program Goal status: INITIAL  2.  Patient will report at least 50% improvement in overall symptoms and/or function to demonstrate improved functional mobility Baseline: 0% better Goal status: INITIAL  3.  Patient will demonstrate pain-free lumbar range of motion in all directions  Baseline: Painful Goal status: INITIAL  4.  Patient will report improved ergonomic set up at work to reduce stress on neck and back Baseline: None currently Goal status: INITIAL    LONG TERM GOALS: Target date: 10/18/2023    Patient will report at least 75% improvement in overall symptoms and/or function to demonstrate improved functional mobility Baseline: 0% better Goal status: INITIAL  2.  Patient will score at least 2 points higher on PSFS average to demonstrate change in overall function. Baseline: see above Goal status: INITIAL  3.  Patient will be able to demonstrate at least a 6-second exhale to demonstrate improved diaphragmatic excursion and core activation Baseline: Unable Goal status: INITIAL  4.  Patient will demonstrate at least 4 out of 5 manual muscle testing in bilateral lower extremities Baseline: See above Goal status: INITIAL   PLAN:  PT FREQUENCY: 1-2x/week  PT DURATION: 12 weeks  PLANNED INTERVENTIONS: 97110-Therapeutic exercises, 97530- Therapeutic activity, 97112- Neuromuscular re-education, 97535- Self Care, 16109- Manual therapy, [phone]- Gait training, [phone]- Orthotic Fit/training, [phone]- Canalith repositioning, [id number]- Aquatic Therapy, 97014- Electrical stimulation (unattended), [phone]- Ionotophoresis 4mg /ml Dexamethasone , Patient/Family education, Balance  training, Stair training, Taping, Dry Needling, Joint mobilization, Joint manipulation, Spinal manipulation, Spinal mobilization, Cryotherapy, and Moist heat   PLAN FOR NEXT SESSION: PSFS, posture/ergonomic position, breath work, hip and core strengthening, assess neck   Progress to prone as able - slow and controlled    8:44 AM, 08/31/23 Tabitha Ewings, DPT Physical Therapy with [place]

## 2023-09-05 ENCOUNTER — Encounter: Payer: 59 | Admitting: Physical Therapy

## 2023-09-07 ENCOUNTER — Encounter: Payer: 59 | Admitting: Physical Therapy

## 2023-09-07 ENCOUNTER — Telehealth: Payer: Self-pay | Admitting: Family Medicine

## 2023-09-07 NOTE — Telephone Encounter (Signed)
 Patient called stating that since yesterday morning she has been feeling dizzy off and on. She has also stopped taking her pain medication the past two days.  We have reached out radiology to have her MRIs read (done on 3/29).

## 2023-09-08 ENCOUNTER — Encounter: Payer: Self-pay | Admitting: Family Medicine

## 2023-09-08 NOTE — Progress Notes (Signed)
 Ben Elga Santy D.[facility] [address] [place] [address] Phone: [phone]   Assessment and Plan:     1. Tendinosis of left rotator cuff (Primary) 2. Chronic left shoulder pain -Chronic with exacerbation, subsequent visit - Patient presents with significant worsening left shoulder pain.  Most consistent with partial-thickness supraspinatus midsubstance tear without retraction and moderate supraspinatus tendinosis. - Reviewed patient's MRIs which showed midsubstance tear without retraction, and moderate supraspinatus tendinosis.  Additional findings of mild to moderate AC joint degenerative changes and mild degenerative changes with foraminal narrowing at C5-6, without clear neurologic impingement -Continue Tylenol for day-to-day pain relief.  May use muscle relaxers for breakthrough pain.  May use oxycodone for severe breakthrough pain. - Patient elected for subacromial CSI.  Tolerated well per note below.  Corticosteroid may temporarily increase blood glucose in patient with past medical history of DM type II  Procedure: Subacromial Injection Side: Left  Risks explained and consent was given verbally.  Patient is needle phobic, so was laid in right lateral decubitus position.  The site was cleaned with alcohol prep. A steroid injection was performed from posterior approach using 2mL of 1% lidocaine without epinephrine and 1mL of kenalog 40mg /ml. This was well tolerated and resulted in symptomatic relief.  Needle was removed, hemostasis achieved, and post injection instructions were explained.   Pt was advised to call or return to clinic if these symptoms worsen or fail to improve as anticipated.    Pertinent previous records reviewed include MRI C-spine and left shoulder 08/26/2023  Follow Up: 2 to 3 weeks for reevaluation.  Could consider restarting physical therapy.  Could consider ECSWT versus PRP injection.  If no improvement in symptoms,  could trial ESI   Subjective:   I, Moenique Parris, am serving as a Neurosurgeon for Doctor Ulysees Gander  Chief Complaint: left shoulder pain   HPI:  08/14/2023 Suzanne Gutierrez is a 58 y.o. female who presents to [facility] today for f/u neck and low back pain. Pt was last seen by Dr. Alease Hunter on 07/03/23 and was prescribed oxycodone, use existing muscle relaxers, Tylenol, heating pad, TENS unit, and was referred to PT, completing 3 visits.   Today, pt reports she was unable to stay at her last PT visit, 3/12, as she was experiencing L sided chest and L shoulder pain and was seen at the [place] walk in clinic. She stated XR shoulder a rib fx. Pt thinks she injured her rib choking on a Tylenol and trying to dislodge it. Decreased strength. Neck and back pain is about the same.  The x-ray report from [facility] showed a normal shoulder x-ray and a possible nondisplaced rib fracture in the left lower anterior chest. She is not particularly painful in the chest wall in that region.   She notes considerable pain in the left shoulder into the left upper arm and some pain rating below the level of the elbow and the left arm.   Dx imaging: 07/03/23 L-spine & c-spine XR  09/11/2023 Patient states pain is the same if not worse. Here for MRI results    Relevant Historical Information: Hypertension, DM type II  Additional pertinent review of systems negative.   Current Outpatient Medications:    ALPRAZolam (XANAX) 0.25 MG tablet, Take 0.25 mg by mouth daily as needed for anxiety. , Disp: , Rfl: 0   buPROPion (WELLBUTRIN XL) 150 MG 24 hr tablet, Take 150 mg by mouth every morning.,  Disp: , Rfl:    busPIRone (BUSPAR) 10 MG tablet, 1 TABLET ORALLY TWICE A DAY FOR ACUTE ANXIETY 90 DAYS, Disp: , Rfl:    Calcium Carb-Cholecalciferol (CALCIUM 600 + D PO), Take 1 tablet by mouth 3 (three) times daily., Disp: , Rfl:    clonazePAM (KLONOPIN) 0.5 MG tablet, Take 0.5 mg by mouth at bedtime.,  Disp: , Rfl:    clotrimazole-betamethasone (LOTRISONE) cream, Apply 1 application topically 2 (two) times daily., Disp: 30 g, Rfl: 0   cyclobenzaprine (FLEXERIL) 10 MG tablet, Take 1 tablet (10 mg total) by mouth 2 (two) times daily as needed for muscle spasms., Disp: 20 tablet, Rfl: 0   desvenlafaxine (PRISTIQ) 50 MG 24 hr tablet, , Disp: , Rfl:    eletriptan (RELPAX) 40 MG tablet, Take by mouth., Disp: , Rfl:    estradiol (ESTRACE) 0.1 MG/GM vaginal cream, INSERT 0.5 G PER VAGINA NIGHTLY 2-3 X PER WEEK, Disp: , Rfl:    lidocaine (LIDODERM) 5 %, Place 1 patch onto the skin daily. Remove & Discard patch within 12 hours or as directed by MD, Disp: 30 patch, Rfl: 0   Multiple Vitamin (MULTIVITAMIN WITH MINERALS) TABS tablet, Take 1 tablet by mouth daily. , Disp: , Rfl:    Multiple Vitamins-Minerals (BARIATRIC MULTIVITAMINS/IRON) CAPS, , Disp: , Rfl:    oxyCODONE-acetaminophen (PERCOCET/ROXICET) 5-325 MG tablet, Take 1 tablet by mouth every 8 (eight) hours as needed for severe pain (pain score 7-10)., Disp: 15 tablet, Rfl: 0   promethazine-dextromethorphan (PROMETHAZINE-DM) 6.25-15 MG/5ML syrup, Take 5 mLs by mouth 4 (four) times daily as needed for cough., Disp: 118 mL, Rfl: 0   topiramate (TOPAMAX) 100 MG tablet, Take 100 mg by mouth 2 (two) times daily., Disp: , Rfl:    traMADol (ULTRAM) 50 MG tablet, Take 50 mg by mouth every 6 (six) hours as needed (migraines). , Disp: , Rfl: 0   traZODone (DESYREL) 50 MG tablet, Take 50 mg by mouth at bedtime., Disp: , Rfl:    valACYclovir (VALTREX) 500 MG tablet, Take by mouth., Disp: , Rfl:    Objective:     Vitals:   09/11/23 1322  BP: 118/82  Pulse: 93  SpO2: (!) 10%  Weight: 143 lb (64.9 kg)  Height: 5' (1.524 m)      Body mass index is 27.93 kg/m.    Physical Exam:    Gen: Appears well, nad, nontoxic and pleasant Neuro:sensation intact, strength is 5/5 with df/pf/inv/ev, muscle tone wnl Skin: no suspicious lesion or defmority Psych: A&O,  appropriate mood and affect  Left shoulder:  No deformity, swelling or muscle wasting No scapular winging FF 110, abd 70, int 30, ext 80 TTP deltoid, AC, trapezius, coracoid, biceps groove NTTP over the [place], clavicle,  cervical spine Special testing limited due to limited ROM Negative Spurling's test bilat FROM of neck    Electronically signed by:  Marshall Skeeter D.[facility] 1:55 PM 09/11/23

## 2023-09-08 NOTE — Telephone Encounter (Signed)
 You have an appointment with Dr. Jean Rosenthal my partner on the 14th.  I think that could be helpful.

## 2023-09-08 NOTE — Progress Notes (Signed)
 Left shoulder MRI shows a partial tear of the rotator cuff tendon.  Is also some arthritis of the small joint at the top of the shoulder.  Recommend scheduling an appointment with me to go over the results in full detail and talk about treatment options including injection or surgery.

## 2023-09-11 ENCOUNTER — Ambulatory Visit: Admitting: Sports Medicine

## 2023-09-11 VITALS — BP 118/82 | HR 93 | Ht 60.0 in | Wt 143.0 lb

## 2023-09-11 DIAGNOSIS — M25512 Pain in left shoulder: Secondary | ICD-10-CM

## 2023-09-11 DIAGNOSIS — G8929 Other chronic pain: Secondary | ICD-10-CM | POA: Diagnosis not present

## 2023-09-11 DIAGNOSIS — M67814 Other specified disorders of tendon, left shoulder: Secondary | ICD-10-CM

## 2023-09-11 NOTE — Patient Instructions (Signed)
 Recommend holding off on PT for 2 weeks  Follow up 2-3 weeks with us  or Dr. Alease Hunter

## 2023-09-18 ENCOUNTER — Other Ambulatory Visit: Payer: Self-pay

## 2023-09-18 ENCOUNTER — Encounter: Payer: Self-pay | Admitting: Family Medicine

## 2023-09-18 DIAGNOSIS — M542 Cervicalgia: Secondary | ICD-10-CM

## 2023-09-18 NOTE — Progress Notes (Signed)
 MRI shows a little bit of potential for pinched nerve at C5-C6.  This could cause some of the arm pain you are experiencing.  I think ordering an epidural steroid injection is a good idea here and could help.  You are scheduled to see my partner Dr. Cleora Daft in May.  Do you want me to order an epidural steroid injection sooner than that?

## 2023-09-19 ENCOUNTER — Other Ambulatory Visit: Payer: Self-pay

## 2023-09-19 ENCOUNTER — Ambulatory Visit: Admitting: Family Medicine

## 2023-09-19 ENCOUNTER — Encounter: Payer: Self-pay | Admitting: Family Medicine

## 2023-09-20 MED ORDER — OXYCODONE-ACETAMINOPHEN 5-325 MG PO TABS
1.0000 | ORAL_TABLET | Freq: Three times a day (TID) | ORAL | 0 refills | Status: DC | PRN
Start: 2023-09-20 — End: 2023-10-03

## 2023-09-20 NOTE — Discharge Instructions (Signed)

## 2023-09-20 NOTE — Telephone Encounter (Signed)
 Rx has been refilled by Dr. Alease Hunter.

## 2023-09-21 ENCOUNTER — Ambulatory Visit
Admission: RE | Admit: 2023-09-21 | Discharge: 2023-09-21 | Disposition: A | Discharge status: Home / Self Care | Source: Ambulatory Visit | Attending: Family Medicine | Admitting: Family Medicine

## 2023-09-21 DIAGNOSIS — M542 Cervicalgia: Secondary | ICD-10-CM

## 2023-09-21 MED ORDER — IOPAMIDOL (ISOVUE-M 300) INJECTION 61%
1.0000 mL | Freq: Once | INTRAMUSCULAR | Status: AC | PRN
  Administered 2023-09-21: 1 mL via EPIDURAL

## 2023-09-21 MED ORDER — TRIAMCINOLONE ACETONIDE 40 MG/ML IJ SUSP (RADIOLOGY)
60.0000 mg | Freq: Once | INTRAMUSCULAR | Status: AC
  Administered 2023-09-21: 60 mg via EPIDURAL

## 2023-09-28 NOTE — Telephone Encounter (Signed)
 Prolia  VOB initiated via [email or web address]  Next Prolia  inj DUE: 11/08/23

## 2023-09-29 NOTE — Progress Notes (Unsigned)
 Ben Jackson D.[facility] [address] [place] [address] Phone: [phone]   Assessment and Plan:     There are no diagnoses linked to this encounter.  ***   Pertinent previous records reviewed include ***    Follow Up: ***     Subjective:   I, Suzanne Gutierrez, am serving as a Neurosurgeon for Doctor Ulysees Gander   Chief Complaint: left shoulder pain    HPI:  08/14/2023 Suzanne Gutierrez is a 58 y.o. female who presents to [facility] today for f/u neck and low back pain. Pt was last seen by Dr. Alease Hunter on 07/03/23 and was prescribed oxycodone , use existing muscle relaxers, Tylenol , heating pad, TENS unit, and was referred to PT, completing 3 visits.   Today, pt reports she was unable to stay at her last PT visit, 3/12, as she was experiencing L sided chest and L shoulder pain and was seen at the [place] walk in clinic. She stated XR shoulder a rib fx. Pt thinks she injured her rib choking on a Tylenol  and trying to dislodge it. Decreased strength. Neck and back pain is about the same.  The x-ray report from [facility] showed a normal shoulder x-ray and a possible nondisplaced rib fracture in the left lower anterior chest. She is not particularly painful in the chest wall in that region.   She notes considerable pain in the left shoulder into the left upper arm and some pain rating below the level of the elbow and the left arm.   Dx imaging: 07/03/23 L-spine & c-spine XR   09/11/2023 Patient states pain is the same if not worse. Here for MRI results   10/02/2023 Patient states     Relevant Historical Information: Hypertension, DM type II Additional pertinent review of systems negative.   Current Outpatient Medications:    ALPRAZolam (XANAX) 0.25 MG tablet, Take 0.25 mg by mouth daily as needed for anxiety. , Disp: , Rfl: 0   buPROPion (WELLBUTRIN XL) 150 MG 24 hr tablet, Take 150 mg by mouth every morning., Disp: ,  Rfl:    busPIRone (BUSPAR) 10 MG tablet, 1 TABLET ORALLY TWICE A DAY FOR ACUTE ANXIETY 90 DAYS, Disp: , Rfl:    Calcium  Carb-Cholecalciferol (CALCIUM  600 + D PO), Take 1 tablet by mouth 3 (three) times daily., Disp: , Rfl:    clonazePAM (KLONOPIN) 0.5 MG tablet, Take 0.5 mg by mouth at bedtime., Disp: , Rfl:    clotrimazole -betamethasone  (LOTRISONE ) cream, Apply 1 application topically 2 (two) times daily., Disp: 30 g, Rfl: 0   cyclobenzaprine  (FLEXERIL ) 10 MG tablet, Take 1 tablet (10 mg total) by mouth 2 (two) times daily as needed for muscle spasms., Disp: 20 tablet, Rfl: 0   desvenlafaxine (PRISTIQ) 50 MG 24 hr tablet, , Disp: , Rfl:    eletriptan (RELPAX) 40 MG tablet, Take by mouth., Disp: , Rfl:    estradiol (ESTRACE) 0.1 MG/GM vaginal cream, INSERT 0.5 G PER VAGINA NIGHTLY 2-3 X PER WEEK, Disp: , Rfl:    lidocaine  (LIDODERM ) 5 %, Place 1 patch onto the skin daily. Remove & Discard patch within 12 hours or as directed by MD, Disp: 30 patch, Rfl: 0   Multiple Vitamin (MULTIVITAMIN WITH MINERALS) TABS tablet, Take 1 tablet by mouth daily. , Disp: , Rfl:    Multiple Vitamins-Minerals (BARIATRIC MULTIVITAMINS/IRON) CAPS, , Disp: , Rfl:    oxyCODONE -acetaminophen  (PERCOCET/ROXICET) 5-325 MG tablet, Take 1 tablet by mouth  every 8 (eight) hours as needed for severe pain (pain score 7-10)., Disp: 15 tablet, Rfl: 0   promethazine -dextromethorphan (PROMETHAZINE -DM) 6.25-15 MG/5ML syrup, Take 5 mLs by mouth 4 (four) times daily as needed for cough., Disp: 118 mL, Rfl: 0   topiramate (TOPAMAX) 100 MG tablet, Take 100 mg by mouth 2 (two) times daily., Disp: , Rfl:    traMADol  (ULTRAM ) 50 MG tablet, Take 50 mg by mouth every 6 (six) hours as needed (migraines). , Disp: , Rfl: 0   traZODone (DESYREL) 50 MG tablet, Take 50 mg by mouth at bedtime., Disp: , Rfl:    valACYclovir (VALTREX) 500 MG tablet, Take by mouth., Disp: , Rfl:    Objective:     There were no vitals filed for this visit.    There is  no height or weight on file to calculate BMI.    Physical Exam:    ***   Electronically signed by:  Marshall Skeeter D[facility] 7:36 AM 09/29/23

## 2023-09-29 NOTE — Telephone Encounter (Signed)
 Patient is ready for scheduling on or after 11/08/23 BUY AND BILL  Eligible for [employer] Co-pay assistance  Out-of-pocket cost due at time of visit: $356.87  Primary: [employer] POS [employer] Prolia  co-insurance: 20% (approximately $331.87) Admin fee co-insurance: 20% (approximately $25)  Deductible: $5,000 of $5,000 met  OOP Max: $5,011.33 of $6,850.00 met (co-insurance will no longer apply once OOP max has been met.   Prior Auth: APPROVED PA# [id number] Valid: 04/26/23-04/25/24  Secondary: N/A Prolia  co-insurance:  Admin fee co-insurance:  Deductible:  Prior Auth:  PA# Valid:   ** This summary of benefits is an estimation of the patient's out-of-pocket cost. Exact cost may vary based on individual plan coverage.

## 2023-09-29 NOTE — Telephone Encounter (Signed)
 Prior Authorization for PROLIA  APPROVED PA# [id number] Valid: 04/26/23-04/25/24

## 2023-09-29 NOTE — Telephone Encounter (Signed)
 Prior Authorization REQUIRED for PROLIA   PA PROCESS DETAILS: Please ensure your patient meets medical necessity by reviewing Medical Policy  [phone] at [email or web address], and obtaining a pre-determination by contacting the PA dept at [phone].

## 2023-10-02 ENCOUNTER — Ambulatory Visit (INDEPENDENT_AMBULATORY_CARE_PROVIDER_SITE_OTHER): Admitting: Sports Medicine

## 2023-10-02 VITALS — BP 122/82 | HR 66 | Ht 60.0 in | Wt 139.0 lb

## 2023-10-02 DIAGNOSIS — M5010 Cervical disc disorder with radiculopathy, unspecified cervical region: Secondary | ICD-10-CM | POA: Diagnosis not present

## 2023-10-02 DIAGNOSIS — M542 Cervicalgia: Secondary | ICD-10-CM

## 2023-10-02 MED ORDER — CYCLOBENZAPRINE HCL 10 MG PO TABS: 10.0000 mg | ORAL_TABLET | Freq: Two times a day (BID) | ORAL | 0 refills | Status: AC | PRN

## 2023-10-02 NOTE — Telephone Encounter (Signed)
Scheduled 6/17 

## 2023-10-02 NOTE — Patient Instructions (Addendum)
 Epidural left C6-7 Flexeril  refill  Follow up 2 weeks after epidural to discuss results

## 2023-10-03 ENCOUNTER — Telehealth: Payer: Self-pay | Admitting: Family Medicine

## 2023-10-03 MED ORDER — OXYCODONE-ACETAMINOPHEN 5-325 MG PO TABS: 1.0000 | ORAL_TABLET | Freq: Three times a day (TID) | ORAL | 0 refills | Status: DC | PRN

## 2023-10-03 NOTE — Telephone Encounter (Signed)
-----   Message from [facility] sent at 10/02/2023  2:43 PM EDT ----- Patient would like a percocet refill. Thank you

## 2023-10-03 NOTE — Telephone Encounter (Signed)
 Patient requested refill of oxycodone .  Refill sent.

## 2023-10-12 ENCOUNTER — Encounter: Payer: Self-pay | Admitting: Sports Medicine

## 2023-10-12 NOTE — Discharge Instructions (Signed)

## 2023-10-16 ENCOUNTER — Other Ambulatory Visit: Payer: Self-pay | Admitting: Family Medicine

## 2023-10-16 ENCOUNTER — Ambulatory Visit
Admission: RE | Admit: 2023-10-16 | Discharge: 2023-10-16 | Disposition: A | Discharge status: Home / Self Care | Source: Ambulatory Visit | Attending: Sports Medicine | Admitting: Sports Medicine

## 2023-10-16 DIAGNOSIS — M542 Cervicalgia: Secondary | ICD-10-CM

## 2023-10-16 DIAGNOSIS — M5010 Cervical disc disorder with radiculopathy, unspecified cervical region: Secondary | ICD-10-CM

## 2023-10-16 MED ORDER — IOPAMIDOL (ISOVUE-M 300) INJECTION 61%
1.0000 mL | Freq: Once | INTRAMUSCULAR | Status: AC | PRN
  Administered 2023-10-16: 1 mL via EPIDURAL

## 2023-10-16 MED ORDER — TRIAMCINOLONE ACETONIDE 40 MG/ML IJ SUSP (RADIOLOGY)
60.0000 mg | Freq: Once | INTRAMUSCULAR | Status: AC
  Administered 2023-10-16: 60 mg via EPIDURAL

## 2023-10-16 NOTE — Telephone Encounter (Signed)
 Last OV 10/02/23 Next OV 11/13/23  Last refill 10/03/23 Qty #15/0

## 2023-10-16 NOTE — Telephone Encounter (Signed)
 Patient called requesting a refill on: oxyCODONE -acetaminophen  (PERCOCET/ROXICET) 5-325 MG tablet   Pharmacy: [facility] [id number] - [place]

## 2023-10-17 NOTE — Telephone Encounter (Signed)
 Patient called to check in to see if Dr. Alease Hunter got the message from yesterday regarding the refill of Percocet. Please advise.

## 2023-10-18 MED ORDER — OXYCODONE-ACETAMINOPHEN 5-325 MG PO TABS: 1.0000 | ORAL_TABLET | Freq: Three times a day (TID) | ORAL | 0 refills | Status: DC | PRN

## 2023-10-18 NOTE — Telephone Encounter (Signed)
 Refill sent in

## 2023-10-30 ENCOUNTER — Telehealth: Payer: Self-pay | Admitting: Family Medicine

## 2023-10-30 NOTE — Telephone Encounter (Signed)
 Patient says she is going out of town and leaving this Saturday and will be gone for a week. She says she does not want to be gone and not be able to get back for a refill. She says she wants to be able to have a back up while she is gone on vacation. She is asking if Dr. Alease Gutierrez could refill her percocet before this Friday as she has four left. Please advise.

## 2023-10-30 NOTE — Telephone Encounter (Signed)
 Patient called and asked if she could come in early and come this week to get her prolia  injection before she leaves to go out of town on Saturday of this week. Please advise.

## 2023-10-30 NOTE — Telephone Encounter (Signed)
 The soonest pt is eligible to receive Prolia  inj will be 11/08/23. There needs to be 6 months and 1 day minimum, between Prolia  injections. Her last injection was 05/09/23.

## 2023-11-01 MED ORDER — OXYCODONE-ACETAMINOPHEN 5-325 MG PO TABS: 1.0000 | ORAL_TABLET | Freq: Three times a day (TID) | ORAL | 0 refills | Status: DC | PRN

## 2023-11-01 NOTE — Telephone Encounter (Signed)
 Medication sent to pharmacy

## 2023-11-11 ENCOUNTER — Other Ambulatory Visit: Payer: Self-pay | Admitting: Physician Assistant

## 2023-11-11 ENCOUNTER — Other Ambulatory Visit: Payer: Self-pay

## 2023-11-11 ENCOUNTER — Ambulatory Visit
Admission: EM | Admit: 2023-11-11 | Discharge: 2023-11-11 | Disposition: A | Discharge status: Home / Self Care | Attending: Physician Assistant | Admitting: Physician Assistant

## 2023-11-11 DIAGNOSIS — J069 Acute upper respiratory infection, unspecified: Secondary | ICD-10-CM | POA: Diagnosis not present

## 2023-11-11 LAB — POCT RAPID STREP A (OFFICE): Rapid Strep A Screen: NEGATIVE

## 2023-11-11 LAB — POC COVID19/FLU A&B COMBO
Covid Antigen, POC: NEGATIVE
Influenza A Antigen, POC: NEGATIVE
Influenza B Antigen, POC: NEGATIVE

## 2023-11-11 MED ORDER — PROMETHAZINE-DM 6.25-15 MG/5ML PO SYRP: 5.0000 mL | ORAL_SOLUTION | Freq: Four times a day (QID) | ORAL | 0 refills | Status: AC | PRN

## 2023-11-11 MED ORDER — LIDOCAINE VISCOUS HCL 2 % MT SOLN: 15.0000 mL | Freq: Three times a day (TID) | OROMUCOSAL | 0 refills | Status: AC | PRN

## 2023-11-11 MED ORDER — ALBUTEROL SULFATE HFA 108 (90 BASE) MCG/ACT IN AERS: 1.0000 | INHALATION_SPRAY | Freq: Four times a day (QID) | RESPIRATORY_TRACT | 0 refills | Status: DC | PRN

## 2023-11-11 NOTE — Discharge Instructions (Addendum)
 Based on your described symptoms and the duration of symptoms it is likely that you have a viral upper respiratory infection (often called a cold)  Symptoms can last for 3-10 days with lingering cough and intermittent symptoms potentially  lasting several  weeks after that.  The goal of treatment at this time is to reduce your symptoms and discomfort   You can use the following medications and measures to help yourself feel better until your body fights this off: DayQuil/NyQuil, TheraFlu, Alka-Seltzer  (these medications typically have the same active ingredients in them so you can choose whichever one you prefer and take consistently during the day and night according to the manufactures instructions.) Flonase A daily antihistamine such as Zyrtec, Claritin, Allegra per your preference.  Please choose 1 and take consistently. Increased fluids.  It is recommended that you take in at least 64 ounces of water  per day when you are not sick so it is important to increase this when you are sick and your body may be running fever. Rest Cough drops Chloraseptic throat spray to help with sore throat Nasal saline spray or nasal flushes to help with congestion and runny nose  To help with your coughing I have sent in a medication called promethazine -dextromethorphan.  This medication helps with the coughing as well as potential nausea that can help with coughing spells.  You can use this up to every 6 hours as needed..  Please be advised that it can cause some drowsiness so please do not use it if you need to remain alert or drive  To help with your breathing and continue albuterol  inhaler.  You can use this as needed up to every 6 hours for shortness of breath and severe cough.  To help with your sore throat I have sent in a medication called viscous lidocaine  to be used as needed up to every 8 hours for sore throat   If your symptoms seem like they are getting worse over the next 5 to 7 days or not  improving you can always follow-up here in urgent care or go to your primary care provider for further management. Go to the ER if you begin to have more serious symptoms such as shortness of breath, trouble breathing, loss of consciousness, swelling around the eyes, high fever, severe lasting headaches, vision changes or neck pain/stiffness.

## 2023-11-11 NOTE — ED Triage Notes (Signed)
 Pt presents with complaints of sore throat, headaches, chills, nasal congestion, and bilateral ear ache x 4 days. Unsure of fevers at home. OTC Dayquil + Nyquil taken with little relief. Pt currently rates her overall pain a 2/10.

## 2023-11-11 NOTE — ED Provider Notes (Signed)
 Suzanne Gutierrez    CSN: [id number] Arrival date & time: 11/11/23  1451      History   Chief Complaint Chief Complaint  Patient presents with   Sore Throat    HPI Suzanne Gutierrez is a 58 y.o. female.   HPI  Pt presents today with concerns for sore throat, runny nose, congestion, sinus pressure and sinus pain along with headache She states she has been feeling like this since Wed night  and states symptoms have been progressing since then  She reports productive cough and chills as well as subjective fever.  Interventions: Dayquil, Nyquil and Tylenol   She denies recent sick contacts but just came back from the beach   Past Medical History:  Diagnosis Date   Bipolar disorder [facility])    patient denies; reports only having post partum depression 21 years ago    Elevated temperature    at PAT appt 99.3 ; denies fevers at home nor cold/flu sx; reports hot flahses due to menopause    GERD (gastroesophageal reflux disease)    Headache    Migraine   High cholesterol    per patient report    Hypertension    Lichen sclerosus of female genitalia    Morbid obesity with BMI of 40.0-44.9, adult (HCC)    Has been in the process of evaluation for GOP   Osteoarthritis    PONV (postoperative nausea and vomiting)     Patient Active Problem List   Diagnosis Date Noted   Age-related osteoporosis without current pathological fracture 04/17/2023   Arthropathy of lumbar facet joint 11/16/2020   Advanced osteoarthritis of spine 10/13/2020   Acute left-sided low back pain without sciatica 10/05/2020   Body mass index (BMI) 27.0-27.9, adult 10/05/2020   Elevated blood-pressure reading, without diagnosis of hypertension 10/05/2020   Numbness of hand 04/16/2020   Lichen sclerosus 09/13/2019   Digital mucous cyst 08/27/2019   Adhesive capsulitis of right shoulder 09/25/2018   Impingement syndrome of right shoulder region 08/14/2018   Migraine 03/27/2018   Morbid obesity with BMI of  40.0-44.9, adult (HCC) 12/25/2017   Costochondritis 08/08/2017   Diabetes mellitus without complication (HCC) 07/26/2017   Essential hypertension 07/24/2017   Left-sided chest wall pain 07/21/2017   DOE (dyspnea on exertion) 07/21/2017   Chest pain 07/21/2017   Abnormal finding on EKG 07/21/2017   Hemorrhoids 06/07/2017   Seasonal allergies 04/09/2011    Past Surgical History:  Procedure Laterality Date   APPENDECTOMY  1994   BIOPSY  07/13/2018   Procedure: BIOPSY;  Surgeon: Juanita Norlander, MD;  Location: [facility];  Service: General;;   breast lift Bilateral 05/2019   CARDIAC CATHETERIZATION  2016   [facility] --was told she had no significant disease.   CARDIAC CATHETERIZATION  2019   reports she had this done at [facility] at the same time as ECHO    CESAREAN SECTION     ESOPHAGOGASTRODUODENOSCOPY (EGD) WITH PROPOFOL  N/A 07/13/2018   Procedure: ESOPHAGOGASTRODUODENOSCOPY (EGD) WITH PROPOFOL ;  Surgeon: Juanita Norlander, MD;  Location: [facility];  Service: General;  Laterality: N/A;   GASTRIC ROUX-EN-Y N/A 12/25/2017   Procedure: LAPAROSCOPIC ROUX-EN-Y GASTRIC BYPASS WITH UPPER ENDOSCOPY, ERAS PATHWAY;  Surgeon: Ayesha Lente, MD;  Location: [facility];  Service: General;  Laterality: N/A;   HERNIA REPAIR     X2   NM MYOVIEW  LTD  07/27/2017    LOW risk.  EF 67%.  No reversible perfusion noted.  There is a fixed  defect in the lateral wall toward the apex likely breast attenuation.   TRANSTHORACIC ECHOCARDIOGRAM  07/27/2017   No regional wall motion abnormality.  (Poor quality).  Mild RV dilation.  Mild LA dilation.   TUBAL LIGATION     WISDOM TOOTH EXTRACTION      OB History     Gravida  2   Para  2   Term  2   Preterm      AB      Living  2      SAB      IAB      Ectopic      Multiple      Live Births               Home Medications    Prior to Admission medications   Medication Sig Start Date End Date Taking? Authorizing Provider   albuterol  (VENTOLIN  HFA) 108 (90 Base) MCG/ACT inhaler Inhale 1-2 puffs into the lungs every 6 (six) hours as needed for wheezing or shortness of breath. 11/11/23  Yes Kytzia Gienger E, PA-C  lidocaine  (XYLOCAINE ) 2 % solution Use as directed 15 mLs in the mouth or throat every 8 (eight) hours as needed for mouth pain. 11/11/23  Yes Jermal Dismuke E, PA-C  promethazine -dextromethorphan (PROMETHAZINE -DM) 6.25-15 MG/5ML syrup Take 5 mLs by mouth 4 (four) times daily as needed for cough. 11/11/23  Yes Danene Montijo E, PA-C  ALPRAZolam (XANAX) 0.25 MG tablet Take 0.25 mg by mouth daily as needed for anxiety.  01/23/18   [provider]  buPROPion (WELLBUTRIN XL) 150 MG 24 hr tablet Take 150 mg by mouth every morning. 12/28/20   [provider]  busPIRone (BUSPAR) 10 MG tablet 1 TABLET ORALLY TWICE A DAY FOR ACUTE ANXIETY 90 DAYS    [provider]  Calcium  Carb-Cholecalciferol (CALCIUM  600 + D PO) Take 1 tablet by mouth 3 (three) times daily.    [provider]  clonazePAM (KLONOPIN) 0.5 MG tablet Take 0.5 mg by mouth at bedtime.    [provider]  cloNIDine (CATAPRES) 0.1 MG tablet Take 0.1 mg by mouth every morning. 10/09/23   [provider]  clotrimazole -betamethasone  (LOTRISONE ) cream Apply 1 application topically 2 (two) times daily. 08/21/19   Clark-Burning, Bridgette Campus, PA-C  cyclobenzaprine  (FLEXERIL ) 10 MG tablet Take 1 tablet (10 mg total) by mouth 2 (two) times daily as needed for muscle spasms. 10/02/23   Ulysees Gander, DO  desvenlafaxine (PRISTIQ) 50 MG 24 hr tablet     [provider]  eletriptan (RELPAX) 40 MG tablet Take by mouth. 02/08/21   [provider]  estradiol (ESTRACE) 0.1 MG/GM vaginal cream INSERT 0.5 G PER VAGINA NIGHTLY 2-3 X PER WEEK    [provider]  lidocaine  (LIDODERM ) 5 % Place 1 patch onto the skin daily. Remove & Discard patch within 12 hours or as directed by MD 11/12/22   Denese Finn, PA-C   Multiple Vitamin (MULTIVITAMIN WITH MINERALS) TABS tablet Take 1 tablet by mouth daily.     [provider]  Multiple Vitamins-Minerals (BARIATRIC MULTIVITAMINS/IRON) CAPS  04/29/18   [provider]  oxyCODONE -acetaminophen  (PERCOCET/ROXICET) 5-325 MG tablet Take 1 tablet by mouth every 8 (eight) hours as needed for severe pain (pain score 7-10). 11/01/23   Syliva Even, MD  topiramate (TOPAMAX) 100 MG tablet Take 100 mg by mouth 2 (two) times daily.    [provider]  traMADol  (ULTRAM ) 50 MG tablet Take 50 mg  by mouth every 6 (six) hours as needed (migraines).  03/16/18   [provider]  traZODone (DESYREL) 50 MG tablet Take 50 mg by mouth at bedtime.    [provider]  valACYclovir (VALTREX) 500 MG tablet Take by mouth. 10/06/20   [provider]  pantoprazole  (PROTONIX ) 40 MG tablet Take 1 tablet (40 mg total) by mouth daily. 07/13/18 05/24/20  Juanita Norlander, MD    Family History Family History  Problem Relation Age of Onset   COPD Other    Cancer Other    Hypertension Other    Stroke Other    Diabetes Other    Dementia Mother    Diabetes Father        With diabetic retinopathy   Stroke Father    Stroke Sister    Other Brother        3 brothers with unknown history   Breast cancer Neg Hx     Social History Social History   Tobacco Use   Smoking status: Never   Smokeless tobacco: Never  Vaping Use   Vaping status: Never Used  Substance Use Topics   Alcohol use: Yes    Alcohol/week: 2.0 standard drinks of alcohol    Types: 2 Glasses of wine per week   Drug use: No     Allergies   Codeine, Nsaids, and Rizatriptan   Review of Systems Review of Systems  Constitutional:  Positive for chills and fatigue.  HENT:  Positive for congestion, ear pain (both hurt L>R), rhinorrhea, sinus pressure, sinus pain and sore throat.   Respiratory:  Positive for cough and shortness of breath (last night needed a few pillows to  help with this). Negative for wheezing.   Gastrointestinal:  Negative for diarrhea, nausea and vomiting.  Musculoskeletal:  Positive for myalgias.  Skin:  Negative for rash.  Neurological:  Positive for headaches.     Physical Exam Triage Vital Signs ED Triage Vitals  Encounter Vitals Group     BP 11/11/23 1500 (!) 133/94     Girls Systolic BP Percentile --      Girls Diastolic BP Percentile --      Boys Systolic BP Percentile --      Boys Diastolic BP Percentile --      Pulse Rate 11/11/23 1500 81     Resp 11/11/23 1500 16     Temp 11/11/23 1500 98.9 F (37.2 C)     Temp Source 11/11/23 1500 Oral     SpO2 11/11/23 1500 97 %     Weight 11/11/23 1500 128 lb (58.1 kg)     Height 11/11/23 1500 5' 1 (1.549 m)     Head Circumference --      Peak Flow --      Pain Score 11/11/23 1523 2     Pain Loc --      Pain Education --      Exclude from Growth Chart --    No data found.  Updated Vital Signs BP (!) 133/94 (BP Location: Right Arm)   Pulse 81   Temp 98.9 F (37.2 C) (Oral)   Resp 16   Ht 5' 1 (1.549 m)   Wt 128 lb (58.1 kg)   LMP 08/28/2017 (Approximate)   SpO2 97%   BMI 24.19 kg/m   Visual Acuity Right Eye Distance:   Left Eye Distance:   Bilateral Distance:    Right Eye Near:   Left Eye Near:    Bilateral Near:  Physical Exam Vitals reviewed.  Constitutional:      General: She is awake. She is not in acute distress.    Appearance: Normal appearance. She is well-developed and well-groomed. She is not ill-appearing or toxic-appearing.  HENT:     Head: Normocephalic and atraumatic.     Right Ear: Hearing, tympanic membrane and ear canal normal.     Left Ear: Hearing and ear canal normal. Tympanic membrane is retracted. Tympanic membrane is not scarred, perforated, erythematous or bulging.     Mouth/Throat:     Lips: Pink.     Mouth: Mucous membranes are moist.     Pharynx: Oropharynx is clear. Uvula midline. No pharyngeal swelling, oropharyngeal  exudate, posterior oropharyngeal erythema, uvula swelling or postnasal drip.   Cardiovascular:     Rate and Rhythm: Normal rate and regular rhythm.     Pulses: Normal pulses.          Radial pulses are 2+ on the right side and 2+ on the left side.     Heart sounds: Normal heart sounds. No murmur heard.    No friction rub. No gallop.  Pulmonary:     Effort: Pulmonary effort is normal.     Breath sounds: Normal breath sounds. Decreased air movement present. No decreased breath sounds, wheezing, rhonchi or rales.     Comments: Decreased air movement at lung bases    Musculoskeletal:     Cervical back: Normal range of motion and neck supple.  Lymphadenopathy:     Head:     Right side of head: No submental, submandibular or preauricular adenopathy.     Left side of head: No submental, submandibular or preauricular adenopathy.     Cervical:     Right cervical: No superficial cervical adenopathy.    Left cervical: No superficial cervical adenopathy.     Upper Body:     Right upper body: No supraclavicular adenopathy.     Left upper body: No supraclavicular adenopathy.   Neurological:     General: No focal deficit present.     Mental Status: She is alert and oriented to person, place, and time.   Psychiatric:        Mood and Affect: Mood normal.        Behavior: Behavior normal. Behavior is cooperative.      Gutierrez Treatments / Results  Labs (all labs ordered are listed, but only abnormal results are displayed) Labs Reviewed  POC COVID19/FLU A&B COMBO  POCT RAPID STREP A (OFFICE)    EKG   Radiology No results found.  Procedures Procedures (including critical care time)  Medications Ordered in Gutierrez Medications - No data to display  Initial Impression / Assessment and Plan / Gutierrez Course  I have reviewed the triage vital signs and the nursing notes.  Pertinent labs & imaging results that were available during my care of the patient were reviewed by me and considered in my  medical decision making (see chart for details).      Final Clinical Impressions(s) / Gutierrez Diagnoses   Final diagnoses:  Upper respiratory tract infection, unspecified type   Visit with patient indicates symptoms comprised of headache, productive cough, postnasal drainage, chills, sinus pressure and pain  since Wed (about 4 days) congruent with acute URI that is likely viral in nature  Rapid flu, COVID and strep testing were negative. Results discussed with patient during apt Due to nature and duration of symptoms recommended treatment regimen is symptomatic relief and follow up if  needed Will send in script for Albuterol  inhaler to assist with SOB and decreased air movement, Will send in Promethazine -DM for coughing and viscous lidocaine  for sore throat.  Discussed with patient the various viral and bacterial etiologies of current illness and appropriate course of treatment Discussed OTC medication options for multisymptom relief such as Dayquil/Nyquil, Theraflu, AlkaSeltzer, etc. Discussed return precautions if symptoms are not improving or worsen over next 5-7 days. ED precautions reviewed and provided in AVS. Follow up as needed.      Discharge Instructions      Based on your described symptoms and the duration of symptoms it is likely that you have a viral upper respiratory infection (often called a cold)  Symptoms can last for 3-10 days with lingering cough and intermittent symptoms potentially  lasting several  weeks after that.  The goal of treatment at this time is to reduce your symptoms and discomfort   You can use the following medications and measures to help yourself feel better until your body fights this off: DayQuil/NyQuil, TheraFlu, Alka-Seltzer  (these medications typically have the same active ingredients in them so you can choose whichever one you prefer and take consistently during the day and night according to the manufactures instructions.) Flonase A daily  antihistamine such as Zyrtec, Claritin, Allegra per your preference.  Please choose 1 and take consistently. Increased fluids.  It is recommended that you take in at least 64 ounces of water  per day when you are not sick so it is important to increase this when you are sick and your body may be running fever. Rest Cough drops Chloraseptic throat spray to help with sore throat Nasal saline spray or nasal flushes to help with congestion and runny nose  To help with your coughing I have sent in a medication called promethazine -dextromethorphan.  This medication helps with the coughing as well as potential nausea that can help with coughing spells.  You can use this up to every 6 hours as needed..  Please be advised that it can cause some drowsiness so please do not use it if you need to remain alert or drive  To help with your breathing and continue albuterol  inhaler.  You can use this as needed up to every 6 hours for shortness of breath and severe cough.  To help with your sore throat I have sent in a medication called viscous lidocaine  to be used as needed up to every 8 hours for sore throat   If your symptoms seem like they are getting worse over the next 5 to 7 days or not improving you can always follow-up here in urgent care or go to your primary care provider for further management. Go to the ER if you begin to have more serious symptoms such as shortness of breath, trouble breathing, loss of consciousness, swelling around the eyes, high fever, severe lasting headaches, vision changes or neck pain/stiffness.       ED Prescriptions     Medication Sig Dispense Auth. Provider   albuterol  (VENTOLIN  HFA) 108 (90 Base) MCG/ACT inhaler Inhale 1-2 puffs into the lungs every 6 (six) hours as needed for wheezing or shortness of breath. 8 g Rosselyn Martha E, PA-C   promethazine -dextromethorphan (PROMETHAZINE -DM) 6.25-15 MG/5ML syrup Take 5 mLs by mouth 4 (four) times daily as needed for cough. 118 mL  Jakhia Buxton E, PA-C   lidocaine  (XYLOCAINE ) 2 % solution Use as directed 15 mLs in the mouth or throat every 8 (eight) hours as needed  for mouth pain. 100 mL Charlsie Fleeger E, PA-C      PDMP not reviewed this encounter.   Jerona Mooring, PA-C 11/11/23 1612

## 2023-11-13 ENCOUNTER — Ambulatory Visit (INDEPENDENT_AMBULATORY_CARE_PROVIDER_SITE_OTHER): Admitting: Family Medicine

## 2023-11-13 ENCOUNTER — Other Ambulatory Visit: Payer: Self-pay

## 2023-11-13 VITALS — BP 98/72 | HR 78 | Ht 61.0 in | Wt 138.0 lb

## 2023-11-13 DIAGNOSIS — G8929 Other chronic pain: Secondary | ICD-10-CM

## 2023-11-13 DIAGNOSIS — M25512 Pain in left shoulder: Secondary | ICD-10-CM | POA: Diagnosis not present

## 2023-11-13 DIAGNOSIS — M810 Age-related osteoporosis without current pathological fracture: Secondary | ICD-10-CM

## 2023-11-13 DIAGNOSIS — M5010 Cervical disc disorder with radiculopathy, unspecified cervical region: Secondary | ICD-10-CM | POA: Diagnosis not present

## 2023-11-13 MED ORDER — OXYCODONE-ACETAMINOPHEN 5-325 MG PO TABS: 1.0000 | ORAL_TABLET | Freq: Three times a day (TID) | ORAL | 0 refills | Status: DC | PRN

## 2023-11-13 MED ORDER — DENOSUMAB 60 MG/ML ~~LOC~~ SOSY
60.0000 mg | PREFILLED_SYRINGE | Freq: Once | SUBCUTANEOUS | Status: AC
Start: 2023-11-13 — End: 2023-11-13
  Administered 2023-11-13: 60 mg via SUBCUTANEOUS

## 2023-11-13 NOTE — Telephone Encounter (Signed)
 Last Prolia  inj 11/13/23 Next Prolia  inj due 05/15/24

## 2023-11-13 NOTE — Patient Instructions (Addendum)
 Thank you for coming in today.   You received an injection today. Seek immediate medical attention if the joint becomes red, extremely painful, or is oozing fluid.   I've referred you to Dr. Jeris Montes at [facility].  Let us  know if you don't hear from them in one week.   Prolia  injection today, next due 05/15/24.

## 2023-11-13 NOTE — Progress Notes (Signed)
 Joanna Muck, PhD, LAT, ATC acting as a scribe for Garlan Juniper, MD.  Suzanne Gutierrez is a 58 y.o. female who presents to [facility] today for f/u neck pain post-ESI. Pt was last seen by Dr. Cleora Daft on 10/02/23. Percocet last refilled on 6/4. Cervical ESI May 19th.   Today, pt reports her neck is feels a lot better after the 2nd ESI. 50% improvement. Neck is still painful. Radicular pain into L arm.  She notes continued shoulder pain especially with abduction.  Pain is located in the left lateral shoulder extending into the upper arm.  Pt also here today for her next Prolia  injection  Dx imaging: 08/26/23 C-spine & L shoulder MRI 07/03/23 L-spine & c-spine XR   Pertinent review of systems: No fevers or chills  Relevant historical information: Hypertension and diabetes.   Exam:  BP 98/72   Pulse 78   Ht 5' 1 (1.549 m)   Wt 138 lb (62.6 kg)   LMP 08/28/2017 (Approximate)   SpO2 100%   BMI 26.07 kg/m  General: Well Developed, well nourished, and in no acute distress.   MSK: C-spine decreased cervical motion.  Upper extremity strength is intact.  Left shoulder: Normal-appearing normal motion pain with abduction intact strength.  Positive Hawkins and Neer's test.    Lab and Radiology Results  Procedure: Real-time Ultrasound Guided Injection of left shoulder subacromial bursa Device: Philips Affiniti 50G/GE Logiq Images permanently stored and available for review in PACS Verbal informed consent obtained.  Discussed risks and benefits of procedure. Warned about infection, bleeding, hyperglycemia damage to structures among others. Patient expresses understanding and agreement Time-out conducted.   Noted no overlying erythema, induration, or other signs of local infection.   Skin prepped in a sterile fashion.   Local anesthesia: Topical Ethyl chloride.   With sterile technique and under real time ultrasound guidance: 40 mg of Kenalog  and 2 mL  of Marcaine  injected into subacromial bursa. Fluid seen entering the bursa.   Completed without difficulty   Pain immediately resolved suggesting accurate placement of the medication.   Advised to call if fevers/chills, erythema, induration, drainage, or persistent bleeding.   Images permanently stored and available for review in the ultrasound unit.  Impression: Technically successful ultrasound guided injection.    EXAM: MRI CERVICAL SPINE WITHOUT CONTRAST   TECHNIQUE: Multiplanar, multisequence MR imaging of the cervical spine was performed. No intravenous contrast was administered.   COMPARISON:  Radiography 07/03/2023   FINDINGS: Alignment: No malalignment.   Vertebrae: No fracture or focal bone lesion. Minimal degenerative endplate edema at J1-9.   Cord: No cord compression or focal cord lesion.   Posterior Fossa, vertebral arteries, paraspinal tissues: Negative   Disc levels:   No abnormality from the foramen magnum through C4-5.   C5-6: Degenerative spondylosis with endplate osteophytes and bulging of the disc. No compressive central canal stenosis. Mild bilateral foraminal narrowing.   C6-7: Minimal disc bulge with no canal or foraminal stenosis.   C7-T1: Normal interspace.   IMPRESSION: 1. C5-6: Degenerative spondylosis with endplate osteophytes and bulging of the disc. No compressive central canal stenosis. Mild bilateral foraminal narrowing. 2. C6-7: Minimal disc bulge with no canal or foraminal stenosis.     Electronically Signed   By: Bettylou Brunner M.D.   On: 09/08/2023 14:39  EXAM: MRI OF THE LEFT SHOULDER WITHOUT CONTRAST   TECHNIQUE: Multiplanar, multisequence MR imaging of the shoulder was performed. No intravenous contrast was administered.  COMPARISON:  None Available.   FINDINGS: Rotator cuff: There is increased, near fluid bright signal within the midsubstance of the far anterior supraspinatus tendon in a region measuring up to 5 mm  in obliqued AP to craniocaudal dimension and extending to the border with the superior infraspinatus (sagittal series 6, images 12 and 13) suggesting a partial-thickness midsubstance tear without tendon retraction. There is moderate anterior supraspinatus intermediate T2 signal tendinosis throughout the entire tendon footprint in a region measuring up to 1.8 cm in AP dimension (sagittal series 6, image 13 and coronal series 4 images 10 through 12). The infraspinatus and teres minor are intact.   Muscles: No rotator cuff muscle atrophy, fatty infiltration, or edema.   Biceps long head: The intra-articular long head of the biceps tendon is intact.   Acromioclavicular Joint: There are mild-to-moderate degenerative changes of the acromioclavicular joint including joint space narrowing, subchondral marrow edema, and peripheral osteophytosis. Type II acromion. No fluid within the subacromial/subdeltoid bursa.   Glenohumeral Joint: Mild glenohumeral cartilage thinning.   Labrum: Grossly intact, but evaluation is limited by lack of intraarticular fluid.   Bones:  No acute fracture.   Other: None.   IMPRESSION: 1. Partial-thickness midsubstance tear of the far anterior supraspinatus tendon measuring up to 5 mm without tendon retraction. Moderate anterior supraspinatus tendinosis. 2. Mild-to-moderate degenerative changes of the acromioclavicular joint.     Electronically Signed   By: Bertina Broccoli M.D.   On: 09/07/2023 13:47 I, Garlan Juniper, personally (independently) visualized and performed the interpretation of the images attached in this note.   Assessment and Plan: 58 y.o. female with multifactorial neck and left shoulder pain.  Some of her pain is cervical radiculopathy and has had some improvement with 2 epidural steroid injections performed in late April in her mid May.  This resulted in about 50% improvement of her pain.  She also has some pain in the left shoulder area due  to rotator cuff tendinitis with a partial tear seen on MRI of the shoulder.  Plan on subacromial injection today.  After discussion we will refer to neurosurgery to have a discussion of her surgical options.  I am reluctant to use her last epidural steroid injection so rapidly. Oxycodone  prescribed for pain control.  From a osteoporosis standpoint Prolia  given today in clinic by CMA.  PDMP not reviewed this encounter. Orders Placed This Encounter  Procedures   US  LIMITED JOINT SPACE STRUCTURES UP LEFT(NO LINKED CHARGES)    Reason for Exam (SYMPTOM  OR DIAGNOSIS REQUIRED):   left shoulder pain    Preferred imaging location?:   [facility] referral to Neurosurgery    Referral Priority:   Routine    Referral Type:   Surgical    Referral Reason:   Specialty Services Required    Referred to Provider:   Jodeen Munch, MD    Requested Specialty:   Neurosurgery    Number of Visits Requested:   1   Meds ordered this encounter  Medications   denosumab  (PROLIA ) injection 60 mg    Patient is enrolled in REMS program for this medication and I have provided a copy of the Prolia  Medication Guide and Patient Brochure.:   Yes    I have reviewed with the patient the information in the Prolia  Medication Guide and Patient Counseling Chart including the serious risks of Prolia  and symptoms of each risk.:   Yes    I have advised the patient to seek medical  attention if they have signs or symptoms of any of the serious risks.:   Yes   oxyCODONE -acetaminophen  (PERCOCET/ROXICET) 5-325 MG tablet    Sig: Take 1 tablet by mouth every 8 (eight) hours as needed for severe pain (pain score 7-10).    Dispense:  15 tablet    Refill:  0    Patient will be out of town     Discussed warning signs or symptoms. Please see discharge instructions. Patient expresses understanding.   The above documentation has been reviewed and is accurate and complete Garlan Juniper, M.D.

## 2023-11-14 ENCOUNTER — Ambulatory Visit: Payer: 59

## 2023-11-22 NOTE — Progress Notes (Unsigned)
 Referring Physician:  Joane Artist RAMAN, MD [address] [place],  [place] [address]  Primary Physician:  Merilee, LITTIE.Addie, MD (Inactive)  History of Present Illness: 11/23/2023 [name] is here today with a chief complaint of neck pain that radiates into her forearm.  She is having some trouble opening things with the left arm.  Her pain has been about 6 to 8 months.  Laying flat and laying on her left side bothers her.  Picking up heavy items with her left arm hurts her.  Her pain is mostly in her upper arm.  She has had some discomfort in her forearm as well.  She has some neck pain also.  She has tried ice and heat as well as pain medication.  She has had an epidural injection.  She does have pain with active range of motion of her left shoulder.  Bowel/Bladder Dysfunction: none  Conservative measures:  Physical therapy: has participated in PT at [facility]  Multimodal medical therapy including regular antiinflammatories: Flexeril , Lidocaine  patches, Oxycodone , Tramadol   Injections: 10/16/2023 C6-7 ESI 09/21/2023 C6-7 ESI   Past Surgery: L5-S1 2022 surgeon: Dr. Gillie Cloretta Suzanne Gutierrez has no symptoms of cervical myelopathy.  The symptoms are causing a significant impact on the patient's life.   I have utilized the care everywhere function in epic to review the outside records available from external health systems.  Review of Systems:  A 10 point review of systems is negative, except for the pertinent positives and negatives detailed in the HPI.  Past Medical History: Past Medical History:  Diagnosis Date   Bipolar disorder [facility])    patient denies; reports only having post partum depression 21 years ago    Elevated temperature    at PAT appt 99.3 ; denies fevers at home nor cold/flu sx; reports hot flahses due to menopause    GERD (gastroesophageal reflux disease)    Headache    Migraine   High cholesterol    per patient report    Hypertension    Lichen  sclerosus of female genitalia    Morbid obesity with BMI of 40.0-44.9, adult (HCC)    Has been in the process of evaluation for GOP   Osteoarthritis    PONV (postoperative nausea and vomiting)     Past Surgical History: Past Surgical History:  Procedure Laterality Date   APPENDECTOMY  1994   BIOPSY  07/13/2018   Procedure: BIOPSY;  Surgeon: Ethyl Lenis, MD;  Location: [facility];  Service: General;;   breast lift Bilateral 05/2019   CARDIAC CATHETERIZATION  2016   [facility] --was told she had no significant disease.   CARDIAC CATHETERIZATION  2019   reports she had this done at [facility] at the same time as ECHO    CESAREAN SECTION     ESOPHAGOGASTRODUODENOSCOPY (EGD) WITH PROPOFOL  N/A 07/13/2018   Procedure: ESOPHAGOGASTRODUODENOSCOPY (EGD) WITH PROPOFOL ;  Surgeon: Ethyl Lenis, MD;  Location: [facility];  Service: General;  Laterality: N/A;   GASTRIC ROUX-EN-Y N/A 12/25/2017   Procedure: LAPAROSCOPIC ROUX-EN-Y GASTRIC BYPASS WITH UPPER ENDOSCOPY, ERAS PATHWAY;  Surgeon: Mikell Katz, MD;  Location: [facility];  Service: General;  Laterality: N/A;   HERNIA REPAIR     X2   NM MYOVIEW  LTD  07/27/2017    LOW risk.  EF 67%.  No reversible perfusion noted.  There is a fixed defect in the lateral wall toward the apex likely breast attenuation.   TRANSTHORACIC ECHOCARDIOGRAM  07/27/2017  No regional wall motion abnormality.  (Poor quality).  Mild RV dilation.  Mild LA dilation.   TUBAL LIGATION     WISDOM TOOTH EXTRACTION      Allergies: Allergies as of 11/23/2023 - Review Complete 11/13/2023  Allergen Reaction Noted   Codeine Nausea And Vomiting 09/03/2012   Nsaids  11/29/2018   Rizatriptan  05/26/2021    Medications:  Current Outpatient Medications:    ALPRAZolam (XANAX) 0.25 MG tablet, Take 0.25 mg by mouth daily as needed for anxiety. , Disp: , Rfl: 0   buPROPion (WELLBUTRIN XL) 150 MG 24 hr tablet, Take 150 mg by mouth every morning., Disp: , Rfl:     busPIRone (BUSPAR) 10 MG tablet, 1 TABLET ORALLY TWICE A DAY FOR ACUTE ANXIETY 90 DAYS, Disp: , Rfl:    Calcium  Carb-Cholecalciferol (CALCIUM  600 + D PO), Take 1 tablet by mouth 3 (three) times daily., Disp: , Rfl:    clonazePAM (KLONOPIN) 0.5 MG tablet, Take 0.5 mg by mouth at bedtime., Disp: , Rfl:    cloNIDine (CATAPRES) 0.1 MG tablet, Take 0.1 mg by mouth every morning., Disp: , Rfl:    clotrimazole -betamethasone  (LOTRISONE ) cream, Apply 1 application topically 2 (two) times daily., Disp: 30 g, Rfl: 0   cyclobenzaprine  (FLEXERIL ) 10 MG tablet, Take 1 tablet (10 mg total) by mouth 2 (two) times daily as needed for muscle spasms., Disp: 20 tablet, Rfl: 0   desvenlafaxine (PRISTIQ) 50 MG 24 hr tablet, , Disp: , Rfl:    eletriptan (RELPAX) 40 MG tablet, Take by mouth., Disp: , Rfl:    estradiol (ESTRACE) 0.1 MG/GM vaginal cream, INSERT 0.5 G PER VAGINA NIGHTLY 2-3 X PER WEEK, Disp: , Rfl:    levalbuterol (XOPENEX HFA) 45 MCG/ACT inhaler, Inhale 1-2 puffs into the lungs every 6 (six) hours as needed for wheezing., Disp: 15 g, Rfl: 0   lidocaine  (LIDODERM ) 5 %, Place 1 patch onto the skin daily. Remove & Discard patch within 12 hours or as directed by MD, Disp: 30 patch, Rfl: 0   lidocaine  (XYLOCAINE ) 2 % solution, Use as directed 15 mLs in the mouth or throat every 8 (eight) hours as needed for mouth pain., Disp: 100 mL, Rfl: 0   Multiple Vitamin (MULTIVITAMIN WITH MINERALS) TABS tablet, Take 1 tablet by mouth daily. , Disp: , Rfl:    Multiple Vitamins-Minerals (BARIATRIC MULTIVITAMINS/IRON) CAPS, , Disp: , Rfl:    oxyCODONE -acetaminophen  (PERCOCET/ROXICET) 5-325 MG tablet, Take 1 tablet by mouth every 8 (eight) hours as needed for severe pain (pain score 7-10)., Disp: 15 tablet, Rfl: 0   promethazine -dextromethorphan (PROMETHAZINE -DM) 6.25-15 MG/5ML syrup, Take 5 mLs by mouth 4 (four) times daily as needed for cough., Disp: 118 mL, Rfl: 0   topiramate (TOPAMAX) 100 MG tablet, Take 100 mg by mouth  2 (two) times daily., Disp: , Rfl:    traMADol  (ULTRAM ) 50 MG tablet, Take 50 mg by mouth every 6 (six) hours as needed (migraines).  (Patient not taking: Reported on 11/13/2023), Disp: , Rfl: 0   traZODone (DESYREL) 50 MG tablet, Take 50 mg by mouth at bedtime., Disp: , Rfl:    valACYclovir (VALTREX) 500 MG tablet, Take by mouth., Disp: , Rfl:   Social History: Social History   Tobacco Use   Smoking status: Never   Smokeless tobacco: Never  Vaping Use   Vaping status: Never Used  Substance Use Topics   Alcohol use: Yes    Alcohol/week: 2.0 standard drinks of alcohol    Types: 2  Glasses of wine per week   Drug use: No    Family Medical History: Family History  Problem Relation Age of Onset   COPD Other    Cancer Other    Hypertension Other    Stroke Other    Diabetes Other    Dementia Mother    Diabetes Father        With diabetic retinopathy   Stroke Father    Stroke Sister    Other Brother        3 brothers with unknown history   Breast cancer Neg Hx     Physical Examination: There were no vitals filed for this visit.  General: Patient is in no apparent distress. Attention to examination is appropriate.  Neck:   Supple.  Full range of motion.  Respiratory: Patient is breathing without any difficulty.   NEUROLOGICAL:     Awake, alert, oriented to person, place, and time.  Speech is clear and fluent.   Cranial Nerves: Pupils equal round and reactive to light.  Facial tone is symmetric.  Facial sensation is symmetric. Shoulder shrug is symmetric. Tongue protrusion is midline.  There is no pronator drift.  Strength: Side Biceps Triceps Deltoid Interossei Grip Wrist Ext. Wrist Flex.  R 5 5 5 5 5 5 5   L 4 5 4+ 5 5 5 5    Side Iliopsoas Quads Hamstring PF DF EHL  R 5 5 5 5 5 5   L 5 5 5 5 5 5    4/5 L subscapularis (causes pain) and L shoulder abduction (causes pain)  Reflexes are 1+ and symmetric at the biceps, triceps, brachioradialis, patella and achilles.    Hoffman's is absent.   Bilateral upper and lower extremity sensation is intact to light touch.    No evidence of dysmetria noted.  Gait is normal.  +TTP L anterior shoulder and pain with biceps flexion     Medical Decision Making  Imaging: MRI C spine 08/26/2023 FINDINGS: Alignment: No malalignment.   Vertebrae: No fracture or focal bone lesion. Minimal degenerative endplate edema at R4-3.   Cord: No cord compression or focal cord lesion.   Posterior Fossa, vertebral arteries, paraspinal tissues: Negative   Disc levels:   No abnormality from the foramen magnum through C4-5.   C5-6: Degenerative spondylosis with endplate osteophytes and bulging of the disc. No compressive central canal stenosis. Mild bilateral foraminal narrowing.   C6-7: Minimal disc bulge with no canal or foraminal stenosis.   C7-T1: Normal interspace.   IMPRESSION: 1. C5-6: Degenerative spondylosis with endplate osteophytes and bulging of the disc. No compressive central canal stenosis. Mild bilateral foraminal narrowing. 2. C6-7: Minimal disc bulge with no canal or foraminal stenosis.     Electronically Signed   By: Oneil Officer M.D.   On: 09/08/2023 14:39  MRI Shoulder 08/26/2023 IMPRESSION: 1. Partial-thickness midsubstance tear of the far anterior supraspinatus tendon measuring up to 5 mm without tendon retraction. Moderate anterior supraspinatus tendinosis. 2. Mild-to-moderate degenerative changes of the acromioclavicular joint.     Electronically Signed   By: Tanda Lyons M.D.   On: 09/07/2023 13:47  I have personally reviewed the images and agree with the above interpretation.  Assessment and Plan: [name] is a pleasant 58 y.o. female with neck pain and left shoulder pain.  I feel that her left arm symptoms are more consistent with shoulder mediated pathology.  She has a supraspinatus tendon tear on her MRI of the shoulder.  Her imaging findings on her  cervical spine MRI scan  would not explain her left arm pain, as she does not have significant compression of any of her left-sided nerve roots.  I have recommended that she discuss the findings with her orthopedic surgeon.  I made a referral for this.  I would not recommend cervical surgery at this time.  I gave her exercises for her neck.  If she would like to have more formal physical therapy evaluation, I would be happy to send her.   I will be happy to see her back in the future if she has any other spine related issues.  I reviewed her lumbar spine films which show what appears to be a well-healed L5-S1 fusion.  I spent a total of 30 minutes in this patient's care today. This time was spent reviewing pertinent records including imaging studies, obtaining and confirming history, performing a directed evaluation, formulating and discussing my recommendations, and documenting the visit within the medical record.   Thank you for involving me in the care of this patient.      Zorana Brockwell K. Clois MD, [facility]

## 2023-11-23 ENCOUNTER — Ambulatory Visit: Admitting: Neurosurgery

## 2023-11-23 DIAGNOSIS — M542 Cervicalgia: Secondary | ICD-10-CM | POA: Diagnosis not present

## 2023-11-23 DIAGNOSIS — M75102 Unspecified rotator cuff tear or rupture of left shoulder, not specified as traumatic: Secondary | ICD-10-CM

## 2023-12-04 ENCOUNTER — Telehealth: Payer: Self-pay

## 2023-12-04 NOTE — Telephone Encounter (Addendum)
 Prior Authorization initiated for OXYcodone -APAP via [email or web address] KEY: [id number]

## 2023-12-04 NOTE — Telephone Encounter (Signed)
 Oxycodone -APAP 5-325 tab  [email or web address] KEY: [id number]

## 2023-12-05 NOTE — Telephone Encounter (Signed)
 Outcome N/A on July 7 by [facility] NCPDP 2017 Your PA request has been closed. Drug Name: oxyCODONE -Acetaminophen  5-325MG  OR TABS Diagnosis: M25.512 - Pain in left shoulder, G89.29 - Other chronic pain Clinical Information/Required documentation: Mock claim pays 90+ days out, no pa is required.  Response faxed to pharmacy.

## 2023-12-08 ENCOUNTER — Telehealth: Payer: Self-pay | Admitting: Family Medicine

## 2023-12-08 NOTE — Telephone Encounter (Signed)
 Patient called requesting a refill on: oxyCODONE -acetaminophen  (PERCOCET/ROXICET) 5-325 MG tablet   Pharmacy: [facility]

## 2023-12-11 MED ORDER — OXYCODONE-ACETAMINOPHEN 5-325 MG PO TABS: 1.0000 | ORAL_TABLET | Freq: Three times a day (TID) | ORAL | 0 refills | Status: DC | PRN

## 2023-12-11 NOTE — Telephone Encounter (Signed)
 Oxycodone refilled.

## 2024-01-04 ENCOUNTER — Telehealth: Payer: Self-pay | Admitting: Family Medicine

## 2024-01-04 ENCOUNTER — Other Ambulatory Visit: Payer: Self-pay | Admitting: Sports Medicine

## 2024-01-04 DIAGNOSIS — R0789 Other chest pain: Secondary | ICD-10-CM

## 2024-01-04 DIAGNOSIS — M940 Chondrocostal junction syndrome [Tietze]: Secondary | ICD-10-CM

## 2024-01-04 DIAGNOSIS — M479 Spondylosis, unspecified: Secondary | ICD-10-CM

## 2024-01-04 DIAGNOSIS — M7541 Impingement syndrome of right shoulder: Secondary | ICD-10-CM

## 2024-01-04 MED ORDER — OXYCODONE-ACETAMINOPHEN 5-325 MG PO TABS: 1.0000 | ORAL_TABLET | Freq: Three times a day (TID) | ORAL | 0 refills | Status: AC | PRN

## 2024-01-04 NOTE — Telephone Encounter (Signed)
 Patient called asking if a refill could be sent in for:  oxyCODONE -acetaminophen  (PERCOCET/ROXICET) 5-325 MG tablet    She also wanted to let Dr Joane know that she is scheduled shoulder surgery is scheduled for Monday.

## 2024-01-04 NOTE — Progress Notes (Signed)
 Will provide short-term medication refilled while original prescribing provider is out of office.  All future refills should be directed to original prescribing provider.

## 2024-04-16 NOTE — Telephone Encounter (Signed)
 Prolia  VOB initiated via [email or web address]  Next Prolia  inj DUE: 05/15/24

## 2024-04-17 NOTE — Telephone Encounter (Signed)
 [employer] - Prior Authorization REQUIRED for [employer]

## 2024-04-24 NOTE — Telephone Encounter (Signed)
 [employer]  Prior Authorization for PROLIA  APPROVED PA# [id number] Valid: 04/24/24-04/24/25

## 2024-04-24 NOTE — Telephone Encounter (Signed)
 Scheduled

## 2024-04-24 NOTE — Telephone Encounter (Signed)
 [employer]  Patient is ready for scheduling on or after 05/15/24  Out-of-pocket cost due at time of visit: $0  Primary: UHC POS [employer] Plus Prolia  co-insurance: 0% Admin fee co-insurance: 0% OOP Max: [id number] of $6,850 met  Deductible: $5,000 of $5,000 met  Prior Auth: APPROVED PA# [id number] Valid: 04/24/24-04/24/25  Secondary: N/A Prolia  co-insurance:  Admin fee co-insurance:  Deductible:  Prior Auth:  PA# Valid:   ** This summary of benefits is an estimation of the patient's out-of-pocket cost. Exact cost may vary based on individual plan coverage.

## 2024-05-13 NOTE — Progress Notes (Signed)
 COVID Vaccine received:  []  No [x]  Yes Date of any COVID positive Test in last 90 days:  PCP -  Marcus Close, PA Cardiologist -   Chest x-ray - 07-26-2017  2v  Epic EKG -  2019 Stress Test - 07-27-2017  Epic ECHO - 07-27-2017  Epic Cardiac Cath -  x 2  last 07-27-2017 Epic CT Coronary Calcium  score:   Pacemaker / ICD device [x]  No []  Yes   Spinal Cord Stimulator:[x]  No []  Yes       History of Sleep Apnea? []  No []  Yes   CPAP used?- []  No []  Yes    Medication on DOS: Clonidine  Levalbuterol Inhaler  Hold DOS: no Prolia  inj.    Patient has: []  NO Hx DM   [x]  Pre-DM   []  DM1  []   DM2 Does the patient monitor blood sugar?   []  N/A   [x]  No []  Yes  Last A1c was: 5.7 on   12-19-2017   Checks Blood Sugar _____ times a day  Blood Thinner / Instructions:  none Aspirin  Instructions:  none  Activity level: Able to walk up 2 flights of stairs without becoming significantly short of breath or having chest pain?   []    Yes   []  No,  would have:  Patient can perform ADLs without assistance.  []   Yes    []  No  Comments:   Anesthesia review: HTN, Pre-DM  DOE, Migraine, PONV,  s/p Gastric Roux-en-y bypass 2019, anxiety, GERD,  Patient denies any S&S of respiratory illness or Covid - no shortness of breath, fever, cough or chest pain at PAT appointment.  Patient verbalized understanding and agreement to the Pre-Surgical Instructions that were given to them at this PAT appointment. Patient was also educated of the need to review these PAT instructions again prior to her surgery.I reviewed the appropriate phone numbers to call if they have any and questions or concerns.

## 2024-05-13 NOTE — Patient Instructions (Signed)
 SURGICAL WAITING ROOM VISITATION Patients having surgery or a procedure may have no more than 2 support people in the waiting area - these visitors may rotate in the visitor waiting room.   If the patient needs to stay at the hospital during part of their recovery, the visitor guidelines for inpatient rooms apply.  PRE-OP VISITATION  Pre-op nurse will coordinate an appropriate time for 1 support person to accompany the patient in pre-op.  This support person may not rotate.  This visitor will be contacted when the time is appropriate for the visitor to come back in the pre-op area.  Temporary Visitor Restrictions   Children ages 48 and under will not be able to visit patients in [facility] under most circumstances. Visitation is not restricted outside of hospitals unless noted otherwise in the [facility] and Location Specific Visitation Guidelines at :       [email or web address]  Visitors with respiratory illnesses are discouraged from visiting and should remain at home.  You are not required to quarantine at this time prior to your surgery. However, you must do this: Hand Hygiene often Do NOT share personal items Notify your provider if you are in close contact with someone who has COVID or you develop fever 100.4 or greater, new onset of sneezing, cough, sore throat, shortness of breath or body aches.  If you test positive for Covid or have been in contact with anyone that has tested positive in the last 10 days please notify you surgeon.    Your procedure is scheduled on:  Friday  05-24-2024  Report to [facility] Main Entrance: [address] entrance where the [employer] is available.   Report to admitting at:  11:45   AM  Call this number if you have any questions or problems the morning of surgery [phone]  FOLLOW ANY ADDITIONAL PRE OP INSTRUCTIONS YOU RECEIVED FROM YOUR SURGEON'S OFFICE!!!  Do not eat food after Midnight the night prior to your  surgery/procedure.  After Midnight you may have the following liquids until  11:00 AM DAY OF SURGERY  Clear Liquid Diet Water  Black Coffee (sugar ok, NO MILK/CREAM OR CREAMERS)  Tea (sugar ok, NO MILK/CREAM OR CREAMERS) regular and decaf                             Plain Jell-O  with no fruit (NO RED)                                           Fruit ices (not with fruit pulp, NO RED)                                     Popsicles (NO RED)                                                                  Juice: NO CITRUS JUICES: only apple, WHITE grape, WHITE cranberry Sports drinks like Gatorade or Powerade (NO RED)  The day of surgery:  Drink ONE (1) Pre-Surgery  G2 at 11:00 AM the morning of surgery. Drink in one sitting. Do not sip.  This drink was given to you during your hospital pre-op appointment visit. Nothing else to drink after completing the Pre-Surgery G2 : No candy, chewing gum or throat lozenges.     Oral Hygiene is also important to reduce your risk of infection.        Remember - BRUSH YOUR TEETH THE MORNING OF SURGERY WITH YOUR REGULAR TOOTHPASTE  Do NOT smoke after Midnight the night before surgery.  STOP TAKING all Vitamins, Herbs and supplements 1 week before your surgery.   Take ONLY these medicines the morning of surgery with A SIP OF WATER : Clonidine, Desvenlafaxine (Pristiq), Topiramate (Topamax), and Alprazolam if needed for anxiety. You may take Oxycodone  IR if needed.                   You may not have any metal on your body including hair pins, jewelry, and body piercing  Do not wear make-up, lotions, powders, perfumes or deodorant  Do not wear nail polish including gel and S&S, artificial / acrylic nails, or any other type of covering on natural nails including finger and toenails. If you have artificial nails, gel coating, etc., that needs to be removed by a nail salon, Please have this removed prior to surgery. Not doing so may mean  that your surgery could be cancelled or delayed if the Surgeon or anesthesia staff feels like they are unable to monitor you safely.   Do not shave 48 hours prior to surgery to avoid nicks in your skin which may contribute to postoperative infections.   Contacts, Hearing Aids, dentures or bridgework may not be worn into surgery. DENTURES WILL BE REMOVED PRIOR TO SURGERY PLEASE DO NOT APPLY Poly grip OR ADHESIVES!!!  Patients discharged on the day of surgery will not be allowed to drive home.  Someone NEEDS to stay with you for the first 24 hours after anesthesia.  Do not bring your home medications to the hospital. The Pharmacy will dispense medications listed on your medication list to you during your admission in the Hospital.  Please read over the following fact sheets you were given: IF YOU HAVE QUESTIONS ABOUT YOUR PRE-OP INSTRUCTIONS, PLEASE CALL [phone]   [facility] - Preparing for Surgery        Before surgery, you can play an important role.  Because skin is not sterile, your skin needs to be as free of germs as possible.  You can reduce the number of germs on your skin by washing with CHG (chlorahexidine gluconate) soap before surgery.  CHG is an antiseptic cleaner which kills germs and bonds with the skin to continue killing germs even after washing. Please DO NOT use if you have an allergy  to CHG or antibacterial soaps.  If your skin becomes reddened/irritated stop using the CHG and inform your nurse when you arrive at Short Stay. Do not shave (including legs and underarms) for at least 48 hours prior to the first CHG shower.  You may shave your face/neck.  Please follow these instructions carefully:  1.  Shower with CHG Soap the night before surgery ONLY (DO NOT USE THE CHG SOAP THE MORNING OF SURGERY).  2.  If you choose to wash your hair, wash your hair first as usual with your normal  shampoo.  3.  After you shampoo, rinse your hair and body thoroughly  to remove the  shampoo.                             4.  Use CHG as you would any other liquid soap.  You can apply chg directly to the skin and wash.  Gently with a scrungie or clean washcloth.  5.  Apply the CHG Soap to your body ONLY FROM THE NECK DOWN.   Do not use on face/ open                           Wound or open sores. Avoid contact with eyes, ears mouth and genitals (private parts).                       Wash face,  Genitals (private parts) with your normal soap.             6.  Wash thoroughly, paying special attention to the area where your  surgery  will be performed.  7.  Thoroughly rinse your body with warm water  from the neck down.  8.  DO NOT shower/wash with your normal soap after using and rinsing off the CHG Soap.                9.  Pat yourself dry with a clean towel.            10.  Wear clean pajamas.            11.  Place clean sheets on your bed the night of your first shower and do not  sleep with pets.  Day of Surgery : Do not apply any CHG, lotions/deodorants the morning of surgery.  Please wear clean clothes to the hospital/surgery center.   FAILURE TO FOLLOW THESE INSTRUCTIONS MAY RESULT IN THE CANCELLATION OF YOUR SURGERY  PATIENT SIGNATURE_________________________________  NURSE SIGNATURE__________________________________  ________________________________________________________________________         Suzanne Gutierrez    An incentive spirometer is a tool that can help keep your lungs clear and active. This tool measures how well you are filling your lungs with each breath. Taking long deep breaths may help reverse or decrease the chance of developing breathing (pulmonary) problems (especially infection) following: A long period of time when you are unable to move or be active. BEFORE THE PROCEDURE  If the spirometer includes an indicator to show your best effort, your nurse or respiratory therapist will set it to a desired goal. If possible, sit up  straight or lean slightly forward. Try not to slouch. Hold the incentive spirometer in an upright position. INSTRUCTIONS FOR USE  Sit on the edge of your bed if possible, or sit up as far as you can in bed or on a chair. Hold the incentive spirometer in an upright position. Breathe out normally. Place the mouthpiece in your mouth and seal your lips tightly around it. Breathe in slowly and as deeply as possible, raising the piston or the ball toward the top of the column. Hold your breath for 3-5 seconds or for as long as possible. Allow the piston or ball to fall to the bottom of the column. Remove the mouthpiece from your mouth and breathe out normally. Rest for a few seconds and repeat Steps 1 through 7 at least 10 times every 1-2 hours when you are awake. Take your time and take a few normal breaths between  deep breaths. The spirometer may include an indicator to show your best effort. Use the indicator as a goal to work toward during each repetition. After each set of 10 deep breaths, practice coughing to be sure your lungs are clear. If you have an incision (the cut made at the time of surgery), support your incision when coughing by placing a pillow or rolled up towels firmly against it. Once you are able to get out of bed, walk around indoors and cough well. You may stop using the incentive spirometer when instructed by your caregiver.  RISKS AND COMPLICATIONS Take your time so you do not get dizzy or light-headed. If you are in pain, you may need to take or ask for pain medication before doing incentive spirometry. It is harder to take a deep breath if you are having pain. AFTER USE Rest and breathe slowly and easily. It can be helpful to keep track of a log of your progress. Your caregiver can provide you with a simple table to help with this. If you are using the spirometer at home, follow these instructions: SEEK MEDICAL CARE IF:  You are having difficultly using the spirometer. You  have trouble using the spirometer as often as instructed. Your pain medication is not giving enough relief while using the spirometer. You develop fever of 100.5 F (38.1 C) or higher.                                                                                                    SEEK IMMEDIATE MEDICAL CARE IF:  You cough up bloody sputum that had not been present before. You develop fever of 102 F (38.9 C) or greater. You develop worsening pain at or near the incision site. MAKE SURE YOU:  Understand these instructions. Will watch your condition. Will get help right away if you are not doing well or get worse. Document Released: 09/26/2006 Document Revised: 08/08/2011 Document Reviewed: 11/27/2006 [facility] Patient Information 2014 [place], [place].

## 2024-05-15 ENCOUNTER — Ambulatory Visit

## 2024-05-15 ENCOUNTER — Encounter (HOSPITAL_COMMUNITY): Payer: Self-pay

## 2024-05-15 ENCOUNTER — Other Ambulatory Visit: Payer: Self-pay

## 2024-05-15 ENCOUNTER — Encounter (HOSPITAL_COMMUNITY): Admission: RE | Admit: 2024-05-15 | Discharge: 2024-05-15 | Attending: Orthopedic Surgery

## 2024-05-15 VITALS — BP 116/80 | HR 78 | Temp 98.0°F | Resp 16 | Ht 61.0 in | Wt 137.0 lb

## 2024-05-15 DIAGNOSIS — Z01818 Encounter for other preprocedural examination: Secondary | ICD-10-CM | POA: Insufficient documentation

## 2024-05-15 DIAGNOSIS — I10 Essential (primary) hypertension: Secondary | ICD-10-CM | POA: Diagnosis not present

## 2024-05-15 DIAGNOSIS — M810 Age-related osteoporosis without current pathological fracture: Secondary | ICD-10-CM

## 2024-05-15 DIAGNOSIS — Z01812 Encounter for preprocedural laboratory examination: Secondary | ICD-10-CM | POA: Diagnosis present

## 2024-05-15 DIAGNOSIS — Z01810 Encounter for preprocedural cardiovascular examination: Secondary | ICD-10-CM | POA: Diagnosis present

## 2024-05-15 LAB — BASIC METABOLIC PANEL WITH GFR
Anion gap: 10 (ref 5–15)
BUN: 12 mg/dL (ref 6–20)
CO2: 23 mmol/L (ref 22–32)
Calcium: 9.3 mg/dL (ref 8.9–10.3)
Chloride: 109 mmol/L (ref 98–111)
Creatinine, Ser: 0.69 mg/dL (ref 0.44–1.00)
GFR, Estimated: >60 mL/min (ref >60)
Glucose, Bld: 89 mg/dL (ref 70–99)
Potassium: 4.2 mmol/L (ref 3.5–5.1)
Sodium: 141 mmol/L (ref 135–145)

## 2024-05-15 LAB — CBC
HCT: 42.3 % (ref 36.0–46.0)
Hemoglobin: 13.8 g/dL (ref 12.0–15.0)
MCH: 32.3 pg (ref 26.0–34.0)
MCHC: 32.6 g/dL (ref 30.0–36.0)
MCV: 99.1 fL (ref 80.0–100.0)
Platelets: 270 K/uL (ref 150–400)
RBC: 4.27 MIL/uL (ref 3.87–5.11)
RDW: 12 % (ref 11.5–15.5)
WBC: 5.9 K/uL (ref 4.0–10.5)
nRBC: 0 % (ref 0.0–0.2)

## 2024-05-15 MED ORDER — DENOSUMAB 60 MG/ML ~~LOC~~ SOSY
60.0000 mg | PREFILLED_SYRINGE | Freq: Once | SUBCUTANEOUS | Status: AC
  Administered 2024-05-15: 09:00:00 60 mg via SUBCUTANEOUS

## 2024-05-15 MED ORDER — DENOSUMAB 60 MG/ML ~~LOC~~ SOSY: 60.0000 mg | PREFILLED_SYRINGE | SUBCUTANEOUS | Status: DC

## 2024-05-15 NOTE — Progress Notes (Signed)
 Patient received PROLIA  injection today, SQ, right upper arm, tolerated well.   Medical [employer]  MFGBETHA Pleva LOT#: [id number] EXP: [id number] NDC: 44486-289-78   Medical Buy and Bill   Prior Authorization for PROLIA  APPROVED PA# [id number] Valid: 04/24/24-04/24/25

## 2024-05-15 NOTE — Telephone Encounter (Signed)
 Last Prolia  inj 05/15/24 Next Prolia  inj due 11/14/24

## 2024-05-24 ENCOUNTER — Ambulatory Visit (HOSPITAL_COMMUNITY)
Admission: RE | Admit: 2024-05-24 | Discharge: 2024-05-24 | Disposition: A | Discharge status: Home / Self Care | Source: Ambulatory Visit | Attending: Orthopedic Surgery | Admitting: Orthopedic Surgery

## 2024-05-24 ENCOUNTER — Ambulatory Visit (HOSPITAL_COMMUNITY): Payer: Self-pay | Admitting: Medical

## 2024-05-24 ENCOUNTER — Encounter (HOSPITAL_COMMUNITY): Payer: Self-pay | Admitting: Orthopedic Surgery

## 2024-05-24 ENCOUNTER — Ambulatory Visit (HOSPITAL_COMMUNITY): Admitting: Anesthesiology

## 2024-05-24 ENCOUNTER — Encounter (HOSPITAL_COMMUNITY)
Admission: RE | Disposition: A | Discharge status: Home / Self Care | Payer: Self-pay | Source: Ambulatory Visit | Attending: Orthopedic Surgery

## 2024-05-24 DIAGNOSIS — E119 Type 2 diabetes mellitus without complications: Secondary | ICD-10-CM | POA: Insufficient documentation

## 2024-05-24 DIAGNOSIS — M67462 Ganglion, left knee: Secondary | ICD-10-CM | POA: Insufficient documentation

## 2024-05-24 DIAGNOSIS — F419 Anxiety disorder, unspecified: Secondary | ICD-10-CM

## 2024-05-24 DIAGNOSIS — Z8249 Family history of ischemic heart disease and other diseases of the circulatory system: Secondary | ICD-10-CM | POA: Insufficient documentation

## 2024-05-24 DIAGNOSIS — M65862 Other synovitis and tenosynovitis, left lower leg: Secondary | ICD-10-CM | POA: Diagnosis not present

## 2024-05-24 DIAGNOSIS — Z79899 Other long term (current) drug therapy: Secondary | ICD-10-CM | POA: Diagnosis not present

## 2024-05-24 DIAGNOSIS — I10 Essential (primary) hypertension: Secondary | ICD-10-CM

## 2024-05-24 DIAGNOSIS — M65962 Unspecified synovitis and tenosynovitis, left lower leg: Secondary | ICD-10-CM | POA: Insufficient documentation

## 2024-05-24 DIAGNOSIS — M1990 Unspecified osteoarthritis, unspecified site: Secondary | ICD-10-CM | POA: Diagnosis not present

## 2024-05-24 DIAGNOSIS — M7502 Adhesive capsulitis of left shoulder: Secondary | ICD-10-CM | POA: Insufficient documentation

## 2024-05-24 DIAGNOSIS — Z833 Family history of diabetes mellitus: Secondary | ICD-10-CM | POA: Insufficient documentation

## 2024-05-24 DIAGNOSIS — K219 Gastro-esophageal reflux disease without esophagitis: Secondary | ICD-10-CM | POA: Diagnosis not present

## 2024-05-24 DIAGNOSIS — M794 Hypertrophy of (infrapatellar) fat pad: Secondary | ICD-10-CM | POA: Diagnosis not present

## 2024-05-24 HISTORY — PX: KNEE ARTHROSCOPY WITH EXCISION BAKER'S CYST: SHX5646

## 2024-05-24 HISTORY — PX: SHOULDER CLOSED REDUCTION: SHX1051

## 2024-05-24 SURGERY — ARTHROSCOPY, KNEE, WITH SYNOVIAL CYST EXCISION OF POPLITEAL SPACE
Anesthesia: General | Site: Shoulder | Laterality: Left

## 2024-05-24 MED ORDER — OXYCODONE HCL 5 MG PO TABS
ORAL_TABLET | ORAL | Status: AC
  Filled 2024-05-24: qty 1

## 2024-05-24 MED ORDER — EPINEPHRINE PF 1 MG/ML IJ SOLN
INTRAMUSCULAR | Status: AC
  Filled 2024-05-24: qty 1

## 2024-05-24 MED ORDER — FENTANYL CITRATE (PF) 100 MCG/2ML IJ SOLN
INTRAMUSCULAR | Status: AC
  Filled 2024-05-24: qty 2

## 2024-05-24 MED ORDER — ONDANSETRON HCL 4 MG/2ML IJ SOLN
INTRAMUSCULAR | Status: DC | PRN
  Administered 2024-05-24: 4 mg via INTRAVENOUS

## 2024-05-24 MED ORDER — PROPOFOL 500 MG/50ML IV EMUL
INTRAVENOUS | Status: DC | PRN
  Administered 2024-05-24: 25 ug/kg/min via INTRAVENOUS

## 2024-05-24 MED ORDER — PROPOFOL 10 MG/ML IV BOLUS
INTRAVENOUS | Status: DC | PRN
  Administered 2024-05-24: 100 mg via INTRAVENOUS
  Administered 2024-05-24: 200 mg via INTRAVENOUS

## 2024-05-24 MED ORDER — MIDAZOLAM HCL 5 MG/5ML IJ SOLN
INTRAMUSCULAR | Status: DC | PRN
  Administered 2024-05-24: 2 mg via INTRAVENOUS

## 2024-05-24 MED ORDER — ORAL CARE MOUTH RINSE: 15.0000 mL | Freq: Once | OROMUCOSAL | Status: AC

## 2024-05-24 MED ORDER — CEFAZOLIN SODIUM-DEXTROSE 2-4 GM/100ML-% IV SOLN
2.0000 g | INTRAVENOUS | Status: AC
  Administered 2024-05-24: 2 g via INTRAVENOUS
  Filled 2024-05-24: qty 100

## 2024-05-24 MED ORDER — OXYCODONE HCL 5 MG/5ML PO SOLN: 5.0000 mg | Freq: Once | ORAL | Status: AC | PRN

## 2024-05-24 MED ORDER — DEXAMETHASONE SODIUM PHOSPHATE 4 MG/ML IJ SOLN
INTRAMUSCULAR | Status: DC | PRN
  Administered 2024-05-24: 10 mg via INTRAVENOUS

## 2024-05-24 MED ORDER — OXYCODONE HCL 5 MG PO TABS
5.0000 mg | ORAL_TABLET | Freq: Once | ORAL | Status: AC | PRN
  Administered 2024-05-24: 5 mg via ORAL

## 2024-05-24 MED ORDER — BUPIVACAINE HCL 0.25 % IJ SOLN
INTRAMUSCULAR | Status: DC | PRN
  Administered 2024-05-24: 20 mL

## 2024-05-24 MED ORDER — HYDROMORPHONE HCL 1 MG/ML IJ SOLN
INTRAMUSCULAR | Status: AC
  Filled 2024-05-24: qty 1

## 2024-05-24 MED ORDER — DEXMEDETOMIDINE HCL IN NACL 80 MCG/20ML IV SOLN
INTRAVENOUS | Status: AC
  Filled 2024-05-24: qty 20

## 2024-05-24 MED ORDER — ONDANSETRON HCL 4 MG/2ML IJ SOLN
INTRAMUSCULAR | Status: AC
  Filled 2024-05-24: qty 2

## 2024-05-24 MED ORDER — CHLORHEXIDINE GLUCONATE 0.12 % MT SOLN
15.0000 mL | Freq: Once | OROMUCOSAL | Status: AC
  Administered 2024-05-24: 15 mL via OROMUCOSAL

## 2024-05-24 MED ORDER — LIDOCAINE HCL (PF) 2 % IJ SOLN
INTRAMUSCULAR | Status: AC
  Filled 2024-05-24: qty 5

## 2024-05-24 MED ORDER — HYDROMORPHONE HCL 1 MG/ML IJ SOLN
0.2500 mg | INTRAMUSCULAR | Status: DC | PRN
  Administered 2024-05-24 (×4): 0.5 mg via INTRAVENOUS

## 2024-05-24 MED ORDER — DEXMEDETOMIDINE HCL IN NACL 80 MCG/20ML IV SOLN
INTRAVENOUS | Status: DC | PRN
  Administered 2024-05-24 (×2): 4 ug via INTRAVENOUS
  Administered 2024-05-24: 8 ug via INTRAVENOUS

## 2024-05-24 MED ORDER — MIDAZOLAM HCL 2 MG/2ML IJ SOLN
INTRAMUSCULAR | Status: AC
  Filled 2024-05-24: qty 2

## 2024-05-24 MED ORDER — FENTANYL CITRATE (PF) 100 MCG/2ML IJ SOLN
INTRAMUSCULAR | Status: DC | PRN
  Administered 2024-05-24: 100 ug via INTRAVENOUS

## 2024-05-24 MED ORDER — BUPIVACAINE HCL (PF) 0.25 % IJ SOLN
INTRAMUSCULAR | Status: AC
  Filled 2024-05-24: qty 30

## 2024-05-24 MED ORDER — EPINEPHRINE 1 MG/ML IJ SOLN
INTRAMUSCULAR | Status: DC | PRN
  Administered 2024-05-24: 1 mg

## 2024-05-24 MED ORDER — PROPOFOL 10 MG/ML IV BOLUS
INTRAVENOUS | Status: AC
  Filled 2024-05-24: qty 20

## 2024-05-24 MED ORDER — OXYCODONE HCL 5 MG PO TABS: 5.0000 mg | ORAL_TABLET | ORAL | 0 refills | Status: AC | PRN

## 2024-05-24 MED ORDER — AMISULPRIDE (ANTIEMETIC) 5 MG/2ML IV SOLN: 10.0000 mg | Freq: Once | INTRAVENOUS | Status: DC | PRN

## 2024-05-24 MED ORDER — SODIUM CHLORIDE 0.9 % IR SOLN
Status: DC | PRN
  Administered 2024-05-24: 6000 mL

## 2024-05-24 MED ORDER — LIDOCAINE 2% (20 MG/ML) 5 ML SYRINGE
INTRAMUSCULAR | Status: DC | PRN
  Administered 2024-05-24: 100 mg via INTRAVENOUS

## 2024-05-24 MED ORDER — LACTATED RINGERS IV SOLN: INTRAVENOUS | Status: DC

## 2024-05-24 MED ORDER — MEPERIDINE HCL 25 MG/ML IJ SOLN: 6.2500 mg | INTRAMUSCULAR | Status: DC | PRN

## 2024-05-24 SURGICAL SUPPLY — 25 items
BLADE SHAVER TORPEDO 4X13 (MISCELLANEOUS) ×2 IMPLANT
BNDG COMPR ESMARK 6X3 LF (GAUZE/BANDAGES/DRESSINGS) IMPLANT
BNDG ELASTIC 4INX 5YD STR LF (GAUZE/BANDAGES/DRESSINGS) IMPLANT
BNDG ELASTIC 6INX 5YD STR LF (GAUZE/BANDAGES/DRESSINGS) ×2 IMPLANT
CUFF TRNQT CYL 34X4.125X (TOURNIQUET CUFF) ×2 IMPLANT
DRAPE ARTHROSCOPY W/POUCH 114 (DRAPES) ×2 IMPLANT
DRAPE U-SHAPE 47X51 STRL (DRAPES) ×2 IMPLANT
DURAPREP 26ML APPLICATOR (WOUND CARE) ×2 IMPLANT
ELECT PENCIL ROCKER SW 15FT (MISCELLANEOUS) IMPLANT
GAUZE PAD ABD 8X10 STRL (GAUZE/BANDAGES/DRESSINGS) ×2 IMPLANT
GAUZE SPONGE 4X4 12PLY STRL (GAUZE/BANDAGES/DRESSINGS) ×2 IMPLANT
GLOVE BIO SURGEON STRL SZ7.5 (GLOVE) ×4 IMPLANT
GLOVE BIOGEL PI IND STRL 8 (GLOVE) ×4 IMPLANT
GOWN STRL REUS W/ TWL XL LVL3 (GOWN DISPOSABLE) ×4 IMPLANT
KIT BASIN OR (CUSTOM PROCEDURE TRAY) ×2 IMPLANT
MANIFOLD NEPTUNE II (INSTRUMENTS) ×2 IMPLANT
PACK ARTHROSCOPY WL (CUSTOM PROCEDURE TRAY) ×2 IMPLANT
PADDING CAST COTTON 6X4 STRL (CAST SUPPLIES) IMPLANT
PROTECTOR NERVE ULNAR (MISCELLANEOUS) ×2 IMPLANT
STRIP CLOSURE SKIN 1/2X4 (GAUZE/BANDAGES/DRESSINGS) ×2 IMPLANT
SUT ETHILON 4 0 PS 2 18 (SUTURE) IMPLANT
SUT MNCRL AB 3-0 PS2 18 (SUTURE) ×2 IMPLANT
TOWEL OR DSP ST BLU DLX 10/PK (DISPOSABLE) ×2 IMPLANT
TUBING ARTHROSCOPY IRRIG 16FT (MISCELLANEOUS) ×2 IMPLANT
WAND ABLATOR APOLLO I90 (BUR) IMPLANT

## 2024-05-24 NOTE — Anesthesia Preprocedure Evaluation (Addendum)
"                                    Anesthesia Evaluation  Patient identified by MRN, date of birth, ID band Patient awake    Reviewed: Allergy  & Precautions, H&P , NPO status , Patient's Chart, lab work & pertinent test results  History of Anesthesia Complications (+) PONV and history of anesthetic complications  Airway Mallampati: II  TM Distance: >3 FB Neck ROM: Full    Dental  (+) Dental Advisory Given   Pulmonary neg pulmonary ROS   Pulmonary exam normal breath sounds clear to auscultation       Cardiovascular hypertension, Pt. on medications + DOE  Normal cardiovascular exam Rhythm:Regular Rate:Normal     Neuro/Psych  Headaches  Anxiety      negative psych ROS   GI/Hepatic Neg liver ROS,GERD  ,,  Endo/Other  diabetes, Type 2    Renal/GU negative Renal ROS  negative genitourinary   Musculoskeletal  (+) Arthritis , Osteoarthritis,    Abdominal   Peds negative pediatric ROS (+)  Hematology negative hematology ROS (+)   Anesthesia Other Findings   Reproductive/Obstetrics negative OB ROS                              Anesthesia Physical Anesthesia Plan  ASA: 3  Anesthesia Plan: General   Post-op Pain Management:    Induction: Intravenous  PONV Risk Score and Plan: 4 or greater and Ondansetron , Dexamethasone , TIVA, Midazolam  and Treatment may vary due to age or medical condition  Airway Management Planned: LMA  Additional Equipment:   Intra-op Plan:   Post-operative Plan: Extubation in OR  Informed Consent: I have reviewed the patients History and Physical, chart, labs and discussed the procedure including the risks, benefits and alternatives for the proposed anesthesia with the patient or authorized representative who has indicated his/her understanding and acceptance.     Dental advisory given  Plan Discussed with: CRNA  Anesthesia Plan Comments:         Anesthesia Quick Evaluation  "

## 2024-05-24 NOTE — Brief Op Note (Signed)
 05/24/2024  10:22 AM  PATIENT:  Suzanne Gutierrez  58 y.o. female  PRE-OPERATIVE DIAGNOSIS:  Left frozen shoulder, left knee synovitis, fat pad syndrome  POST-OPERATIVE DIAGNOSIS:  Left frozen shoulder, left knee synovitis, fat pad syndrome  PROCEDURE:  Procedures with comments: ARTHROSCOPY, KNEE, WITH SYNOVIAL CYST EXCISION OF POPLITEAL SPACE (Left) - Left knee scope with limited synovectomy and left shoulder manipualation under anesthesia  60 MANIPULATION, JOINT, SHOULDER, WITH ANESTHESIA (Left)  SURGEON:  Surgeons and Role:    * Sharl Selinda Dover, MD - Primary  PHYSICIAN ASSISTANT: Dayle Moores, PA-C   ANESTHESIA:   local and general  EBL:  5 mL   BLOOD ADMINISTERED:none  DRAINS: none   LOCAL MEDICATIONS USED:  MARCAINE      SPECIMEN:  No Specimen  DISPOSITION OF SPECIMEN:  N/A  COUNTS:  YES  TOURNIQUET:  * No tourniquets in log *  DICTATION: .Note written in EPIC  PLAN OF CARE: Discharge to home after PACU  PATIENT DISPOSITION:  PACU - hemodynamically stable.   Delay start of Pharmacological VTE agent (>24hrs) due to surgical blood loss or risk of bleeding: not applicable

## 2024-05-24 NOTE — Anesthesia Procedure Notes (Signed)
 Procedure Name: LMA Insertion Date/Time: 05/24/2024 9:50 AM  Performed by: Pam Macario BROCKS, CRNAPre-anesthesia Checklist: Patient identified, Emergency Drugs available, Suction available, Patient being monitored and Timeout performed Patient Re-evaluated:Patient Re-evaluated prior to induction Oxygen Delivery Method: Circle system utilized Preoxygenation: Pre-oxygenation with 100% oxygen Induction Type: IV induction Ventilation: Mask ventilation without difficulty LMA: LMA inserted LMA Size: 4.0 Number of attempts: 1 Airway Equipment and Method: Bite block Placement Confirmation: positive ETCO2, breath sounds checked- equal and bilateral and CO2 detector Tube secured with: Tape Dental Injury: Teeth and Oropharynx as per pre-operative assessment

## 2024-05-24 NOTE — Op Note (Addendum)
 Surgery Date: 05/24/2024  Surgeon(s): Sharl Selinda Dover, MD   Assistant: Dayle Moores, PA-C  Assistant attestation:  PA Mcclung was scrubbed and present for the entire procedure.  ANESTHESIA:  general, quarter percent plain Marcaine  20 cc infiltrated into the knee  FLUIDS: Per anesthesia record.   ESTIMATED BLOOD LOSS: minimal  PREOPERATIVE DIAGNOSES:  1.  Left knee fat pad syndrome 2.  Left frozen shoulder  POSTOPERATIVE DIAGNOSES:  same  PROCEDURES PERFORMED:  1.  Left knee arthroscopy with limited synovectomy 2.  Closed left shoulder manipulation under anesthesia  DESCRIPTION OF PROCEDURE: Ms. Newlun is a 58 y.o.-year-old female with left knee symptomatic fat pad syndrome with associated intra-articular ganglion off the anterior horn of the medial meniscus.  She does also have a fairly significantly frozen left shoulder status post arthroscopic surgery.  We discussed a manipulation under anesthesia of the shoulder.  Plans are to proceed with diagnostic arthroscopy with debridement as indicated. Full discussion held regarding risks benefits alternatives and complications related surgical intervention. Conservative care options reviewed. All questions answered.  The patient was identified in the preoperative holding area and the operative extremity was marked. The patient was brought to the operating room and transferred to operating table in a supine position. Satisfactory general anesthesia was induced by anesthesiology.    We began the procedure with the manipulation of the left shoulder under anesthesia.  On her examination under anesthesia she had forward elevation of 90, abduction of 40 and external rotation of 0 degrees and internal rotation of 0.  Once adequate anesthesia was obtained we were able to perform our manipulation of the joint.  With firm pressure we were able to release through significant scar tissue.  We felt the capsule tear in all planes.  We were able  to elevate to 170 degrees, external rotate to 50 and internal rotate to 50, abduction improved to 110.  We then turned our attention to the left knee arthroscopy.  Standard anterolateral, anteromedial arthroscopy portals were obtained. The anteromedial portal was obtained with a spinal needle for localization under direct visualization with subsequent diagnostic findings.   Anteromedial and anterolateral chambers: moderate synovitis. The synovitis was debrided with a 4.5 mm full radius shaver through both the anteromedial and lateral portals.   Suprapatellar pouch and gutters: moderate synovitis or debris. Patella chondral surface: Grade 0 Trochlear chondral surface: Grade 0 Patellofemoral tracking: Midline level Medial meniscus: Intact.  Medial femoral condyle flexion bearing surface: Grade 2 Medial femoral condyle extension bearing surface: Grade 2 Medial tibial plateau: Grade 2 Anterior cruciate ligament:stable Posterior cruciate ligament:stable Lateral meniscus: Intact without tear.   Lateral femoral condyle flexion bearing surface: Grade 0 Lateral femoral condyle extension bearing surface: Grade 0 Lateral tibial plateau: Grade 0  We did note pretty diffuse grade II chondromalacia in the medial compartment on the femur and tibia.  However no flap tears and no significantly unstable chondral fragments.  We then proceeded with the anterior synovectomy.  We were viewing from the lateral portal and working from the medial portal and performed an Hoffa's fat pad synovectomy.  We then switched 2 view from the medial portal and worked from the lateral portal and completed the synovectomy moving to the lateral half.  After completion of synovectomy, diagnostic exam, and debridements as described, all compartments were checked and no residual debris remained. Hemostasis was achieved with the cautery wand. The portals were approximated with buried monocryl. All excess fluid was expressed from the  joint.  Xeroform sterile gauze  dressings were applied followed by Ace bandage and ice pack.   DISPOSITION: The patient was awakened from general anesthetic, extubated, taken to the recovery room in medically stable condition, no apparent complications. The patient may be weightbearing as tolerated to the operative lower extremity.  Range of motion of the knee as tolerated.

## 2024-05-24 NOTE — Anesthesia Postprocedure Evaluation (Signed)
"   Anesthesia Post Note  Patient: Suzanne Gutierrez  Procedure(s) Performed: ARTHROSCOPY, KNEE, WITH SYNOVIAL CYST EXCISION OF POPLITEAL SPACE (Left: Knee) MANIPULATION, JOINT, SHOULDER, WITH ANESTHESIA (Left: Shoulder)     Patient location during evaluation: PACU Anesthesia Type: General Level of consciousness: awake and alert Pain management: pain level controlled Vital Signs Assessment: post-procedure vital signs reviewed and stable Respiratory status: spontaneous breathing, nonlabored ventilation and respiratory function stable Cardiovascular status: blood pressure returned to baseline and stable Postop Assessment: no apparent nausea or vomiting Anesthetic complications: no   No notable events documented.  Last Vitals:  Vitals:   05/24/24 1130 05/24/24 1150  BP: 123/81 (!) 129/98  Pulse: 74 77  Resp: 13 14  Temp: 36.4 C   SpO2: 95% 99%    Last Pain:  Vitals:   05/24/24 1150  TempSrc:   PainSc: 0-No pain                 Butler Levander Pinal      "

## 2024-05-24 NOTE — H&P (Signed)
 "  ORTHOPAEDIC H and P  REQUESTING PHYSICIAN: Sharl Suzanne Dover, MD  PCP:  [facility]  Chief Complaint: Left knee pain as well as left shoulder stiffness.  HPI: Suzanne Gutierrez is a 58 y.o. female who complains of left knee pain associated with intra-articular cyst noted on MRI.  She also has some postoperative adhesive capsulitis in the left shoulder.  Here today for arthroscopic surgery on the left knee and then close manipulation of the left shoulder while she is under anesthesia.  No new complaints.  Past Medical History:  Diagnosis Date   Anxiety    Elevated temperature    at PAT appt 99.3 ; denies fevers at home nor cold/flu sx; reports hot flahses due to menopause    GERD (gastroesophageal reflux disease)    Headache    Migraine   High cholesterol    per patient report    Hypertension    Lichen sclerosus of female genitalia    Morbid obesity with BMI of 40.0-44.9, adult (HCC)    Has been in the process of evaluation for GOP   Osteoarthritis    PONV (postoperative nausea and vomiting)    Pre-diabetes    Past Surgical History:  Procedure Laterality Date   APPENDECTOMY  1994   BACK SURGERY  2022   lumbar fusion L5-S1 done at [facility]   BIOPSY  07/13/2018   Procedure: BIOPSY;  Surgeon: Ethyl Lenis, MD;  Location: [facility];  Service: General;;   breast lift Bilateral 05/2019   CARDIAC CATHETERIZATION  2016   [facility] --was told she had no significant disease.   CARDIAC CATHETERIZATION  2019   reports she had this done at [facility] at the same time as ECHO    CESAREAN SECTION     ESOPHAGOGASTRODUODENOSCOPY (EGD) WITH PROPOFOL  N/A 07/13/2018   Procedure: ESOPHAGOGASTRODUODENOSCOPY (EGD) WITH PROPOFOL ;  Surgeon: Ethyl Lenis, MD;  Location: [facility];  Service: General;  Laterality: N/A;   GASTRIC ROUX-EN-Y N/A 12/25/2017   Procedure: LAPAROSCOPIC ROUX-EN-Y GASTRIC BYPASS WITH UPPER ENDOSCOPY, ERAS PATHWAY;  Surgeon:  Mikell Katz, MD;  Location: [facility];  Service: General;  Laterality: N/A;   HERNIA REPAIR     X2   NM MYOVIEW  LTD  07/27/2017    LOW risk.  EF 67%.  No reversible perfusion noted.  There is a fixed defect in the lateral wall toward the apex likely breast attenuation.   TRANSTHORACIC ECHOCARDIOGRAM  07/27/2017   No regional wall motion abnormality.  (Poor quality).  Mild RV dilation.  Mild LA dilation.   TUBAL LIGATION     WISDOM TOOTH EXTRACTION     Social History   Socioeconomic History   Marital status: Married    Spouse name: Not on file   Number of children: 2   Years of education: Not on file   Highest education level: High school graduate  Occupational History   Occupation: Surveyor, [employer]: [employer]  Tobacco Use   Smoking status: Never   Smokeless tobacco: Never  Vaping Use   Vaping status: Never Used  Substance and Sexual Activity   Alcohol use: Yes    Alcohol/week: 2.0 standard drinks of alcohol    Types: 2 Glasses of wine per week   Drug use: No   Sexual activity: Yes    Birth control/protection: None, Surgical  Other Topics Concern   Not on file  Social History Narrative   She tries to exercise  at least once a week doing 50 minutes on a stationary bike.  Otherwise no routine exercise.   Social Drivers of Health   Tobacco Use: Low Risk (05/15/2024)   Patient History    Smoking Tobacco Use: Never    Smokeless Tobacco Use: Never    Passive Exposure: Not on file  Financial Resource Strain: Not on file  Food Insecurity: Not on file  Transportation Needs: Not on file  Physical Activity: Not on file  Stress: Not on file  Social Connections: Not on file  Depression (EYV7-0): Not on file  Alcohol Screen: Not on file  Housing: Not on file  Utilities: Not on file  Health Literacy: Not on file   Family History  Problem Relation Age of Onset   COPD Other    Cancer Other    Hypertension Other    Stroke Other    Diabetes Other     Dementia Mother    Diabetes Father        With diabetic retinopathy   Stroke Father    Stroke Sister    Other Brother        3 brothers with unknown history   Breast cancer Neg Hx    Allergies[1] Prior to Admission medications  Medication Sig Start Date End Date Taking? Authorizing Provider  ALPRAZolam (XANAX) 0.25 MG tablet Take 0.25 mg by mouth daily as needed for anxiety.  01/23/18  Yes [provider]  Calcium  Carb-Cholecalciferol (CALCIUM  600 + D PO) Take 1 tablet by mouth in the morning and at bedtime.   Yes [provider]  cloNIDine (CATAPRES) 0.1 MG tablet Take 0.1 mg by mouth every morning. 10/09/23  Yes [provider]  clotrimazole -betamethasone  (LOTRISONE ) cream Apply 1 application topically 2 (two) times daily. 08/21/19  Yes Clark-Burning, Delon, PA-C  cyclobenzaprine  (FLEXERIL ) 5 MG tablet Take 5 mg by mouth 3 (three) times daily as needed for muscle spasms. 04/29/24  Yes [provider]  desvenlafaxine (PRISTIQ) 100 MG 24 hr tablet Take 100 mg by mouth daily.   Yes [provider]  eletriptan (RELPAX) 40 MG tablet Take 40 mg by mouth every 2 (two) hours as needed for migraine. 02/08/21  Yes [provider]  estradiol (ESTRACE) 0.1 MG/GM vaginal cream Place 1 Applicatorful vaginally daily.   Yes [provider]  Multiple Vitamins-Minerals (BARIATRIC MULTIVITAMINS/IRON) CAPS Take 1 tablet by mouth daily. 04/29/18  Yes [provider]  oxyCODONE  (OXY IR/ROXICODONE ) 5 MG immediate release tablet Take 5 mg by mouth every 6 (six) hours as needed for moderate pain (pain score 4-6). 04/29/24  Yes [provider]  REXULTI 1 MG TABS tablet Take 1 mg by mouth at bedtime.   Yes [provider]  topiramate (TOPAMAX) 100 MG tablet Take 100 mg by mouth daily.   Yes [provider]  valACYclovir (VALTREX) 500 MG tablet Take 500 mg by mouth daily as needed (cold sores). 10/06/20  Yes [provider]  zolpidem  (AMBIEN ) 10 MG tablet Take 10 mg by mouth at bedtime as needed. 05/10/24  Yes [provider]  cyclobenzaprine  (FLEXERIL ) 10 MG tablet Take 1 tablet (10 mg total) by mouth 2 (two) times daily as needed for muscle spasms. Patient not taking: Reported on 05/14/2024 10/02/23   Leonce Katz, DO  levalbuterol (XOPENEX HFA) 45 MCG/ACT inhaler Inhale 1-2 puffs into the lungs every 6 (six) hours as needed for wheezing. Patient not taking: Reported on 05/14/2024 11/15/23   Mecum, Erin E, PA-C  lidocaine  (LIDODERM ) 5 % Place 1 patch onto the skin daily. Remove & Discard patch within 12 hours or as directed by MD Patient not taking: Reported on 05/14/2024 11/12/22   Victor Agent T, PA-C  lidocaine  (XYLOCAINE ) 2 % solution Use as directed 15 mLs in the mouth or throat every 8 (eight) hours as needed for mouth pain. Patient not taking: Reported on 05/14/2024 11/11/23   Mecum, Erin E, PA-C  oxyCODONE -acetaminophen  (PERCOCET/ROXICET) 5-325 MG tablet Take 1 tablet by mouth every 8 (eight) hours as needed for severe pain (pain score 7-10). Patient not taking: Reported on 05/14/2024 01/04/24   Leonce Katz, DO  promethazine -dextromethorphan (PROMETHAZINE -DM) 6.25-15 MG/5ML syrup Take 5 mLs by mouth 4 (four) times daily as needed for cough. Patient not taking: Reported on 05/14/2024 11/11/23   Mecum, Erin E, PA-C  pantoprazole  (PROTONIX ) 40 MG tablet Take 1 tablet (40 mg total) by mouth daily. 07/13/18 05/24/20  Ethyl Lenis, MD   No results found.  Positive ROS: All other systems have been reviewed and were otherwise negative with the exception of those mentioned in the HPI and as above.  Physical Exam: General: Alert, no acute distress Cardiovascular: No pedal edema Respiratory: No cyanosis, no use of accessory musculature GI: No organomegaly, abdomen is soft and non-tender Skin: No lesions in the area of chief complaint Neurologic: Sensation intact  distally Psychiatric: Patient is competent for consent with normal mood and affect Lymphatic: No axillary or cervical lymphadenopathy  MUSCULOSKELETAL: Left upper extremity is warm and well-perfused.  No open wounds or lesions.  She has neurovascular intact.  Left lower extremity is warm and well-perfused with no open wounds or lesions.  Neurovascular intact  Assessment: Left knee intra-articular cyst with synovitis.  Left shoulder adhesive capsulitis  Plan: Plan to proceed today with arthroscopy on the left knee with synovectomy and then also manipulation of the left shoulder while she is under anesthesia.  She will be full weightbearing as tolerated with activity as tolerated to both postoperatively.  She has provided informed consent.  Discharge home today from PACU.    Suzanne Belvie Gosling, MD Cell [phone]    05/24/2024 7:21 AM     [1]  Allergies Allergen Reactions   Codeine Nausea And Vomiting   Nsaids    Rizatriptan     Other Reaction(s): Unknown   "

## 2024-05-24 NOTE — Transfer of Care (Signed)
 Immediate Anesthesia Transfer of Care Note  Patient: Suzanne Gutierrez  Procedure(s) Performed: ARTHROSCOPY, KNEE, WITH SYNOVIAL CYST EXCISION OF POPLITEAL SPACE (Left: Knee) MANIPULATION, JOINT, SHOULDER, WITH ANESTHESIA (Left: Shoulder)  Patient Location: PACU  Anesthesia Type:General  Level of Consciousness: awake  Airway & Oxygen Therapy: Patient Spontanous Breathing and Patient connected to face mask oxygen  Post-op Assessment: Report given to RN and Post -op Vital signs reviewed and stable  Post vital signs: Reviewed and stable  Last Vitals:  Vitals Value Taken Time  BP 119/86 05/24/24 10:30  Temp    Pulse 94 05/24/24 10:30  Resp 14 05/24/24 10:30  SpO2 99 % 05/24/24 10:30  Vitals shown include unfiled device data.  Last Pain:  Vitals:   05/24/24 0746  TempSrc:   PainSc: 2       Patients Stated Pain Goal: 4 (05/24/24 0746)  Complications: No notable events documented.

## 2024-05-24 NOTE — Discharge Instructions (Addendum)

## 2024-05-25 ENCOUNTER — Encounter (HOSPITAL_COMMUNITY): Payer: Self-pay | Admitting: Orthopedic Surgery

## 2024-11-13 ENCOUNTER — Ambulatory Visit
